# Patient Record
Sex: Female | Born: 1935 | Race: White | Hispanic: No | Marital: Married | Smoking: Never smoker
Health system: Southern US, Community
[De-identification: ages and names within clinical notes are randomized; demographics above are authoritative.]

## PROBLEM LIST (undated history)

## (undated) DIAGNOSIS — I10 Essential (primary) hypertension: Secondary | ICD-10-CM

## (undated) DIAGNOSIS — T8859XA Other complications of anesthesia, initial encounter: Secondary | ICD-10-CM

## (undated) DIAGNOSIS — E7800 Pure hypercholesterolemia, unspecified: Secondary | ICD-10-CM

## (undated) DIAGNOSIS — F329 Major depressive disorder, single episode, unspecified: Secondary | ICD-10-CM

## (undated) DIAGNOSIS — F419 Anxiety disorder, unspecified: Secondary | ICD-10-CM

## (undated) DIAGNOSIS — R112 Nausea with vomiting, unspecified: Secondary | ICD-10-CM

## (undated) DIAGNOSIS — E119 Type 2 diabetes mellitus without complications: Secondary | ICD-10-CM

## (undated) DIAGNOSIS — Z9889 Other specified postprocedural states: Secondary | ICD-10-CM

## (undated) DIAGNOSIS — N390 Urinary tract infection, site not specified: Secondary | ICD-10-CM

## (undated) DIAGNOSIS — M1990 Unspecified osteoarthritis, unspecified site: Secondary | ICD-10-CM

## (undated) DIAGNOSIS — T4145XA Adverse effect of unspecified anesthetic, initial encounter: Secondary | ICD-10-CM

## (undated) DIAGNOSIS — F32A Depression, unspecified: Secondary | ICD-10-CM

## (undated) HISTORY — DX: Essential (primary) hypertension: I10

## (undated) HISTORY — PX: TUMOR REMOVAL: SHX12

## (undated) HISTORY — DX: Pure hypercholesterolemia, unspecified: E78.00

## (undated) HISTORY — PX: HERNIA REPAIR: SHX51

## (undated) HISTORY — PX: TOTAL ABDOMINAL HYSTERECTOMY: SHX209

## (undated) HISTORY — DX: Type 2 diabetes mellitus without complications: E11.9

## (undated) HISTORY — DX: Unspecified osteoarthritis, unspecified site: M19.90

---

## 1998-12-20 ENCOUNTER — Encounter: Admission: RE | Admit: 1998-12-20 | Discharge: 1998-12-20 | Payer: Self-pay | Admitting: Family Medicine

## 1998-12-20 ENCOUNTER — Encounter: Payer: Self-pay | Admitting: Family Medicine

## 1998-12-21 ENCOUNTER — Observation Stay (HOSPITAL_COMMUNITY): Admission: EM | Admit: 1998-12-21 | Discharge: 1998-12-22 | Payer: Self-pay

## 1998-12-21 ENCOUNTER — Encounter: Payer: Self-pay | Admitting: Anesthesiology

## 1999-01-19 ENCOUNTER — Encounter: Admission: RE | Admit: 1999-01-19 | Discharge: 1999-01-19 | Payer: Self-pay | Admitting: Family Medicine

## 1999-01-19 ENCOUNTER — Encounter: Payer: Self-pay | Admitting: Family Medicine

## 2000-04-11 ENCOUNTER — Encounter: Payer: Self-pay | Admitting: Family Medicine

## 2000-04-11 ENCOUNTER — Encounter: Admission: RE | Admit: 2000-04-11 | Discharge: 2000-04-11 | Payer: Self-pay | Admitting: Family Medicine

## 2000-07-04 ENCOUNTER — Encounter (INDEPENDENT_AMBULATORY_CARE_PROVIDER_SITE_OTHER): Payer: Self-pay | Admitting: Specialist

## 2000-07-04 ENCOUNTER — Ambulatory Visit (HOSPITAL_COMMUNITY): Admission: RE | Admit: 2000-07-04 | Discharge: 2000-07-04 | Payer: Self-pay | Admitting: *Deleted

## 2003-07-20 ENCOUNTER — Encounter: Admission: RE | Admit: 2003-07-20 | Discharge: 2003-07-20 | Payer: Self-pay | Admitting: Family Medicine

## 2003-12-09 ENCOUNTER — Ambulatory Visit: Payer: Self-pay | Admitting: Oncology

## 2005-12-11 ENCOUNTER — Ambulatory Visit (HOSPITAL_COMMUNITY): Admission: RE | Admit: 2005-12-11 | Discharge: 2005-12-11 | Payer: Self-pay | Admitting: Family Medicine

## 2005-12-11 ENCOUNTER — Encounter: Payer: Self-pay | Admitting: Vascular Surgery

## 2007-10-06 ENCOUNTER — Encounter: Admission: RE | Admit: 2007-10-06 | Discharge: 2007-10-06 | Payer: Self-pay | Admitting: Family Medicine

## 2007-10-09 ENCOUNTER — Encounter: Admission: RE | Admit: 2007-10-09 | Discharge: 2007-10-09 | Payer: Self-pay | Admitting: Family Medicine

## 2012-10-23 ENCOUNTER — Encounter: Payer: Self-pay | Admitting: Internal Medicine

## 2012-10-23 ENCOUNTER — Ambulatory Visit (INDEPENDENT_AMBULATORY_CARE_PROVIDER_SITE_OTHER): Payer: Medicare Other | Admitting: Internal Medicine

## 2012-10-23 VITALS — BP 132/82 | HR 111 | Temp 98.4°F | Ht 62.0 in | Wt 235.8 lb

## 2012-10-23 DIAGNOSIS — R05 Cough: Secondary | ICD-10-CM

## 2012-10-23 DIAGNOSIS — R918 Other nonspecific abnormal finding of lung field: Secondary | ICD-10-CM

## 2012-10-23 NOTE — Patient Instructions (Addendum)
#  CHronic cough  - use  take generic fluticasone inhaler 2 squirts each nostril daily -continue allergy pill - try 3% nasal saline OTC Spray each nostril at night - take OTC prilosec 20mg  once daily - do PFT breathing test at followup   - followup will do CAT Score   # Lung nodules  - need to look at ct 08/10/12 - find it and bring it with you at next vsiit - have CT chest wo contrast within our system OCtober 1st week 2014  #FOllowup PFT  inOCT 2014 CT chest in Oct 2014 Followup after  PFT and CT chest  - cough score at followup

## 2012-10-23 NOTE — Progress Notes (Signed)
Subjective:    Patient ID: Melissa Dalton, female    DOB: January 23, 1935, 78 y.o.   MRN: [id number] PCP Eartha Inch, MD  HPI IOV 10/23/2012   Multiple pulmonary nodules but also has chronic cough so we're dressing 2 problems   Multiple pulmonary nodules  - In July 2014 had abdominal bloating. She then underwent an abdominal scan that showed ventral hernia but also some pulmonary nodules in the base of the lung according to her history. This was followed by a CT scan of the chest at [facility] and apparently this showed nodules in the lung and there for she's been referred here. Patient left the CD-ROM of the CT scan at home. Therefore we didn't have any imaging data available today. I only have the report. The CT scan was done 08/10/2012 and interpreted by Dr. Dagoberto Reef at [facility]. Report shows numerous bilateral lung parenchymal nodules. Says the vast majority of the nodules are within the lung base and a new compared to 2006 CT scan of the chest. The largest nodule was 0.9 cm in size in the right lung base. Rest of the nodules are less than 6 mm in size. Radiologist is concerned about metastatic disease versus granulomatous disease. Radiologst noted that there was no mediastinal or hilar lymphadenopathy. In addition there is coronary atherosclerosis.     Chronic cough  - Insidious onset 30 years ago. Stable since onset. Not progressive. Usually mild but occasionally moderate to severe. Doesn't both during day and night. Largely dry cough in quality but occasionally has white mucus. Has associated postnasal drainage for which he takes daily allergy pill. She also has a constant feeling of ticklishness in her right throat. There is associated gag  Relevant history for cough   - Allergies: She would have spring and sinus allergies. Unclear she's been allergy tested  -Reflux disease: She denies any acid reflux disease. She has been seen by Dr. Loreta Ave abnormal bloating and  this had endoscopy in August 2014 apparently and this is normal.  - Respiratory history: Lifelong exposed to passive tobacco smoke because her husband smoked from [place] to 2001. But she does not have any active shortness of breath or wheezing    Dr Gretta Cool Reflux Symptom Index (> 13-15 suggestive of LPR cough) 0 -> 5  =  none ->severe problem 10/23/2012   Hoarseness of problem with voice 0  Clearing  Of Throat 3  Excess throat mucus or feeling of post nasal drip 4  Difficulty swallowing food, liquid or tablets 2  Cough after eating or lying down 4  Breathing difficulties or choking episodes 0  Troublesome or annoying cough 4  Sensation of something sticking in throat or lump in throat 2  Heartburn, chest pain, indigestion, or stomach acid coming up 3  TOTAL 22       Kouffman Reflux v Neurogenic Cough Differentiator Reflux 10/23/2012   Do you awaken from a sound sleep coughing violently?                            With trouble breathing? Yes  Do you have choking episodes when you cannot  Get enough air, gasping for air ?              No  Do you usually cough when you lie down into  The bed, or when you just lie down to rest ?  Yes  Do you usually cough after meals or eating?         Yes  Do you cough when (or after) you bend over?    no  GERD SCORE  3  Kouffman Reflux v Neurogenic Cough Differentiator Neurogenic  Do you more-or-less cough all day long? yes  Does change of temperature make you cough? no  Does laughing or chuckling cause you to cough? y  Do fumes (perfume, automobile fumes, burned  Toast, etc.,) cause you to cough ?      y  Does speaking, singing, or talking on the phone cause you to cough   ?               y  Neurogenic/Airway score 4      Past Medical History  Diagnosis Date  . HTN (hypertension)   . Diabetes   . High cholesterol   . Arthritis      Family History  Problem Relation Age of Onset  . Heart disease Father   .  Diabetes Mother      History   Social History  . Marital Status: Married    Spouse Name: N/A    Number of Children: N/A  . Years of Education: N/A   Occupational History  . retired Neurosurgeon    Social History Main Topics  . Smoking status: Never Smoker   . Smokeless tobacco: Not on file  . Alcohol Use: No  . Drug Use: No  . Sexual Activity: Not on file   Other Topics Concern  . Not on file   Social History Narrative  . No narrative on file     Allergies  Allergen Reactions  . Penicillins Hives  . Tramadol Nausea And Vomiting     No outpatient prescriptions prior to visit.   No facility-administered medications prior to visit.       Review of Systems  Constitutional: Negative for fever and unexpected weight change.  HENT: Negative for ear pain, nosebleeds, congestion, sore throat, rhinorrhea, sneezing, trouble swallowing, dental problem, postnasal drip and sinus pressure.   Eyes: Negative for redness and itching.  Respiratory: Negative for cough, chest tightness, shortness of breath and wheezing.   Cardiovascular: Negative for palpitations and leg swelling.  Gastrointestinal: Negative for nausea and vomiting.  Genitourinary: Negative for dysuria.  Musculoskeletal: Negative for joint swelling.  Skin: Negative for rash.  Neurological: Negative for headaches.  Hematological: Does not bruise/bleed easily.  Psychiatric/Behavioral: Negative for dysphoric mood. The patient is not nervous/anxious.        Objective:   Physical Exam  Vitals reviewed. Constitutional: She is oriented to person, place, and time. She appears well-developed and well-nourished. No distress.  Obese Body mass index is 43.12 kg/(m^2). Uses cane  HENT:  Head: Normocephalic and atraumatic.  Right Ear: External ear normal.  Left Ear: External ear normal.  Mouth/Throat: Oropharynx is clear and moist. No oropharyngeal exudate.  Eyes: Conjunctivae and EOM are normal. Pupils are equal,  round, and reactive to light. Right eye exhibits no discharge. Left eye exhibits no discharge. No scleral icterus.  Neck: Normal range of motion. Neck supple. No JVD present. No tracheal deviation present. No thyromegaly present.  Cardiovascular: Normal rate, regular rhythm, normal heart sounds and intact distal pulses.  Exam reveals no gallop and no friction rub.   No murmur heard. Pulmonary/Chest: Effort normal. No respiratory distress. She has no wheezes. She has rales. She exhibits no tenderness.  ? Crackles at base  Abdominal:  Soft. Bowel sounds are normal. She exhibits no distension and no mass. There is no tenderness. There is no rebound and no guarding.  Musculoskeletal: Normal range of motion. She exhibits no edema and no tenderness.  Lymphadenopathy:    She has no cervical adenopathy.  Neurological: She is alert and oriented to person, place, and time. She has normal reflexes. No cranial nerve deficit. She exhibits normal muscle tone. Coordination normal.  Skin: Skin is warm and dry. No rash noted. She is not diaphoretic. No erythema. No pallor.  Psychiatric: She has a normal mood and affect. Her behavior is normal. Judgment and thought content normal.          Assessment & Plan:

## 2012-10-25 DIAGNOSIS — R918 Other nonspecific abnormal finding of lung field: Secondary | ICD-10-CM | POA: Insufficient documentation

## 2012-10-25 DIAGNOSIS — R05 Cough: Secondary | ICD-10-CM | POA: Insufficient documentation

## 2012-10-25 NOTE — Assessment & Plan Note (Addendum)
#   Lung nodules  - need to look at ct 08/10/12 - find it and bring it with you at next vsiit - have CT chest wo contrast within our system OCtober 1st week 2014; this will beh followup scan for the nodule because prior one reported to have 9mm nodule

## 2012-10-25 NOTE — Assessment & Plan Note (Signed)
#  CHronic cough  - use  take generic fluticasone inhaler 2 squirts each nostril daily -continue allergy pill - try 3% nasal saline OTC Spray each nostril at night - take OTC prilosec 20mg  once daily - do PFT breathing test at followup   - followup will do CAT Score    #FOllowup PFT  inOCT 2014 CT chest in Oct 2014 Followup after  PFT and CT chest  - cough score at followup

## 2012-11-10 ENCOUNTER — Telehealth: Payer: Self-pay | Admitting: Internal Medicine

## 2012-11-10 ENCOUNTER — Ambulatory Visit (INDEPENDENT_AMBULATORY_CARE_PROVIDER_SITE_OTHER)
Admission: RE | Admit: 2012-11-10 | Discharge: 2012-11-10 | Disposition: A | Discharge status: Home / Self Care | Payer: Medicare Other | Source: Ambulatory Visit | Attending: Internal Medicine | Admitting: Internal Medicine

## 2012-11-10 DIAGNOSIS — R053 Chronic cough: Secondary | ICD-10-CM

## 2012-11-10 DIAGNOSIS — R918 Other nonspecific abnormal finding of lung field: Secondary | ICD-10-CM

## 2012-11-10 DIAGNOSIS — R05 Cough: Secondary | ICD-10-CM

## 2012-11-10 NOTE — Telephone Encounter (Signed)
Dr Llana Aliment called. He wants the CD Rom from [place] that the patient left behind when she came to see me. Have her bring it in so I can take it to him. Let me know when done  Thanks  Dr. Kalman Shan, M.D., [facility].C.P Pulmonary and Critical Care Medicine Staff Physician [place] [facility] [place] [facility] Critical [facility] Pager: [phone], If no answer or between  15:00h - 7:00h: call [phone]  11/10/2012 9:57 AM

## 2012-11-11 NOTE — Telephone Encounter (Signed)
Pt brought CD-rom by the office. I have them with me so they do not get lost. Let me know what you would like me to do with them. Thanks!

## 2012-11-11 NOTE — Telephone Encounter (Signed)
I spoke with the pt and she states she does not know when she will be able to come into [place]. She states she lives in [place] and that is far away and she does not come into [place] often. I advised of the importance to bring this so the doctor can properly treat her. I asked the pt to call me when she thinks she can bring it by. She could not give me an estimated date. Carron Curie, CMA

## 2012-11-12 NOTE — Telephone Encounter (Signed)
I placed it there. Carron Curie, CMA

## 2012-11-12 NOTE — Telephone Encounter (Signed)
Please leave it on top of the computer on wheels   Dr. Kalman Shan, M.D., [facility].C.P Pulmonary and Critical Care Medicine Staff Physician [place] [facility] [place] [facility]monary and Critical Care Pager: [phone], If no answer or between  15:00h - 7:00h: call [phone]  11/12/2012 8:18 AM

## 2012-12-04 ENCOUNTER — Telehealth: Payer: Self-pay | Admitting: Internal Medicine

## 2012-12-04 NOTE — Telephone Encounter (Signed)
I have her out of town CD on the [facility]. DR ENtrikin told me that system can upload. COuld you (in my time off) get it over to [facility] and have them up load it. Can you or [facility] do that? LEt me know  Thanks  Dr. Kalman Shan, M.D., [facility].C.P Pulmonary and Critical Care Medicine Staff Physician [place] [facility] [place] [facility]monary and Critical Care Pager: [phone], If no answer or between  15:00h - 7:00h: call [phone]  12/04/2012 2:54 PM

## 2012-12-07 ENCOUNTER — Ambulatory Visit: Payer: Medicare Other | Admitting: Internal Medicine

## 2012-12-14 ENCOUNTER — Telehealth: Payer: Self-pay | Admitting: Internal Medicine

## 2012-12-14 ENCOUNTER — Inpatient Hospital Stay
Admission: RE | Admit: 2012-12-14 | Discharge: 2012-12-14 | Disposition: A | Discharge status: Home / Self Care | Payer: Self-pay | Source: Ambulatory Visit | Attending: Internal Medicine | Admitting: Internal Medicine

## 2012-12-14 ENCOUNTER — Other Ambulatory Visit: Payer: Self-pay | Admitting: Internal Medicine

## 2012-12-14 DIAGNOSIS — I10 Essential (primary) hypertension: Secondary | ICD-10-CM

## 2012-12-14 NOTE — Telephone Encounter (Signed)
Spoke with Melissa Dalton. Per Victorino Dike, we would like to have the CD back. Advised that one of [place] will come by and get it. Nothing further is needed at this time.

## 2012-12-14 NOTE — Telephone Encounter (Signed)
I took CD to [facility] today. Carron Curie, CMA

## 2012-12-17 ENCOUNTER — Encounter: Payer: Self-pay | Admitting: Internal Medicine

## 2012-12-17 ENCOUNTER — Ambulatory Visit (INDEPENDENT_AMBULATORY_CARE_PROVIDER_SITE_OTHER): Payer: Medicare Other | Admitting: Internal Medicine

## 2012-12-17 VITALS — BP 132/82 | HR 112 | Temp 98.0°F | Ht 61.0 in | Wt 238.0 lb

## 2012-12-17 DIAGNOSIS — I2584 Coronary atherosclerosis due to calcified coronary lesion: Secondary | ICD-10-CM

## 2012-12-17 DIAGNOSIS — R05 Cough: Secondary | ICD-10-CM

## 2012-12-17 DIAGNOSIS — R053 Chronic cough: Secondary | ICD-10-CM

## 2012-12-17 DIAGNOSIS — I2510 Atherosclerotic heart disease of native coronary artery without angina pectoris: Secondary | ICD-10-CM

## 2012-12-17 DIAGNOSIS — R918 Other nonspecific abnormal finding of lung field: Secondary | ICD-10-CM

## 2012-12-17 LAB — PULMONARY FUNCTION TEST

## 2012-12-17 NOTE — Progress Notes (Signed)
Subjective:    Patient ID: Melissa Dalton, female    DOB: 1935/02/01, 78 y.o.   MRN: [id number]  HPI  PCP BADGER,MICHAEL C, MD  HPI IOV 10/23/2012   Multiple pulmonary nodules but also has chronic cough so we're dressing 2 problems   Multiple pulmonary nodules  - In July 2014 had abdominal bloating. She then underwent an abdominal scan that showed ventral hernia but also some pulmonary nodules in the base of the lung according to her history. This was followed by a CT scan of the chest at [facility] and apparently this showed nodules in the lung and there for she's been referred here. Patient left the CD-ROM of the CT scan at home. Therefore we didn't have any imaging data available today. I only have the report. The CT scan was done 08/10/2012 and interpreted by Dr. Dagoberto Reef at [facility]. Report shows numerous bilateral lung parenchymal nodules. Says the vast majority of the nodules are within the lung base and a new compared to 2006 CT scan of the chest. The largest nodule was 0.9 cm in size in the right lung base. Rest of the nodules are less than 6 mm in size. Radiologist is concerned about metastatic disease versus granulomatous disease. Radiologst noted that there was no mediastinal or hilar lymphadenopathy. In addition there is coronary atherosclerosis.     Chronic cough  - Insidious onset 30 years ago. Stable since onset. Not progressive. Usually mild but occasionally moderate to severe. Doesn't both during day and night. Largely dry cough in quality but occasionally has white mucus. Has associated postnasal drainage for which he takes daily allergy pill. She also has a constant feeling of ticklishness in her right throat. There is associated gag. RSI cough score is 22 suggesting irritable larynx syndrome. Cough differentiator score is highfor both acid reflux and airway/neurogenic cough  Relevant history for cough   - Allergies: She would have spring and  sinus allergies. Unclear she's been allergy tested  -Reflux disease: She denies any acid reflux disease. She has been seen by Dr. Loreta Ave abnormal bloating and this had endoscopy in August 2014 apparently and this is normal.  - Respiratory history: Lifelong exposed to passive tobacco smoke because her husband smoked from [place] to 2001. But she does not have any active shortness of breath or wheezing    OV 12/17/2012  Followup chronic cough.   Last visit we added sinus drainage treatment with nasal steroid and 3% hypertonic saline. In addition added acid reflux treatment with Prilosec. However, despite this she is no better. Cough is unchanged. Cough is typically worse at night when she lies down.  We have additional test results.   Pulmonary function test 12/17/2012 - normal except restrictio due to obesity shows FVC of 2.17 L/89%. FEV1 of 1.8 L/100%. Now broken a letter response. Ratio of 83 and normal. Total lung capacity 3.25 L/70%. Diffusion capacity 18.36/90. Essentially normal pulmonary function test except for mild reduction in total lung capacity suggestive of restriction Body mass index is 44.99 kg/(m^2).    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 - Coronary artery calcification -  Hepatic steatosis    Dr Gretta Cool Reflux Symptom Index (> 13-15 suggestive of LPR cough) 0 -> 5  =  none ->severe problem 10/23/2012   Hoarseness of problem with voice 0  Clearing  Of Throat 3  Excess throat mucus  or feeling of post nasal drip 4  Difficulty swallowing food, liquid or tablets 2  Cough after eating or lying down 4  Breathing difficulties or choking episodes 0  Troublesome or annoying cough 4  Sensation of something sticking in throat or lump in throat 2  Heartburn, chest pain, indigestion, or stomach acid coming up 3  TOTAL 22       Kouffman Reflux v Neurogenic Cough  Differentiator Reflux 10/23/2012   Do you awaken from a sound sleep coughing violently?                            With trouble breathing? Yes  Do you have choking episodes when you cannot  Get enough air, gasping for air ?              No  Do you usually cough when you lie down into  The bed, or when you just lie down to rest ?                          Yes  Do you usually cough after meals or eating?         Yes  Do you cough when (or after) you bend over?    no  GERD SCORE  3  Kouffman Reflux v Neurogenic Cough Differentiator Neurogenic  Do you more-or-less cough all day long? yes  Does change of temperature make you cough? no  Does laughing or chuckling cause you to cough? y  Do fumes (perfume, automobile fumes, burned  Toast, etc.,) cause you to cough ?      y  Does speaking, singing, or talking on the phone cause you to cough   ?               y  Neurogenic/Airway score 4        Review of Systems  Constitutional: Negative for fever and unexpected weight change.  HENT: Negative for congestion, dental problem, ear pain, nosebleeds, postnasal drip, rhinorrhea, sinus pressure, sneezing, sore throat and trouble swallowing.   Eyes: Negative for redness and itching.  Respiratory: Positive for cough. Negative for chest tightness, shortness of breath and wheezing.   Cardiovascular: Negative for palpitations and leg swelling.  Gastrointestinal: Negative for nausea and vomiting.  Genitourinary: Negative for dysuria.  Musculoskeletal: Negative for joint swelling.  Skin: Negative for rash.  Neurological: Negative for headaches.  Hematological: Does not bruise/bleed easily.  Psychiatric/Behavioral: Negative for dysphoric mood. The patient is not nervous/anxious.        Objective:   Physical Exam  Vitals reviewed. Constitutional: She is oriented to person, place, and time. She appears well-developed and well-nourished. No distress.  Obese Has cane  HENT:  Head: Normocephalic and  atraumatic.  Right Ear: External ear normal.  Left Ear: External ear normal.  Mouth/Throat: Oropharynx is clear and moist. No oropharyngeal exudate.  Eyes: Conjunctivae and EOM are normal. Pupils are equal, round, and reactive to light. Right eye exhibits no discharge. Left eye exhibits no discharge. No scleral icterus.  Neck: Normal range of motion. Neck supple. No JVD present. No tracheal deviation present. No thyromegaly present.  Cardiovascular: Normal rate, regular rhythm, normal heart sounds and intact distal pulses.  Exam reveals no gallop and no friction rub.   No murmur heard. Pulmonary/Chest: Effort normal and breath sounds normal. No respiratory distress. She has no wheezes. She has no rales. She  exhibits no tenderness.  Abdominal: Soft. Bowel sounds are normal. She exhibits no distension and no mass. There is no tenderness. There is no rebound and no guarding.  Musculoskeletal: Normal range of motion. She exhibits no edema and no tenderness.  Lymphadenopathy:    She has no cervical adenopathy.  Neurological: She is alert and oriented to person, place, and time. She has normal reflexes. No cranial nerve deficit. She exhibits normal muscle tone. Coordination normal.  Skin: Skin is warm and dry. No rash noted. She is not diaphoretic. No erythema. No pallor.  Psychiatric: She has a normal mood and affect. Her behavior is normal. Judgment and thought content normal.          Assessment & Plan:

## 2012-12-17 NOTE — Patient Instructions (Addendum)
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 Only  Dr Leanna Battles or Dr. Trudie Reed to read   #Co Art Calcification  - appreciate you telling me you are overwhelmed and want to hold off cardiology evaluation for now  #FOllowup 6 weeks  cough score at followup

## 2012-12-17 NOTE — Progress Notes (Signed)
PFT done today. 

## 2012-12-20 DIAGNOSIS — I2510 Atherosclerotic heart disease of native coronary artery without angina pectoris: Secondary | ICD-10-CM | POA: Insufficient documentation

## 2012-12-20 NOTE — Assessment & Plan Note (Signed)
 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 Only  Dr Leanna Battles or Dr. Trudie Reed to read   #FOllowup 6 weeks  cough score at followup

## 2012-12-20 NOTE — Assessment & Plan Note (Signed)
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

## 2012-12-20 NOTE — Assessment & Plan Note (Addendum)
 #  Co Art Calcification  - appreciate you telling me you are overwhelmed and want to hold off cardiology evaluation for now  #FOllowup 6 weeks  cough score at followup

## 2013-02-08 ENCOUNTER — Encounter: Payer: Self-pay | Admitting: Internal Medicine

## 2013-02-08 ENCOUNTER — Ambulatory Visit (INDEPENDENT_AMBULATORY_CARE_PROVIDER_SITE_OTHER): Payer: Medicare Other | Admitting: Internal Medicine

## 2013-02-08 VITALS — BP 130/82 | HR 99 | Ht 61.0 in | Wt 242.0 lb

## 2013-02-08 DIAGNOSIS — R059 Cough, unspecified: Secondary | ICD-10-CM

## 2013-02-08 DIAGNOSIS — R053 Chronic cough: Secondary | ICD-10-CM

## 2013-02-08 DIAGNOSIS — R05 Cough: Secondary | ICD-10-CM

## 2013-02-08 MED ORDER — RANITIDINE HCL 300 MG PO CAPS: 300.0000 mg | ORAL_CAPSULE | Freq: Every evening | ORAL | Status: DC

## 2013-02-08 MED ORDER — PANTOPRAZOLE SODIUM 40 MG PO TBEC: 40.0000 mg | DELAYED_RELEASE_TABLET | Freq: Every day | ORAL | Status: DC

## 2013-02-08 MED ORDER — VALSARTAN 80 MG PO TABS: 80.0000 mg | ORAL_TABLET | Freq: Every day | ORAL | Status: DC

## 2013-02-08 NOTE — Patient Instructions (Addendum)
#  CHronic cough - multiple reasons + o a rare condition called DIPNECH; seen as nodules in lung - too bad ytou are not any better - respect your desire to avoid further testing or adding more new medications PLAN - continue AEROSPAN, 2 puff twice daily  - continue  generic fluticasone inhaler 2 squirts each nostril daily -continue allergy pill - contiue 3% nasal saline OTC Spray each nostril at night - CHANGE OTC prilosec 20mg  once daily to [place] 40mg  daily  - take on empty stomach at day time - START RANITIDINE 300mg  once a day at bedtime - CHANGE LOSARTAN BP pill to VALSARTAN 80mg  once a day  - monitor your BP - Respect your desire to holdd of speech Rx and neurontin for cough - Respect your desire to hold off ENT, surgical lung bx referral - followup do Cough score - part 1  # Lung nodules  -  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 Only  Dr Lorin Picket or Dr. Vinnie Langton to read   #Co Art Calcification  - appreciate you telling me you that you continue to fell overwhelmed and want to hold off cardiology evaluation for now  #FOllowup 6 weeks  cough score at followup

## 2013-02-08 NOTE — Assessment & Plan Note (Signed)
#  CHronic cough - multiple reasons + o a rare condition called DIPNECH; seen as nodules in lung - too bad ytou are not any better - respect your desire to avoid further testing or adding more new medications PLAN - continue AEROSPAN, 2 puff twice daily  - continue  generic fluticasone inhaler 2 squirts each nostril daily -continue allergy pill - contiue 3% nasal saline OTC Spray each nostril at night - CHANGE OTC prilosec 20mg  once daily to [place] 40mg  daily  - take on empty stomach at day time - START RANITIDINE 300mg  once a day at bedtime - CHANGE LOSARTAN BP pill to VALSARTAN 80mg  once a day  - monitor your BP - Respect your desire to holdd of speech Rx and neurontin for cough - Respect your desire to hold off ENT, surgical lung bx referral - followup do Cough score - part 1  # Lung nodules  -  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 Only  Dr Lorin Picket or Dr. Vinnie Langton to read   #Co Art Calcification  - appreciate you telling me you that you continue to fell overwhelmed and want to hold off cardiology evaluation for now  #FOllowup 6 weeks  cough score at followup  > 50% of this > 25 min visit spent in face to face counseling (15 min visit converted to 25 min)

## 2013-02-08 NOTE — Progress Notes (Signed)
Subjective:    Patient ID: Melissa Dalton, female    DOB: [birth year], 79 y.o.   MRN: [id number]  HPI  PCP BADGER,MICHAEL C, MD  HPI IOV 10/23/2012   Multiple pulmonary nodules but also has chronic cough so we're dressing 2 problems   Multiple pulmonary nodules  - In July 2014 had abdominal bloating. She then underwent an abdominal scan that showed ventral hernia but also some pulmonary nodules in the base of the lung according to her history. This was followed by a CT scan of the chest at [facility] and apparently this showed nodules in the lung and there for she's been referred here. Patient left the CD-ROM of the CT scan at home. Therefore we didn't have any imaging data available today. I only have the report. The CT scan was done 08/10/2012 and interpreted by Dr. Ophelia Charter at [facility]. Report shows numerous bilateral lung parenchymal nodules. Says the vast majority of the nodules are within the lung base and a new compared to 2006 CT scan of the chest. The largest nodule was 0.9 cm in size in the right lung base. Rest of the nodules are less than 6 mm in size. Radiologist is concerned about metastatic disease versus granulomatous disease. Radiologst noted that there was no mediastinal or hilar lymphadenopathy. In addition there is coronary atherosclerosis.     Chronic cough  - Insidious onset 30 years ago. Stable since onset. Not progressive. Usually mild but occasionally moderate to severe. Doesn't both during day and night. Largely dry cough in quality but occasionally has white mucus. Has associated postnasal drainage for which he takes daily allergy pill. She also has a constant feeling of ticklishness in her right throat. There is associated gag. RSI cough score is 22 suggesting irritable larynx syndrome. Cough differentiator score is highfor both acid reflux and airway/neurogenic cough  Relevant history for cough   - Allergies: She would have spring and  sinus allergies. Unclear she's been allergy tested  -Reflux disease: She denies any acid reflux disease. She has been seen by Dr. Collene Mares abnormal bloating and this had endoscopy in August 2014 apparently and this is normal.  - Respiratory history: Lifelong exposed to passive tobacco smoke because her husband smoked from [place] to 2001. But she does not have any active shortness of breath or wheezing    OV 12/17/2012  Followup chronic cough.   Last visit we added sinus drainage treatment with nasal steroid and 3% hypertonic saline. In addition added acid reflux treatment with Prilosec. However, despite this she is no better. Cough is unchanged. Cough is typically worse at night when she lies down.  We have additional test results.   Pulmonary function test 12/17/2012 - normal except restrictio due to obesity shows FVC of 2.17 L/89%. FEV1 of 1.8 L/100%. Now broken a letter response. Ratio of 83 and normal. Total lung capacity 3.25 L/70%. Diffusion capacity 18.36/90. Essentially normal pulmonary function test except for mild reduction in total lung capacity suggestive of restriction Body mass index is 44.99 kg/(m^2).    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  Only  Dr Lorin Picket or Dr. Vinnie Langton to read   #Co Art Calcification  - appreciate you telling me you are overwhelmed and want to hold off cardiology evaluation for now  #FOllowup 6 weeks  cough score at followup   OV 02/08/2013  Fu chronic cough  - NO better. RSI cough score is 21 and unchanged. This is despite sinus, Gerd and oral ICS. She is against further testing. Says she would rather live with cough. She is againset speech Rx referral. SHe is against neurontin. Alll atlteast at this stge. She has refused flu shot. She is against surgical lung bx for nodules. She is open to changing GERD Rx. She is open to changing her ARB losartan to generid diovan    Dr Lorenza Cambridge Reflux Symptom Index (> 13-15 suggestive of LPR cough) 0 -> 5  =  none ->severe problem 10/23/2012  02/08/2013   Hoarseness of problem with voice 0 0  Clearing  Of Throat 3 4  Excess throat mucus or feeling of post nasal drip 4 4  Difficulty swallowing food, liquid or tablets 2 1  Cough after eating or lying down 4 4  Breathing difficulties or choking episodes 0 0  Troublesome or annoying cough 4 4  Sensation of something sticking in throat or lump in throat 2 1  Heartburn, chest pain, indigestion, or stomach acid coming up 3 3  TOTAL 22 21       Kouffman Reflux v Neurogenic Cough Differentiator Reflux 10/23/2012  02/08/2013   Do you awaken from a sound sleep coughing violently?                            With trouble breathing? Yes   Do you have choking episodes when you cannot  Get enough air, gasping for air ?              No   Do you usually cough when you lie down into  The bed, or when you just lie down to rest ?                          Yes   Do you usually cough after meals or eating?         Yes   Do you cough when (or after) you bend over?    no   GERD SCORE  3   Kouffman Reflux v Neurogenic Cough Differentiator Neurogenic   Do you more-or-less cough all day long? yes   Does change of  temperature make you cough? no   Does laughing or chuckling cause you to cough? y   Do fumes (perfume, automobile fumes, burned  Toast, etc.,) cause you to cough ?      y   Does speaking, singing, or talking on the phone cause you to cough   ?               y   Neurogenic/Airway score 4  Review of Systems  Constitutional: Negative for fever and unexpected weight change.  HENT: Negative for congestion, dental problem, ear pain, nosebleeds, postnasal drip, rhinorrhea, sinus pressure, sneezing, sore throat and trouble swallowing.   Eyes: Negative for redness and itching.  Respiratory: Positive for cough. Negative for chest tightness, shortness of breath and wheezing.   Cardiovascular: Negative for palpitations and leg swelling.  Gastrointestinal: Negative for nausea and vomiting.  Genitourinary: Negative for dysuria.  Musculoskeletal: Negative for joint swelling.  Skin: Negative for rash.  Neurological: Negative for headaches.  Hematological: Does not bruise/bleed easily.  Psychiatric/Behavioral: Negative for dysphoric mood. The patient is not nervous/anxious.        Objective:   Physical Exam Vitals reviewed. Constitutional: She is oriented to person, place, and time. She appears well-developed and well-nourished. No distress.  Obese Has cane  HENT:  Head: Normocephalic and atraumatic.  Right Ear: External ear normal.  Left Ear: External ear normal.  Mouth/Throat: Oropharynx is clear and moist. No oropharyngeal exudate.  Eyes: Conjunctivae and EOM are normal. Pupils are equal, round, and reactive to light. Right eye exhibits no discharge. Left eye exhibits no discharge. No scleral icterus.  Neck: Normal range of motion. Neck supple. No JVD present. No tracheal deviation present. No thyromegaly present.  Cardiovascular: Normal rate, regular rhythm, normal heart sounds and intact distal pulses.  Exam reveals no gallop and no friction rub.   No murmur  heard. Pulmonary/Chest: Effort normal and breath sounds normal. No respiratory distress. She has no wheezes. She has no rales. She exhibits no tenderness.  Abdominal: Soft. Bowel sounds are normal. She exhibits no distension and no mass. There is no tenderness. There is no rebound and no guarding.  Musculoskeletal: Normal range of motion. She exhibits no edema and no tenderness.  Lymphadenopathy:    She has no cervical adenopathy.  Neurological: She is alert and oriented to person, place, and time. She has normal reflexes. No cranial nerve deficit. She exhibits normal muscle tone. Coordination normal.  Skin: Skin is warm and dry. No rash noted. She is not diaphoretic. No erythema. No pallor.  Psychiatric: She has a normal mood and affect. Her behavior is normal. Judgment and thought content normal.          Assessment & Plan:

## 2013-03-23 ENCOUNTER — Ambulatory Visit: Payer: Medicare Other | Admitting: Internal Medicine

## 2013-04-23 ENCOUNTER — Encounter: Payer: Self-pay | Admitting: Internal Medicine

## 2013-04-23 ENCOUNTER — Ambulatory Visit (INDEPENDENT_AMBULATORY_CARE_PROVIDER_SITE_OTHER): Payer: Medicare Other | Admitting: Internal Medicine

## 2013-04-23 VITALS — BP 128/82 | HR 100 | Ht 61.0 in | Wt 242.2 lb

## 2013-04-23 DIAGNOSIS — R059 Cough, unspecified: Secondary | ICD-10-CM

## 2013-04-23 DIAGNOSIS — R918 Other nonspecific abnormal finding of lung field: Secondary | ICD-10-CM

## 2013-04-23 DIAGNOSIS — R05 Cough: Secondary | ICD-10-CM

## 2013-04-23 DIAGNOSIS — I2510 Atherosclerotic heart disease of native coronary artery without angina pectoris: Secondary | ICD-10-CM

## 2013-04-23 DIAGNOSIS — R911 Solitary pulmonary nodule: Secondary | ICD-10-CM

## 2013-04-23 DIAGNOSIS — R053 Chronic cough: Secondary | ICD-10-CM

## 2013-04-23 DIAGNOSIS — I2584 Coronary atherosclerosis due to calcified coronary lesion: Secondary | ICD-10-CM

## 2013-04-23 NOTE — Assessment & Plan Note (Signed)
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; seen as nodules in lung - too bad you are not any better despite changes to bp med and acid reflux med  - respect your desire to avoid further testing or adding more new medications  PLAN - continue AEROSPAN, 2 puff twice daily  - continue  generic fluticasone inhaler 2 squirts each nostril daily -continue allergy pill - contiue 3% nasal saline OTC Spray each nostril at night - continue daily to [place] 40mg  daily  - take on empty stomach at day time - STOP  RANITIDINE 300mg  once a day at bedtime - Continue  VALSARTAN 80mg  once a day  - monitor your BP - Respect your desire to holdd of speech Rx and neurontin for cough - Respect your desire to hold off ENT, surgical lung bx referral - followup do Cough score - part 1  # Lung nodules  -  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 Only  Dr Lorin Picket or Dr. Vinnie Langton to read    Discussed and decided we will not address cough anymore

## 2013-04-23 NOTE — Patient Instructions (Addendum)
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; seen as nodules in lung - too bad you are not any better despite changes to bp med and acid reflux med  - respect your desire to avoid further testing or adding more new medications  PLAN - continue AEROSPAN, 2 puff twice daily  - continue  generic fluticasone inhaler 2 squirts each nostril daily -continue allergy pill - contiue 3% nasal saline OTC Spray each nostril at night - continue daily to [place] 40mg  daily  - take on empty stomach at day time - STOP  RANITIDINE 300mg  once a day at bedtime - Continue  VALSARTAN 80mg  once a day  - monitor your BP - Respect your desire to holdd of speech Rx and neurontin for cough - Respect your desire to hold off ENT, surgical lung bx referral - followup do Cough score - part 1  # Lung nodules  -  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 Only  Dr Lorin Picket or Dr. Vinnie Langton to read   #Co Art Calcification  - appreciate you telling me you that you continue to fell overwhelmed and want to hold off cardiology evaluation for now  #FOllowup July 2015 after CT chest for nodules

## 2013-04-23 NOTE — Assessment & Plan Note (Signed)
#  FOllowup July 2015 after CT chest for nodules

## 2013-04-23 NOTE — Progress Notes (Signed)
Subjective:    Patient ID: Melissa Dalton, female    DOB: [birth year], 79 y.o.   MRN: [id number]  HPI   PCP Chesley Noon, MD  HPI IOV 10/23/2012   Multiple pulmonary nodules but also has chronic cough so we're adressing 2 problems   Multiple pulmonary nodules  - In July 2014 had abdominal bloating. She then underwent an abdominal scan that showed ventral hernia but also some pulmonary nodules in the base of the lung according to her history. This was followed by a CT scan of the chest at [facility] and apparently this showed nodules in the lung and there for she's been referred here. Patient left the CD-ROM of the CT scan at home. Therefore we didn't have any imaging data available today. I only have the report. The CT scan was done 08/10/2012 and interpreted by Dr. Ophelia Charter at [facility]. Report shows numerous bilateral lung parenchymal nodules. Says the vast majority of the nodules are within the lung base and a new compared to 2006 CT scan of the chest. The largest nodule was 0.9 cm in size in the right lung base. Rest of the nodules are less than 6 mm in size. Radiologist is concerned about metastatic disease versus granulomatous disease. Radiologst noted that there was no mediastinal or hilar lymphadenopathy. In addition there is coronary atherosclerosis.     Chronic cough  - Insidious onset 30 years ago. Stable since onset. Not progressive. Usually mild but occasionally moderate to severe. Doesn't both during day and night. Largely dry cough in quality but occasionally has white mucus. Has associated postnasal drainage for which he takes daily allergy pill. She also has a constant feeling of ticklishness in her right throat. There is associated gag. RSI cough score is 22 suggesting irritable larynx syndrome. Cough differentiator score is highfor both acid reflux and airway/neurogenic cough  Relevant history for cough   - Allergies: She would have spring and  sinus allergies. Unclear she's been allergy tested  -Reflux disease: She denies any acid reflux disease. She has been seen by Dr. Collene Mares abnormal bloating and this had endoscopy in August 2014 apparently and this is normal.  - Respiratory history: Lifelong exposed to passive tobacco smoke because her husband smoked from [place] to 2001. But she does not have any active shortness of breath or wheezing    OV 12/17/2012  Followup chronic cough.   Last visit we added sinus drainage treatment with nasal steroid and 3% hypertonic saline. In addition added acid reflux treatment with Prilosec. However, despite this she is no better. Cough is unchanged. Cough is typically worse at night when she lies down.  We have additional test results.   Pulmonary function test 12/17/2012 - normal except restrictio due to obesity shows FVC of 2.17 L/89%. FEV1 of 1.8 L/100%. Now broken a letter response. Ratio of 83 and normal. Total lung capacity 3.25 L/70%. Diffusion capacity 18.36/90. Essentially normal pulmonary function test except for mild reduction in total lung capacity suggestive of restriction Body mass index is 44.99 kg/(m^2).    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  Only  Dr Lorin Picket or Dr. Vinnie Langton to read   #Co Art Calcification  - appreciate you telling me you are overwhelmed and want to hold off cardiology evaluation for now  #FOllowup 6 weeks  cough score at followup   OV 02/08/2013  Fu chronic cough  - NO better. RSI cough score is 21 and unchanged. This is despite sinus, Gerd and oral ICS. She is against further testing. Says she would rather live with cough. She is againset speech Rx referral. SHe is against neurontin.  She has refused flu shot. She is against surgical lung bx for nodules. She is open to changing GERD Rx. She is open to changing her ARB losartan to generid diovan  #CHronic cough - multiple reasons + o a rare condition called DIPNECH; seen as nodules in lung - too bad ytou are not any better - respect your desire to avoid further testing or adding more new medications  PLAN - continue AEROSPAN, 2 puff twice daily  - continue  generic fluticasone inhaler 2 squirts each nostril daily -continue allergy pill - contiue 3% nasal saline OTC Spray each nostril at night - CHANGE OTC prilosec 20mg  once daily to [place] 40mg  daily  - take on empty stomach at day time - START RANITIDINE 300mg  once a day at bedtime - CHANGE LOSARTAN BP pill to VALSARTAN 80mg  once a day  - monitor your BP - Respect your desire to holdd of speech Rx and neurontin for cough - Respect your desire to hold off ENT, surgical lung bx referral - followup do Cough score - part 1  # Lung nodules  -  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 Only  Dr Lorin Picket or Dr. Vinnie Langton to read   #Co Art Calcification  - appreciate you telling me you that you continue to fell overwhelmed and want to hold off cardiology evaluation for now  #FOllowup 6 weeks  cough score at  followup   [place] 04/23/2013  Chief Complaint  Patient presents with  . Cough    follow-up. Pt states cough is not any better.     Followup chronic cough  At last visit she is reluctant to adopt measures for irritable larynx syndrome of vocal cord "dysfunction as cause of cough. Namely she did not want that and speech therapy or try Neurontin or undergo surgical lung biopsy. She opted for changing her proton pump inhibitor, adding ranitidine for acid reflux. She also agreed to change her blood pressure pill to valsartan from losartan. In addition she continued Aerospan and sinus drainage measures. Despite this her cough has not improved. She states that she's had cough now for 35 years and would rather live with a cough that I doubt any new measures for treatment. She's not interested in surgical lung biopsy or Neurontin or speech therapy.  Past medical history reviewed 1 lung  nodules nine-month followup scan is pending July 2015 she is agreed for this #2. Coronary artery calcification she again declined cardiology evaluation for this  Dr Lorenza Cambridge Reflux Symptom Index (> 13-15 suggestive of LPR cough) 0 -> 5  =  none ->severe problem 10/23/2012  02/08/2013  04/23/2013   Hoarseness of problem with voice 0 0 0  Clearing  Of Throat 3 4 3   Excess throat mucus or feeling of post nasal drip 4 4 3   Difficulty swallowing food, liquid or tablets 2 1 1   Cough after eating or lying down 4 4 3   Breathing difficulties or choking episodes 0 0 0  Troublesome or annoying cough 4 4 3   Sensation of something sticking in throat or lump in throat 2 1 1   Heartburn, chest pain, indigestion, or stomach acid coming up 3 3 3   TOTAL 22 21 15              Review of Systems  Constitutional: Negative for fever and unexpected weight change.  HENT: Negative for congestion, dental problem, ear pain, nosebleeds, postnasal drip, rhinorrhea, sinus pressure, sneezing, sore throat and trouble swallowing.   Eyes:  Negative for redness and itching.  Respiratory: Positive for cough. Negative for chest tightness, shortness of breath and wheezing.   Cardiovascular: Negative for palpitations and leg swelling.  Gastrointestinal: Negative for nausea and vomiting.  Genitourinary: Negative for dysuria.  Musculoskeletal: Negative for joint swelling.  Skin: Negative for rash.  Neurological: Negative for headaches.  Hematological: Does not bruise/bleed easily.  Psychiatric/Behavioral: Negative for dysphoric mood. The patient is not nervous/anxious.        Objective:   Physical Exam  Vitals reviewed. Constitutional: She is oriented to person, place, and time. She appears well-developed and well-nourished. No distress.  Body mass index is 45.79 kg/(m^2).   HENT:  Head: Normocephalic and atraumatic.  Right Ear: External ear normal.  Left Ear: External ear normal.  Mouth/Throat: Oropharynx is clear and moist. No oropharyngeal exudate.  Eyes: Conjunctivae and EOM are normal. Pupils are equal, round, and reactive to light. Right eye exhibits no discharge. Left eye exhibits no discharge. No scleral icterus.  Neck: Normal range of motion. Neck supple. No JVD present. No tracheal deviation present. No thyromegaly present.  Cardiovascular: Normal rate, regular rhythm, normal heart sounds and intact distal pulses.  Exam reveals no gallop and no friction rub.   No murmur heard. Pulmonary/Chest: Effort normal. No respiratory distress. She has no wheezes. She has rales. She exhibits no tenderness.  Bilateral LL rales  Abdominal: Soft. Bowel sounds are normal. She exhibits no distension and no mass. There is no tenderness. There is no rebound and no guarding.  Musculoskeletal: Normal range of motion. She exhibits no edema and no tenderness.  DJD  Lymphadenopathy:    She has no cervical adenopathy.  Neurological: She is alert and oriented to person, place, and time. She has normal reflexes. No cranial nerve deficit.  She exhibits normal muscle tone. Coordination normal.  Skin: Skin is warm and dry. No rash noted. She is not diaphoretic. No erythema.  Psychiatric: She has a normal mood and affect. Her behavior is normal. Judgment and thought content normal.   Filed Vitals:   04/23/13 0858  BP: 128/82  Pulse: 100  Height: 5\' 1"  (1.549 m)  Weight: 242 lb 3.2 oz (109.861 kg)  SpO2: 95%          Assessment & Plan:

## 2013-04-23 NOTE — Assessment & Plan Note (Signed)
#  Co Art Calcification  - appreciate you telling me you that you continue to fell overwhelmed and want to hold off cardiology evaluation for now

## 2013-10-04 ENCOUNTER — Other Ambulatory Visit: Payer: Medicare Other

## 2013-10-07 ENCOUNTER — Ambulatory Visit (INDEPENDENT_AMBULATORY_CARE_PROVIDER_SITE_OTHER)
Admission: RE | Admit: 2013-10-07 | Discharge: 2013-10-07 | Disposition: A | Discharge status: Home / Self Care | Payer: Medicare Other | Source: Ambulatory Visit | Attending: Internal Medicine | Admitting: Internal Medicine

## 2013-10-07 DIAGNOSIS — R911 Solitary pulmonary nodule: Secondary | ICD-10-CM

## 2013-10-15 ENCOUNTER — Other Ambulatory Visit: Payer: Self-pay | Admitting: Internal Medicine

## 2013-10-17 ENCOUNTER — Telehealth: Payer: Self-pay | Admitting: Internal Medicine

## 2013-10-17 NOTE — Telephone Encounter (Signed)
Got letter from insurance saying that pharmacy claims record suggest she is not taking her valsartan regularly. Please check with her.  Thanks  Dr. Brand Males, M.D., [facility].C.P Pulmonary and Critical Care Medicine Staff Physician [place] [facility] Pager: [phone], If no answer or between  15:00h - 7:00h: call [phone]  10/17/2013 10:11 PM

## 2013-10-19 ENCOUNTER — Other Ambulatory Visit: Payer: Self-pay | Admitting: Internal Medicine

## 2013-10-19 DIAGNOSIS — R911 Solitary pulmonary nodule: Secondary | ICD-10-CM

## 2013-10-19 NOTE — Progress Notes (Signed)
Quick Note:  Called and spoke to pt. Informed pt of results and recs per MR. Pt verbalized understanding and denied any further questions or concerns at this time. Order placed for CT in one year and for 1 year recall. Nothing further needed. ______

## 2013-10-19 NOTE — Telephone Encounter (Signed)
She should talk to pcp BADGER,MICHAEL C, MD and reduce the dose perhaps

## 2013-10-19 NOTE — Telephone Encounter (Signed)
Called and spoke to pt. Pt stated she is taking the Valsartan 80mg  every other day instead of daily. Pt stated when she takes it daily her blood pressure gets too low. Pt stated her primary physician is the one who initially prescribed the medication, pt saw her PCP in early August and did not tell them how she is taking her medication. Will forward to MR.

## 2013-10-19 NOTE — Telephone Encounter (Signed)
Called and spoke to pt. Informed pt of recs per MR. Pt verbalized understanding and denied any further questions or concerns at this time.  

## 2014-02-14 ENCOUNTER — Other Ambulatory Visit: Payer: Self-pay | Admitting: Internal Medicine

## 2014-08-19 ENCOUNTER — Telehealth: Payer: Self-pay | Admitting: Internal Medicine

## 2014-08-19 NOTE — Telephone Encounter (Signed)
I called pt with her appt for the August CT that was ordered by Dr. Chase Caller.  Pt stated she does not wish to do CT's anymore.  I asked if she was declining having this done and pt stated yes. I called [facility], spoke with Stacy, and cancelled the appt that was scheduled 09/27/14.

## 2014-08-28 NOTE — Telephone Encounter (Signed)
Please call patient and   1) say we respect desire to refuse CT scan but 2) would like to know why? Cost? Feels well? Does not want to followup ? Not satisfied with care?  3) if she would like fu appt for cough or if feeling ok wants to keep it prn?  Thanks  Dr. Brand Males, M.D., [facility].C.P Pulmonary and Critical Care Medicine Staff Physician [place] [facility] Pager: [phone], If no answer or between  15:00h - 7:00h: call [phone]  08/28/2014 10:41 AM

## 2014-08-29 NOTE — Telephone Encounter (Signed)
lmtcb x1 

## 2014-08-30 NOTE — Telephone Encounter (Signed)
Called spoke with pt. She reports the reason she refuses CT is bc the last time she had one done it was no change. She is feeling better and has only occas cough.

## 2014-09-27 ENCOUNTER — Inpatient Hospital Stay: Admission: RE | Admit: 2014-09-27 | Payer: Self-pay | Source: Ambulatory Visit

## 2015-04-19 DIAGNOSIS — K219 Gastro-esophageal reflux disease without esophagitis: Secondary | ICD-10-CM | POA: Diagnosis not present

## 2015-04-19 DIAGNOSIS — R918 Other nonspecific abnormal finding of lung field: Secondary | ICD-10-CM | POA: Diagnosis not present

## 2015-04-19 DIAGNOSIS — E782 Mixed hyperlipidemia: Secondary | ICD-10-CM | POA: Diagnosis not present

## 2015-04-19 DIAGNOSIS — M109 Gout, unspecified: Secondary | ICD-10-CM | POA: Diagnosis not present

## 2015-04-19 DIAGNOSIS — R634 Abnormal weight loss: Secondary | ICD-10-CM | POA: Diagnosis not present

## 2015-04-19 DIAGNOSIS — I10 Essential (primary) hypertension: Secondary | ICD-10-CM | POA: Diagnosis not present

## 2015-04-19 DIAGNOSIS — R05 Cough: Secondary | ICD-10-CM | POA: Diagnosis not present

## 2015-04-19 DIAGNOSIS — E119 Type 2 diabetes mellitus without complications: Secondary | ICD-10-CM | POA: Diagnosis not present

## 2015-04-19 DIAGNOSIS — L03011 Cellulitis of right finger: Secondary | ICD-10-CM | POA: Diagnosis not present

## 2015-04-19 DIAGNOSIS — I2510 Atherosclerotic heart disease of native coronary artery without angina pectoris: Secondary | ICD-10-CM | POA: Diagnosis not present

## 2015-04-19 DIAGNOSIS — M150 Primary generalized (osteo)arthritis: Secondary | ICD-10-CM | POA: Diagnosis not present

## 2015-06-14 DIAGNOSIS — E876 Hypokalemia: Secondary | ICD-10-CM | POA: Diagnosis not present

## 2015-06-14 DIAGNOSIS — E1165 Type 2 diabetes mellitus with hyperglycemia: Secondary | ICD-10-CM | POA: Diagnosis not present

## 2015-06-14 DIAGNOSIS — E119 Type 2 diabetes mellitus without complications: Secondary | ICD-10-CM | POA: Diagnosis not present

## 2015-06-14 DIAGNOSIS — E1169 Type 2 diabetes mellitus with other specified complication: Secondary | ICD-10-CM | POA: Diagnosis not present

## 2015-09-14 DIAGNOSIS — B351 Tinea unguium: Secondary | ICD-10-CM | POA: Diagnosis not present

## 2015-09-14 DIAGNOSIS — I10 Essential (primary) hypertension: Secondary | ICD-10-CM | POA: Diagnosis not present

## 2015-09-14 DIAGNOSIS — E1169 Type 2 diabetes mellitus with other specified complication: Secondary | ICD-10-CM | POA: Diagnosis not present

## 2015-09-14 DIAGNOSIS — C801 Malignant (primary) neoplasm, unspecified: Secondary | ICD-10-CM | POA: Diagnosis not present

## 2015-09-14 DIAGNOSIS — G63 Polyneuropathy in diseases classified elsewhere: Secondary | ICD-10-CM | POA: Diagnosis not present

## 2015-09-14 DIAGNOSIS — E1165 Type 2 diabetes mellitus with hyperglycemia: Secondary | ICD-10-CM | POA: Diagnosis not present

## 2015-09-14 DIAGNOSIS — E782 Mixed hyperlipidemia: Secondary | ICD-10-CM | POA: Diagnosis not present

## 2015-09-14 DIAGNOSIS — E669 Obesity, unspecified: Secondary | ICD-10-CM | POA: Diagnosis not present

## 2015-11-20 DIAGNOSIS — E119 Type 2 diabetes mellitus without complications: Secondary | ICD-10-CM | POA: Diagnosis not present

## 2015-11-20 DIAGNOSIS — H2513 Age-related nuclear cataract, bilateral: Secondary | ICD-10-CM | POA: Diagnosis not present

## 2015-11-20 DIAGNOSIS — H35371 Puckering of macula, right eye: Secondary | ICD-10-CM | POA: Diagnosis not present

## 2015-11-20 DIAGNOSIS — H25013 Cortical age-related cataract, bilateral: Secondary | ICD-10-CM | POA: Diagnosis not present

## 2015-11-20 DIAGNOSIS — H43813 Vitreous degeneration, bilateral: Secondary | ICD-10-CM | POA: Diagnosis not present

## 2015-11-20 DIAGNOSIS — H43393 Other vitreous opacities, bilateral: Secondary | ICD-10-CM | POA: Diagnosis not present

## 2015-12-20 DIAGNOSIS — I10 Essential (primary) hypertension: Secondary | ICD-10-CM | POA: Diagnosis not present

## 2015-12-20 DIAGNOSIS — Z0000 Encounter for general adult medical examination without abnormal findings: Secondary | ICD-10-CM | POA: Diagnosis not present

## 2015-12-20 DIAGNOSIS — E782 Mixed hyperlipidemia: Secondary | ICD-10-CM | POA: Diagnosis not present

## 2015-12-20 DIAGNOSIS — G63 Polyneuropathy in diseases classified elsewhere: Secondary | ICD-10-CM | POA: Diagnosis not present

## 2015-12-20 DIAGNOSIS — E669 Obesity, unspecified: Secondary | ICD-10-CM | POA: Diagnosis not present

## 2015-12-20 DIAGNOSIS — E119 Type 2 diabetes mellitus without complications: Secondary | ICD-10-CM | POA: Diagnosis not present

## 2015-12-22 DIAGNOSIS — H35371 Puckering of macula, right eye: Secondary | ICD-10-CM | POA: Diagnosis not present

## 2016-01-31 DIAGNOSIS — G629 Polyneuropathy, unspecified: Secondary | ICD-10-CM | POA: Diagnosis not present

## 2016-03-05 DIAGNOSIS — E119 Type 2 diabetes mellitus without complications: Secondary | ICD-10-CM | POA: Diagnosis not present

## 2016-03-05 DIAGNOSIS — H2513 Age-related nuclear cataract, bilateral: Secondary | ICD-10-CM | POA: Diagnosis not present

## 2016-03-05 DIAGNOSIS — I10 Essential (primary) hypertension: Secondary | ICD-10-CM | POA: Diagnosis not present

## 2016-03-05 DIAGNOSIS — H18413 Arcus senilis, bilateral: Secondary | ICD-10-CM | POA: Diagnosis not present

## 2016-03-05 DIAGNOSIS — H2512 Age-related nuclear cataract, left eye: Secondary | ICD-10-CM | POA: Diagnosis not present

## 2016-04-15 DIAGNOSIS — H2512 Age-related nuclear cataract, left eye: Secondary | ICD-10-CM | POA: Diagnosis not present

## 2016-04-16 DIAGNOSIS — H25013 Cortical age-related cataract, bilateral: Secondary | ICD-10-CM | POA: Diagnosis not present

## 2016-04-16 DIAGNOSIS — H2513 Age-related nuclear cataract, bilateral: Secondary | ICD-10-CM | POA: Diagnosis not present

## 2016-04-16 DIAGNOSIS — H2511 Age-related nuclear cataract, right eye: Secondary | ICD-10-CM | POA: Diagnosis not present

## 2016-05-13 DIAGNOSIS — H2511 Age-related nuclear cataract, right eye: Secondary | ICD-10-CM | POA: Diagnosis not present

## 2016-05-13 DIAGNOSIS — H2513 Age-related nuclear cataract, bilateral: Secondary | ICD-10-CM | POA: Diagnosis not present

## 2016-06-12 ENCOUNTER — Other Ambulatory Visit: Payer: Self-pay | Admitting: Pharmacy Technician

## 2016-06-12 NOTE — Patient Outreach (Signed)
[facility]) Care Management  06/12/2016  Melissa Dalton [facility] Feb 24, 1934 [id number]  Contacted patient in reference to medication adherence for [employer]. Patient states she takes Metformin at night and Losartan in the morning as prescribed. Patient states she may sometimes take 1 tablet of Glipizide depending on her glucose readings even though it's prescribed to take twice daily.  I will inform physician just to make sure she is aware. Patient is starting to get 3 month supplies on some of her medication's but is interested in having all of her medication's converted to 3 month's. I'm going to contact Dr. Ouida Sills to request this on her behalf and have them sent to [facility] on [address] if approved. I will continue to follow patient over the next few months.  Doreene Burke, [place] [phone]

## 2016-08-06 DIAGNOSIS — Z789 Other specified health status: Secondary | ICD-10-CM | POA: Diagnosis not present

## 2016-08-06 DIAGNOSIS — I10 Essential (primary) hypertension: Secondary | ICD-10-CM | POA: Diagnosis not present

## 2016-08-06 DIAGNOSIS — E119 Type 2 diabetes mellitus without complications: Secondary | ICD-10-CM | POA: Diagnosis not present

## 2016-08-06 DIAGNOSIS — G63 Polyneuropathy in diseases classified elsewhere: Secondary | ICD-10-CM | POA: Diagnosis not present

## 2016-08-06 DIAGNOSIS — I739 Peripheral vascular disease, unspecified: Secondary | ICD-10-CM | POA: Diagnosis not present

## 2016-08-06 DIAGNOSIS — Z9181 History of falling: Secondary | ICD-10-CM | POA: Diagnosis not present

## 2016-08-06 DIAGNOSIS — E782 Mixed hyperlipidemia: Secondary | ICD-10-CM | POA: Diagnosis not present

## 2016-08-22 DIAGNOSIS — H40053 Ocular hypertension, bilateral: Secondary | ICD-10-CM | POA: Diagnosis not present

## 2017-02-07 DIAGNOSIS — R918 Other nonspecific abnormal finding of lung field: Secondary | ICD-10-CM | POA: Diagnosis not present

## 2017-02-07 DIAGNOSIS — E1165 Type 2 diabetes mellitus with hyperglycemia: Secondary | ICD-10-CM | POA: Diagnosis not present

## 2017-02-07 DIAGNOSIS — E782 Mixed hyperlipidemia: Secondary | ICD-10-CM | POA: Diagnosis not present

## 2017-02-07 DIAGNOSIS — M150 Primary generalized (osteo)arthritis: Secondary | ICD-10-CM | POA: Diagnosis not present

## 2017-02-07 DIAGNOSIS — I10 Essential (primary) hypertension: Secondary | ICD-10-CM | POA: Diagnosis not present

## 2017-02-07 DIAGNOSIS — D649 Anemia, unspecified: Secondary | ICD-10-CM | POA: Diagnosis not present

## 2017-02-07 DIAGNOSIS — E118 Type 2 diabetes mellitus with unspecified complications: Secondary | ICD-10-CM | POA: Diagnosis not present

## 2017-02-07 DIAGNOSIS — Z0000 Encounter for general adult medical examination without abnormal findings: Secondary | ICD-10-CM | POA: Diagnosis not present

## 2017-02-07 DIAGNOSIS — Z6841 Body Mass Index (BMI) 40.0 and over, adult: Secondary | ICD-10-CM | POA: Diagnosis not present

## 2017-04-22 ENCOUNTER — Emergency Department (HOSPITAL_COMMUNITY): Payer: PPO

## 2017-04-22 ENCOUNTER — Emergency Department (HOSPITAL_COMMUNITY)
Admission: EM | Admit: 2017-04-22 | Discharge: 2017-04-22 | Disposition: A | Discharge status: Home / Self Care | Payer: PPO | Source: Home / Self Care | Attending: Emergency Medicine | Admitting: Emergency Medicine

## 2017-04-22 ENCOUNTER — Encounter (HOSPITAL_COMMUNITY): Payer: Self-pay | Admitting: *Deleted

## 2017-04-22 ENCOUNTER — Other Ambulatory Visit: Payer: Self-pay

## 2017-04-22 DIAGNOSIS — R4182 Altered mental status, unspecified: Secondary | ICD-10-CM | POA: Diagnosis not present

## 2017-04-22 DIAGNOSIS — Z7982 Long term (current) use of aspirin: Secondary | ICD-10-CM | POA: Insufficient documentation

## 2017-04-22 DIAGNOSIS — E669 Obesity, unspecified: Secondary | ICD-10-CM | POA: Diagnosis not present

## 2017-04-22 DIAGNOSIS — I10 Essential (primary) hypertension: Secondary | ICD-10-CM | POA: Insufficient documentation

## 2017-04-22 DIAGNOSIS — F17290 Nicotine dependence, other tobacco product, uncomplicated: Secondary | ICD-10-CM | POA: Insufficient documentation

## 2017-04-22 DIAGNOSIS — R05 Cough: Secondary | ICD-10-CM | POA: Diagnosis not present

## 2017-04-22 DIAGNOSIS — R5381 Other malaise: Secondary | ICD-10-CM

## 2017-04-22 DIAGNOSIS — E119 Type 2 diabetes mellitus without complications: Secondary | ICD-10-CM

## 2017-04-22 DIAGNOSIS — R404 Transient alteration of awareness: Secondary | ICD-10-CM | POA: Diagnosis not present

## 2017-04-22 DIAGNOSIS — N390 Urinary tract infection, site not specified: Secondary | ICD-10-CM | POA: Diagnosis not present

## 2017-04-22 DIAGNOSIS — G9340 Encephalopathy, unspecified: Secondary | ICD-10-CM | POA: Diagnosis not present

## 2017-04-22 DIAGNOSIS — Z9181 History of falling: Secondary | ICD-10-CM | POA: Diagnosis not present

## 2017-04-22 DIAGNOSIS — Z79899 Other long term (current) drug therapy: Secondary | ICD-10-CM | POA: Insufficient documentation

## 2017-04-22 DIAGNOSIS — Z833 Family history of diabetes mellitus: Secondary | ICD-10-CM | POA: Diagnosis not present

## 2017-04-22 DIAGNOSIS — J1000 Influenza due to other identified influenza virus with unspecified type of pneumonia: Secondary | ICD-10-CM | POA: Diagnosis not present

## 2017-04-22 DIAGNOSIS — J069 Acute upper respiratory infection, unspecified: Secondary | ICD-10-CM

## 2017-04-22 DIAGNOSIS — M25552 Pain in left hip: Secondary | ICD-10-CM | POA: Diagnosis not present

## 2017-04-22 DIAGNOSIS — E1169 Type 2 diabetes mellitus with other specified complication: Secondary | ICD-10-CM | POA: Diagnosis not present

## 2017-04-22 DIAGNOSIS — Z7984 Long term (current) use of oral hypoglycemic drugs: Secondary | ICD-10-CM | POA: Diagnosis not present

## 2017-04-22 DIAGNOSIS — B9789 Other viral agents as the cause of diseases classified elsewhere: Secondary | ICD-10-CM

## 2017-04-22 DIAGNOSIS — Z8249 Family history of ischemic heart disease and other diseases of the circulatory system: Secondary | ICD-10-CM | POA: Diagnosis not present

## 2017-04-22 DIAGNOSIS — G9341 Metabolic encephalopathy: Secondary | ICD-10-CM | POA: Diagnosis not present

## 2017-04-22 DIAGNOSIS — B999 Unspecified infectious disease: Secondary | ICD-10-CM | POA: Diagnosis not present

## 2017-04-22 DIAGNOSIS — B9620 Unspecified Escherichia coli [E. coli] as the cause of diseases classified elsewhere: Secondary | ICD-10-CM | POA: Diagnosis not present

## 2017-04-22 DIAGNOSIS — M109 Gout, unspecified: Secondary | ICD-10-CM | POA: Diagnosis not present

## 2017-04-22 DIAGNOSIS — R000 Tachycardia, unspecified: Secondary | ICD-10-CM | POA: Diagnosis not present

## 2017-04-22 DIAGNOSIS — S3993XA Unspecified injury of pelvis, initial encounter: Secondary | ICD-10-CM | POA: Diagnosis not present

## 2017-04-22 DIAGNOSIS — E785 Hyperlipidemia, unspecified: Secondary | ICD-10-CM | POA: Diagnosis not present

## 2017-04-22 DIAGNOSIS — E876 Hypokalemia: Secondary | ICD-10-CM | POA: Diagnosis not present

## 2017-04-22 DIAGNOSIS — M6281 Muscle weakness (generalized): Secondary | ICD-10-CM | POA: Diagnosis not present

## 2017-04-22 DIAGNOSIS — J111 Influenza due to unidentified influenza virus with other respiratory manifestations: Secondary | ICD-10-CM | POA: Diagnosis not present

## 2017-04-22 DIAGNOSIS — R531 Weakness: Secondary | ICD-10-CM | POA: Diagnosis not present

## 2017-04-22 DIAGNOSIS — J189 Pneumonia, unspecified organism: Secondary | ICD-10-CM | POA: Diagnosis not present

## 2017-04-22 DIAGNOSIS — R278 Other lack of coordination: Secondary | ICD-10-CM | POA: Diagnosis not present

## 2017-04-22 DIAGNOSIS — Z7902 Long term (current) use of antithrombotics/antiplatelets: Secondary | ICD-10-CM

## 2017-04-22 DIAGNOSIS — R0602 Shortness of breath: Secondary | ICD-10-CM | POA: Diagnosis not present

## 2017-04-22 DIAGNOSIS — J80 Acute respiratory distress syndrome: Secondary | ICD-10-CM | POA: Diagnosis not present

## 2017-04-22 DIAGNOSIS — E1140 Type 2 diabetes mellitus with diabetic neuropathy, unspecified: Secondary | ICD-10-CM | POA: Diagnosis not present

## 2017-04-22 DIAGNOSIS — R51 Headache: Secondary | ICD-10-CM | POA: Diagnosis not present

## 2017-04-22 LAB — COMPREHENSIVE METABOLIC PANEL
ALT: 16 U/L (ref 14–54)
ANION GAP: 10 (ref 5–15)
AST: 35 U/L (ref 15–41)
Albumin: 3 g/dL — ABNORMAL LOW (ref 3.5–5.0)
Alkaline Phosphatase: 45 U/L (ref 38–126)
BILIRUBIN TOTAL: 0.5 mg/dL (ref 0.3–1.2)
BUN: 19 mg/dL (ref 6–20)
CHLORIDE: 105 mmol/L (ref 101–111)
CO2: 25 mmol/L (ref 22–32)
Calcium: 8.5 mg/dL — ABNORMAL LOW (ref 8.9–10.3)
Creatinine, Ser: 0.75 mg/dL (ref 0.44–1.00)
GFR calc Af Amer: >60 mL/min (ref >60)
Glucose, Bld: 95 mg/dL (ref 65–99)
POTASSIUM: 3.2 mmol/L — AB (ref 3.5–5.1)
Sodium: 140 mmol/L (ref 135–145)
TOTAL PROTEIN: 6.1 g/dL — AB (ref 6.5–8.1)

## 2017-04-22 LAB — CBC WITH DIFFERENTIAL/PLATELET
BASOS PCT: 0 %
Basophils Absolute: 0 10*3/uL (ref 0.0–0.1)
EOS ABS: 0 10*3/uL (ref 0.0–0.7)
Eosinophils Relative: 0 %
HEMATOCRIT: 37.5 % (ref 36.0–46.0)
Hemoglobin: 12.5 g/dL (ref 12.0–15.0)
LYMPHS ABS: 0.9 10*3/uL (ref 0.7–4.0)
Lymphocytes Relative: 16 %
MCH: 33.9 pg (ref 26.0–34.0)
MCHC: 33.3 g/dL (ref 30.0–36.0)
MCV: 101.6 fL — ABNORMAL HIGH (ref 78.0–100.0)
MONO ABS: 0.5 10*3/uL (ref 0.1–1.0)
MONOS PCT: 9 %
Neutro Abs: 4.1 10*3/uL (ref 1.7–7.7)
Neutrophils Relative %: 75 %
Platelets: 149 10*3/uL — ABNORMAL LOW (ref 150–400)
RBC: 3.69 MIL/uL — ABNORMAL LOW (ref 3.87–5.11)
RDW: 14.9 % (ref 11.5–15.5)
WBC: 5.4 10*3/uL (ref 4.0–10.5)

## 2017-04-22 LAB — URINALYSIS, ROUTINE W REFLEX MICROSCOPIC
BILIRUBIN URINE: NEGATIVE
GLUCOSE, UA: NEGATIVE mg/dL
KETONES UR: NEGATIVE mg/dL
NITRITE: NEGATIVE
PH: 5 (ref 5.0–8.0)
PROTEIN: NEGATIVE mg/dL
Specific Gravity, Urine: 1.012 (ref 1.005–1.030)

## 2017-04-22 LAB — I-STAT CHEM 8, ED
BUN: 21 mg/dL — ABNORMAL HIGH (ref 6–20)
CALCIUM ION: 1.17 mmol/L (ref 1.15–1.40)
CREATININE: 0.7 mg/dL (ref 0.44–1.00)
Chloride: 102 mmol/L (ref 101–111)
Glucose, Bld: 89 mg/dL (ref 65–99)
HEMATOCRIT: 37 % (ref 36.0–46.0)
HEMOGLOBIN: 12.6 g/dL (ref 12.0–15.0)
Potassium: 3.2 mmol/L — ABNORMAL LOW (ref 3.5–5.1)
SODIUM: 142 mmol/L (ref 135–145)
TCO2: 26 mmol/L (ref 22–32)

## 2017-04-22 MED ORDER — BENZONATATE 100 MG PO CAPS: 100.0000 mg | ORAL_CAPSULE | Freq: Three times a day (TID) | ORAL | 0 refills | Status: DC

## 2017-04-22 MED ORDER — BENZONATATE 100 MG PO CAPS
100.0000 mg | ORAL_CAPSULE | Freq: Once | ORAL | Status: AC
  Administered 2017-04-22: 100 mg via ORAL
  Filled 2017-04-22: qty 1

## 2017-04-22 MED ORDER — SODIUM CHLORIDE 0.9 % IV BOLUS (SEPSIS)
1000.0000 mL | Freq: Once | INTRAVENOUS | Status: AC
Start: 2017-04-22 — End: 2017-04-22
  Administered 2017-04-22: 1000 mL via INTRAVENOUS

## 2017-04-22 NOTE — Progress Notes (Signed)
CSW acknowledges consult for [facility] needs. CSW has spoken with [facility] about HH needs. RNCM working on setting up [facility] needs for pt at this time. CSW signing off as there are no further CSW interventions needed.    Melissa Dalton, MSW, [facility] Clinical Social Worker [phone]

## 2017-04-22 NOTE — ED Notes (Signed)
Returned from xray

## 2017-04-22 NOTE — ED Provider Notes (Signed)
[facility] EMERGENCY DEPARTMENT Provider Note   CSN: [id number] Arrival date & time: 04/22/17  6301     History   Chief Complaint Chief Complaint  Patient presents with  . Atrial Fibrillation  . Fever  . Fall    HPI Melissa Dalton is a 83 y.o. female.  83 yo F with a chief complaint of cough congestion and subjective fevers and chills.  This been going on for the past week.  Patient has a sick contact in her husband.  She became weak and fell couple days ago.  Landed on her right arm.  Has some very mild forearm pain.  Denies head injury neck pain chest pain abdominal pain or back pain.  She denies vomiting or diarrhea.  Has been able to eat and drink.  She has some chronic pains all over but nothing new over the past week.  No new medications.   The history is provided by the patient.  Fever   Associated symptoms include congestion and cough. Pertinent negatives include no chest pain, no vomiting and no headaches.  Fall  Pertinent negatives include no chest pain, no abdominal pain, no headaches and no shortness of breath.  Illness  This is a new problem. The current episode started yesterday. The problem occurs constantly. The problem has not changed since onset.Pertinent negatives include no chest pain, no abdominal pain, no headaches and no shortness of breath. Nothing aggravates the symptoms. Nothing relieves the symptoms. She has tried nothing for the symptoms. The treatment provided no relief.    Past Medical History:  Diagnosis Date  . Arthritis   . Diabetes ([place])   . High cholesterol   . HTN (hypertension)     Patient Active Problem List   Diagnosis Date Noted  . Coronary artery calcification 12/20/2012  . Multiple lung nodules 10/25/2012  . Chronic cough 10/25/2012    Past Surgical History:  Procedure Laterality Date  . HERNIA REPAIR    . TOTAL ABDOMINAL HYSTERECTOMY    . TUMOR REMOVAL      OB History    No data available        Home Medications    Prior to Admission medications   Medication Sig Start Date End Date Taking? Authorizing Provider  allopurinol (ZYLOPRIM) 300 MG tablet TAKE ONE TABLET BY MOUTH ONCE DAILY 09/09/12   [provider]  aspirin 325 MG tablet 325 mg. Take 325 mg by mouth 2 (two) times daily.    [provider]  benzonatate (TESSALON) 100 MG capsule Take 1 capsule (100 mg total) by mouth every 8 (eight) hours. 04/22/17   Deno Etienne, DO  Cholecalciferol (VITAMIN D) 1000 UNITS capsule 1,000 mg. Take 1,000 mg by mouth.    [provider]  fenofibrate 160 MG tablet TAKE ONE TABLET BY MOUTH EVERY DAY 10/22/12   [provider]  fexofenadine (ALLEGRA) 180 MG tablet 180 mg. Take 180 mg by mouth daily.    [provider]  Flunisolide HFA (AEROSPAN) 80 MCG/ACT AERS Inhale 2 puffs into the lungs 2 (two) times daily.    [provider]  fluticasone (FLONASE) 50 MCG/ACT nasal spray Place 2 sprays into both nostrils daily.    [provider]  glipiZIDE (GLUCOTROL) 10 MG tablet TAKE ONE TABLET BY MOUTH TWICE DAILY BEFORE A MEAL 06/30/12   [provider]  niacin (NIASPAN) 500 MG CR tablet Take 3 po nightly 01/31/11   [provider]  pantoprazole (PROTONIX) 40 MG  tablet TAKE ONE TABLET BY MOUTH ONCE DAILY 10/20/13   Brand Males, MD  potassium chloride SA (KLOR-CON M20) 20 MEQ tablet TAKE ONE TABLET BY MOUTH EVERY DAY. 08/06/12   [provider]  pravastatin (PRAVACHOL) 40 MG tablet TAKE ONE TABLET BY MOUTH EVERY DAY AS DIRECTED 08/18/12   [provider]  ranitidine (ZANTAC) 300 MG capsule Take 1 capsule (300 mg total) by mouth every evening. 02/08/13   Brand Males, MD  sodium chloride (OCEAN) 0.65 % SOLN nasal spray Place 1 spray into both nostrils as needed for congestion.    [provider]  torsemide (DEMADEX) 20 MG tablet TAKE ONE TO TWO TABLETS BY MOUTH EVERY DAY 09/02/12   [provider]  triamcinolone (NASACORT ALLERGY 24HR) 55 MCG/ACT AERO nasal inhaler Place 2 sprays into the nose daily.    [provider]  valsartan (DIOVAN) 80 MG tablet Take 1 tablet (80 mg total) by mouth daily. 02/08/13   Brand Males, MD    Family History Family History  Problem Relation Age of Onset  . Heart disease Father   . Diabetes Mother     Social History Social History   Tobacco Use  . Smoking status: Never Smoker  . Smokeless tobacco: Current User  Substance Use Topics  . Alcohol use: No  . Drug use: No     Allergies   Penicillins and Tramadol   Review of Systems Review of Systems  Constitutional: Positive for chills and fever (subjective).  HENT: Positive for congestion. Negative for rhinorrhea.   Eyes: Negative for redness and visual disturbance.  Respiratory: Positive for cough. Negative for shortness of breath and wheezing.   Cardiovascular: Negative for chest pain and palpitations.  Gastrointestinal: Negative for abdominal pain, nausea and vomiting.  Genitourinary: Negative for dysuria and urgency.  Musculoskeletal: Negative for arthralgias and myalgias.  Skin: Negative for pallor and wound.  Neurological: Negative for dizziness and headaches.     Physical Exam Updated Vital Signs BP 131/83   Pulse 97   Temp 98.9 F (37.2 C) (Rectal)   Resp (!) 21   Ht 5\' 2"  (1.575 m)   Wt 99.8 kg (220 lb)   SpO2 97%   BMI 40.24 kg/m   Physical Exam  Constitutional: She is oriented to person, place, and time. She appears well-developed and well-nourished. No distress.  HENT:  Head: Normocephalic and atraumatic.  Swollen turbinates, posterior nasal drip, no noted sinus ttp,     Eyes: EOM are normal. Pupils are equal, round, and reactive to light.  Neck: Normal range of motion. Neck supple.  Cardiovascular: Normal rate and regular rhythm. Exam reveals no gallop and no friction rub.  No murmur heard. Pulmonary/Chest: Effort normal. She has  no wheezes. She has no rales.  Rhonchi RLL  Abdominal: Soft. She exhibits no distension. There is no tenderness.  Musculoskeletal: She exhibits no edema or tenderness.  Neurological: She is alert and oriented to person, place, and time.  Skin: Skin is warm and dry. She is not diaphoretic.  Psychiatric: She has a normal mood and affect. Her behavior is normal.  Nursing note and vitals reviewed.    ED Treatments / Results  Labs (all labs ordered are listed, but only abnormal results are displayed) Labs Reviewed  CBC WITH DIFFERENTIAL/PLATELET - Abnormal; Notable for the following components:      Result Value   RBC 3.69 (*)    MCV 101.6 (*)    Platelets 149 (*)  All other components within normal limits  COMPREHENSIVE METABOLIC PANEL - Abnormal; Notable for the following components:   Potassium 3.2 (*)    Calcium 8.5 (*)    Total Protein 6.1 (*)    Albumin 3.0 (*)    All other components within normal limits  URINALYSIS, ROUTINE W REFLEX MICROSCOPIC - Abnormal; Notable for the following components:   APPearance HAZY (*)    Hgb urine dipstick SMALL (*)    Leukocytes, UA LARGE (*)    Bacteria, UA RARE (*)    Squamous Epithelial / LPF 0-5 (*)    All other components within normal limits  I-STAT CHEM 8, ED - Abnormal; Notable for the following components:   Potassium 3.2 (*)    BUN 21 (*)    All other components within normal limits    EKG  EKG Interpretation  Date/Time:  Tuesday April 22 2017 10:39:29 EDT Ventricular Rate:  103 PR Interval:    QRS Duration: 153 QT Interval:  391 QTC Calculation: 512 R Axis:   -75 Text Interpretation:  Sinus tachycardia Atrial premature complexes RBBB and LAFB Baseline wander in lead(s) V2 Otherwise no significant change Confirmed by Deno Etienne [id number]) on 04/22/2017 10:47:55 AM       Radiology Dg Chest 2 View  Result Date: 04/22/2017 CLINICAL DATA:  Productive cough over the last 2 weeks.  Fever. EXAM: CHEST - 2 VIEW COMPARISON:   No previous chest radiography. Previous chest CT 10/07/2013 FINDINGS: Heart size is normal. The aorta shows unfolding and calcification. The patient has multiple bilateral pulmonary nodules under a cm in size, more numerous on the right than the left, unchanged when compared to previous CT studies. No sign of infiltrate, effusion or active collapse. No significant bone finding. IMPRESSION: No active disease by radiography. Multiple pulmonary nodules as seen previously, presumed benign at this point. Electronically Signed   By: Nelson Chimes M.D.   On: 04/22/2017 10:24    Procedures Procedures (including critical care time)  Medications Ordered in ED Medications  sodium chloride 0.9 % bolus 1,000 mL (1,000 mLs Intravenous New Bag/Given 04/22/17 1042)  benzonatate (TESSALON) capsule 100 mg (100 mg Oral Given 04/22/17 1223)     Initial Impression / Assessment and Plan / ED Course  I have reviewed the triage vital signs and the nursing notes.  Pertinent labs & imaging results that were available during my care of the patient were reviewed by me and considered in my medical decision making (see chart for details).     83 yo F with a chief complaint of cough and congestion.  Going on for the past week.  The patient's family called 63.  The patient came here against her will.  EKG in route was concerning for atrial fibrillation by EMS.  Reviewed by myself and looks to be more artifact than A. fib.  Heart rate and rhythm is regular on my exam. Will give iv fluids, cxr, ua labs, ambulate.  The patient is unable to stand on her own and is barely able to take steps with significant assistance.  She feels mildly better after IV fluids just feels completely exhausted.  I discussed with her the benefits of inpatient versus outpatient therapy.  I felt that it was dangerous for her to be discharged home with a likelihood of her falling should her husband try and assist her to the bathroom or to a chair.  The  patient however refuses to come into the hospital.  Would like to  try to continue to stay at home.  I will have social work evaluate for home health.  Orders were placed.  1:19 PM:  I have discussed the diagnosis/risks/treatment options with the patient and family and believe the pt to be eligible for discharge home to follow-up with PCP. We also discussed returning to the ED immediately if new or worsening sx occur. We discussed the sx which are most concerning (e.g., sudden worsening pain, fever, inability to tolerate by mouth) that necessitate immediate return. Medications administered to the patient during their visit and any new prescriptions provided to the patient are listed below.  Medications given during this visit Medications  sodium chloride 0.9 % bolus 1,000 mL (1,000 mLs Intravenous New Bag/Given 04/22/17 1042)  benzonatate (TESSALON) capsule 100 mg (100 mg Oral Given 04/22/17 1223)     The patient appears reasonably screen and/or stabilized for discharge and I doubt any other medical condition or other [facility] requiring further screening, evaluation, or treatment in the ED at this time prior to discharge.    Final Clinical Impressions(s) / ED Diagnoses   Final diagnoses:  Physical deconditioning  Viral URI with cough    ED Discharge Orders        [place]     04/22/17 1316    Face-to-face encounter (required for Medicare/Medicaid patients)    Comments:  I Cecilio Asper certify that this patient is under my care and that I, or a nurse practitioner or physician's assistant working with me, had a face-to-face encounter that meets the physician face-to-face encounter requirements with this patient on 04/22/2017. The encounter with the patient was in whole, or in part for the following medical condition(s) which is the primary reason for home health care (List medical condition): Deconditioning, secondary to viral illness.  Patient is unable to stand on her own  easily able to transfer to a chair.   04/22/17 1316    benzonatate (TESSALON) 100 MG capsule  Every 8 hours     04/22/17 [place], [place], DO 04/22/17 1319

## 2017-04-22 NOTE — ED Notes (Signed)
Pt required 3 assist to get back into bed after using BSC -- pt lives with husband--

## 2017-04-22 NOTE — Discharge Planning (Signed)
Porschea Borys J. Clydene Laming, RN, BSN, [employer] [phone] Spoke with pt at bedside regarding discharge planning for [facility]. Offered pt list of home health agencies to choose from.  Pt chose [facility] to render services. Janae Sauce, RN of [facility] notified. Patient made aware that [facility] will be in contact in 24-48 hours.  No DME needs identified at this time.   Pt physical address [place], [place], [place]

## 2017-04-22 NOTE — ED Triage Notes (Signed)
Info from EMS.  PT fell at home on Monday .Daughert at PT home today and called EMS because  Pt was different. Daughter found pill bottles all over room ,Pt was using a ink pen to try to prick self for CBG check.  PT husband Dx with flu and PT had a fever 102 per EMS . A possible new on set A-fib with a HR of 169 -78.  Pt received over 500 mls NS IV

## 2017-04-22 NOTE — ED Notes (Signed)
[facility] called to transport home

## 2017-04-23 ENCOUNTER — Inpatient Hospital Stay (HOSPITAL_COMMUNITY)
Admission: EM | Admit: 2017-04-23 | Discharge: 2017-04-29 | DRG: 193 | Disposition: A | Discharge status: Skilled Nursing Facility | Payer: PPO | Attending: Internal Medicine | Admitting: Internal Medicine

## 2017-04-23 ENCOUNTER — Encounter (HOSPITAL_COMMUNITY): Payer: Self-pay | Admitting: Emergency Medicine

## 2017-04-23 ENCOUNTER — Emergency Department (HOSPITAL_COMMUNITY): Payer: PPO

## 2017-04-23 DIAGNOSIS — E1169 Type 2 diabetes mellitus with other specified complication: Secondary | ICD-10-CM

## 2017-04-23 DIAGNOSIS — E119 Type 2 diabetes mellitus without complications: Secondary | ICD-10-CM | POA: Diagnosis present

## 2017-04-23 DIAGNOSIS — Z7984 Long term (current) use of oral hypoglycemic drugs: Secondary | ICD-10-CM

## 2017-04-23 DIAGNOSIS — F17290 Nicotine dependence, other tobacco product, uncomplicated: Secondary | ICD-10-CM | POA: Diagnosis present

## 2017-04-23 DIAGNOSIS — R531 Weakness: Secondary | ICD-10-CM | POA: Diagnosis present

## 2017-04-23 DIAGNOSIS — Z833 Family history of diabetes mellitus: Secondary | ICD-10-CM

## 2017-04-23 DIAGNOSIS — J189 Pneumonia, unspecified organism: Secondary | ICD-10-CM | POA: Diagnosis present

## 2017-04-23 DIAGNOSIS — G9341 Metabolic encephalopathy: Secondary | ICD-10-CM | POA: Diagnosis present

## 2017-04-23 DIAGNOSIS — Z9181 History of falling: Secondary | ICD-10-CM

## 2017-04-23 DIAGNOSIS — E669 Obesity, unspecified: Secondary | ICD-10-CM | POA: Diagnosis present

## 2017-04-23 DIAGNOSIS — Z8249 Family history of ischemic heart disease and other diseases of the circulatory system: Secondary | ICD-10-CM

## 2017-04-23 DIAGNOSIS — J1000 Influenza due to other identified influenza virus with unspecified type of pneumonia: Principal | ICD-10-CM | POA: Diagnosis present

## 2017-04-23 DIAGNOSIS — M109 Gout, unspecified: Secondary | ICD-10-CM | POA: Diagnosis present

## 2017-04-23 DIAGNOSIS — N390 Urinary tract infection, site not specified: Secondary | ICD-10-CM | POA: Diagnosis present

## 2017-04-23 DIAGNOSIS — Z7982 Long term (current) use of aspirin: Secondary | ICD-10-CM

## 2017-04-23 DIAGNOSIS — E876 Hypokalemia: Secondary | ICD-10-CM | POA: Diagnosis present

## 2017-04-23 DIAGNOSIS — I10 Essential (primary) hypertension: Secondary | ICD-10-CM | POA: Diagnosis present

## 2017-04-23 DIAGNOSIS — B9620 Unspecified Escherichia coli [E. coli] as the cause of diseases classified elsewhere: Secondary | ICD-10-CM | POA: Diagnosis present

## 2017-04-23 DIAGNOSIS — E785 Hyperlipidemia, unspecified: Secondary | ICD-10-CM | POA: Diagnosis present

## 2017-04-23 LAB — CBC WITH DIFFERENTIAL/PLATELET
BASOS PCT: 0 %
Basophils Absolute: 0 10*3/uL (ref 0.0–0.1)
EOS ABS: 0 10*3/uL (ref 0.0–0.7)
EOS PCT: 0 %
HCT: 38.4 % (ref 36.0–46.0)
Hemoglobin: 12.7 g/dL (ref 12.0–15.0)
LYMPHS ABS: 1.5 10*3/uL (ref 0.7–4.0)
Lymphocytes Relative: 21 %
MCH: 33.1 pg (ref 26.0–34.0)
MCHC: 33.1 g/dL (ref 30.0–36.0)
MCV: 100 fL (ref 78.0–100.0)
MONOS PCT: 14 %
Monocytes Absolute: 1 10*3/uL (ref 0.1–1.0)
Neutro Abs: 4.5 10*3/uL (ref 1.7–7.7)
Neutrophils Relative %: 65 %
PLATELETS: 154 10*3/uL (ref 150–400)
RBC: 3.84 MIL/uL — ABNORMAL LOW (ref 3.87–5.11)
RDW: 14.5 % (ref 11.5–15.5)
WBC: 7 10*3/uL (ref 4.0–10.5)

## 2017-04-23 LAB — URINALYSIS, ROUTINE W REFLEX MICROSCOPIC
BILIRUBIN URINE: NEGATIVE
Glucose, UA: NEGATIVE mg/dL
KETONES UR: NEGATIVE mg/dL
LEUKOCYTES UA: NEGATIVE
Nitrite: NEGATIVE
Protein, ur: NEGATIVE mg/dL
SPECIFIC GRAVITY, URINE: 1.004 — AB (ref 1.005–1.030)
pH: 5 (ref 5.0–8.0)

## 2017-04-23 LAB — I-STAT CG4 LACTIC ACID, ED: LACTIC ACID, VENOUS: 1.47 mmol/L (ref 0.5–1.9)

## 2017-04-23 LAB — I-STAT CHEM 8, ED
BUN: 15 mg/dL (ref 6–20)
CALCIUM ION: 1.13 mmol/L — AB (ref 1.15–1.40)
CHLORIDE: 99 mmol/L — AB (ref 101–111)
Creatinine, Ser: 0.7 mg/dL (ref 0.44–1.00)
GLUCOSE: 67 mg/dL (ref 65–99)
HCT: 38 % (ref 36.0–46.0)
HEMOGLOBIN: 12.9 g/dL (ref 12.0–15.0)
Potassium: 3 mmol/L — ABNORMAL LOW (ref 3.5–5.1)
Sodium: 140 mmol/L (ref 135–145)
TCO2: 29 mmol/L (ref 22–32)

## 2017-04-23 MED ORDER — SODIUM CHLORIDE 0.9 % IV SOLN
INTRAVENOUS | Status: DC
  Administered 2017-04-23: via INTRAVENOUS

## 2017-04-23 MED ORDER — SODIUM CHLORIDE 0.9 % IV BOLUS (SEPSIS)
500.0000 mL | Freq: Once | INTRAVENOUS | Status: AC
  Administered 2017-04-23: 500 mL via INTRAVENOUS

## 2017-04-23 MED ORDER — SODIUM CHLORIDE 0.9 % IV SOLN
1.0000 g | Freq: Once | INTRAVENOUS | Status: AC
  Administered 2017-04-23: 1 g via INTRAVENOUS
  Filled 2017-04-23: qty 10

## 2017-04-23 MED ORDER — SODIUM CHLORIDE 0.9 % IV SOLN
500.0000 mg | Freq: Once | INTRAVENOUS | Status: DC
  Filled 2017-04-23: qty 500

## 2017-04-23 NOTE — ED Notes (Signed)
Admitting MD at bedside.

## 2017-04-23 NOTE — ED Provider Notes (Signed)
[facility] EMERGENCY DEPARTMENT Provider Note   CSN: [id number] Arrival date & time: 04/23/17  1243     History   Chief Complaint Chief Complaint  Patient presents with  . Weakness    HPI Melissa Dalton is a 83 y.o. female.  She presents for evaluation of weakness, characterized by inability to get out of the chair she was sitting in, therefore she was incontinent of stool and soiled herself.  She was found in his condition by a home health nurse that was visiting today, for the first time.  She had been in the emergency department yesterday, and evaluated for weakness and declined admission.  The weakness has been ongoing and has resulted in falls.  5 days ago she fell during the night, and was on the floor all night because of the weakness.  At that time she refused to come in.  She lives with her elderly husband, who is also debilitated.  Family members encouraged her to come yesterday for evaluation, against her wishes.  She has been eating fairly well.  There is been no vomiting.  There is been no known fever, chills, focal weakness or paresthesia.  There are no other known modifying factors.  HPI  Past Medical History:  Diagnosis Date  . Arthritis   . Diabetes ([place])   . High cholesterol   . HTN (hypertension)     Patient Active Problem List   Diagnosis Date Noted  . Coronary artery calcification 12/20/2012  . Multiple lung nodules 10/25/2012  . Chronic cough 10/25/2012    Past Surgical History:  Procedure Laterality Date  . HERNIA REPAIR    . TOTAL ABDOMINAL HYSTERECTOMY    . TUMOR REMOVAL      OB History    No data available       Home Medications    Prior to Admission medications   Medication Sig Start Date End Date Taking? Authorizing Provider  allopurinol (ZYLOPRIM) 300 MG tablet Take 300 mg by mouth 2 (two) times daily. TAKE ONE TABLET BY MOUTH ONCE DAILY 09/09/12  Yes [provider]  aspirin 325 MG tablet Take 650 mg by  mouth daily. Take 325 mg by mouth 2 (two) times daily.     Yes [provider]  benzonatate (TESSALON) 100 MG capsule Take 1 capsule (100 mg total) by mouth every 8 (eight) hours. 04/22/17  Yes Deno Etienne, DO  Cholecalciferol (VITAMIN D) 1000 UNITS capsule 1,000 mg. Take 1,000 mg by mouth.   Yes [provider]  fenofibrate 160 MG tablet Take 160 mg by mouth daily. TAKE ONE TABLET BY MOUTH EVERY DAY 10/22/12  Yes [provider]  fexofenadine (ALLEGRA) 180 MG tablet 180 mg. Take 180 mg by mouth daily.   Yes [provider]  gabapentin (NEURONTIN) 300 MG capsule Take 300 mg by mouth 2 (two) times daily. 03/18/17  Yes [provider]  glipiZIDE (GLUCOTROL) 10 MG tablet Take 10 mg by mouth daily before breakfast. TAKE ONE TABLET BY MOUTH TWICE DAILY BEFORE A MEAL 06/30/12  Yes [provider]  metFORMIN (GLUCOPHAGE) 1000 MG tablet Take 1,000 mg by mouth 2 (two) times daily. 02/10/17  Yes [provider]  niacin (NIASPAN) 500 MG CR tablet Take 500 mg by mouth daily.  01/31/11  Yes [provider]  pantoprazole (PROTONIX) 40 MG tablet TAKE ONE TABLET BY MOUTH ONCE DAILY Patient taking differently: TAKE 40 mg TABLET BY MOUTH ONCE DAILY 10/20/13  Yes Ramaswamy, [place],  MD  potassium chloride SA (KLOR-CON M20) 20 MEQ tablet Take 20 mEq by mouth once. TAKE ONE TABLET BY MOUTH EVERY DAY. 08/06/12  Yes [provider]  pravastatin (PRAVACHOL) 40 MG tablet Take 40 mg by mouth daily. TAKE ONE TABLET BY MOUTH EVERY DAY AS DIRECTED 08/18/12  Yes [provider]  ranitidine (ZANTAC) 300 MG capsule Take 1 capsule (300 mg total) by mouth every evening. 02/08/13  Yes Brand Males, MD  valsartan (DIOVAN) 80 MG tablet Take 1 tablet (80 mg total) by mouth daily. Patient not taking: Reported on 04/23/2017 02/08/13   Brand Males, MD    Family History Family History  Problem Relation Age of Onset  . Heart disease Father   . Diabetes  Mother     Social History Social History   Tobacco Use  . Smoking status: Never Smoker  . Smokeless tobacco: Current User  Substance Use Topics  . Alcohol use: No  . Drug use: No     Allergies   Oxycodone; Penicillins; and Tramadol   Review of Systems Review of Systems  All other systems reviewed and are negative.    Physical Exam Updated Vital Signs BP (!) 111/58   Pulse (!) 101   Temp 98.1 F (36.7 C) (Oral)   Resp 18   SpO2 95%   Physical Exam  Constitutional: She is oriented to person, place, and time. She appears well-developed.  Elderly, obese  HENT:  Head: Normocephalic and atraumatic.  No injuries to scalp or cranium.  Eyes: Conjunctivae and EOM are normal. Pupils are equal, round, and reactive to light.  Neck: Normal range of motion and phonation normal. Neck supple.  Cardiovascular: Regular rhythm.  Mild tachycardia  Pulmonary/Chest: Effort normal and breath sounds normal. She exhibits no tenderness.  Abdominal: Soft. She exhibits no distension. There is no tenderness. There is no guarding.  Musculoskeletal:  Normal tone arms and legs bilaterally.  She resists movement of the left leg, actively secondary to left hip pain.  Left leg is not shortened.  Neurological: She is alert and oriented to person, place, and time. She exhibits normal muscle tone.  Skin: Skin is warm and dry.  Psychiatric: She has a normal mood and affect. Her behavior is normal.  Nursing note and vitals reviewed.    ED Treatments / Results  Labs (all labs ordered are listed, but only abnormal results are displayed) Labs Reviewed  URINALYSIS, ROUTINE W REFLEX MICROSCOPIC - Abnormal; Notable for the following components:      Result Value   Color, Urine STRAW (*)    Specific Gravity, Urine 1.004 (*)    Hgb urine dipstick SMALL (*)    Bacteria, UA RARE (*)    Squamous Epithelial / LPF 6-30 (*)    All other components within normal limits  CBC WITH DIFFERENTIAL/PLATELET -  Abnormal; Notable for the following components:   RBC 3.84 (*)    All other components within normal limits  I-STAT CHEM 8, ED - Abnormal; Notable for the following components:   Potassium 3.0 (*)    Chloride 99 (*)    Calcium, Ion 1.13 (*)    All other components within normal limits  URINE CULTURE  INFLUENZA PANEL BY PCR (TYPE A & B)  I-STAT CG4 LACTIC ACID, ED  I-STAT CG4 LACTIC ACID, ED    EKG  EKG Interpretation None       Radiology Dg Chest 2 View  Result Date: 04/23/2017 CLINICAL DATA:  Shortness of breath and  cough for 2 weeks. EXAM: CHEST - 2 VIEW COMPARISON:  PA and lateral chest 04/22/2017.  CT chest 10/07/2013. FINDINGS: The patient's left basilar airspace disease. The right lung is clear. Heart size is upper normal. No pneumothorax or pleural fluid. No acute bony abnormality. IMPRESSION: Left basilar airspace disease worrisome for pneumonia. Electronically Signed   By: Inge Rise M.D.   On: 04/23/2017 18:21   Dg Chest 2 View  Result Date: 04/22/2017 CLINICAL DATA:  Productive cough over the last 2 weeks.  Fever. EXAM: CHEST - 2 VIEW COMPARISON:  No previous chest radiography. Previous chest CT 10/07/2013 FINDINGS: Heart size is normal. The aorta shows unfolding and calcification. The patient has multiple bilateral pulmonary nodules under a cm in size, more numerous on the right than the left, unchanged when compared to previous CT studies. No sign of infiltrate, effusion or active collapse. No significant bone finding. IMPRESSION: No active disease by radiography. Multiple pulmonary nodules as seen previously, presumed benign at this point. Electronically Signed   By: Nelson Chimes M.D.   On: 04/22/2017 10:24   Dg Pelvis 1-2 Views  Result Date: 04/23/2017 CLINICAL DATA:  Left hip pain for 9 months after falling EXAM: PELVIS - 1-2 VIEW COMPARISON:  Right hip radiograph 06/04/2006 FINDINGS: There is moderate right and severe left hip joint space narrowing. There is  mixed subcortical sclerosis and lucency of the left femoral head. No acute fracture or dislocation. IMPRESSION: Moderate right and severe left hip osteoarthrosis. Electronically Signed   By: Ulyses Jarred M.D.   On: 04/23/2017 18:21   Ct Head Wo Contrast  Result Date: 04/23/2017 CLINICAL DATA:  Headache. EXAM: CT HEAD WITHOUT CONTRAST TECHNIQUE: Contiguous axial images were obtained from the base of the skull through the vertex without intravenous contrast. COMPARISON:  None. FINDINGS: Brain: No evidence of acute infarction, hemorrhage, hydrocephalus, extra-axial collection or mass lesion/mass effect. Mild cerebral atrophy, age appropriate. Mild periventricular and subcortical white matter hypodensities are nonspecific but favored to reflect chronic microvascular ischemic changes. Vascular: Atherosclerotic vascular calcification of the carotid siphons. No hyperdense vessel. Skull: Negative for fracture or focal lesion. Sinuses/Orbits: Small air-fluid levels in the left greater than right maxillary sinuses and ethmoid air cells. The mastoid air cells are clear. No acute orbital abnormality. Other: None. IMPRESSION: 1.  No acute intracranial abnormality. 2. Small air-fluid levels within the bilateral maxillary sinuses and ethmoid air cells. Correlate for acute sinusitis. Electronically Signed   By: Titus Dubin M.D.   On: 04/23/2017 17:13    Procedures Procedures (including critical care time)  Medications Ordered in ED Medications  sodium chloride 0.9 % bolus 500 mL (not administered)  0.9 %  sodium chloride infusion (not administered)  cefTRIAXone (ROCEPHIN) 1 g in sodium chloride 0.9 % 100 mL IVPB (not administered)  azithromycin (ZITHROMAX) 500 mg in sodium chloride 0.9 % 250 mL IVPB (not administered)     Initial Impression / Assessment and Plan / ED Course  I have reviewed the triage vital signs and the nursing notes.  Pertinent labs & imaging results that were available during my care  of the patient were reviewed by me and considered in my medical decision making (see chart for details).  Clinical Course as of Apr 23 2299  Wed Apr 23, 2017  2229 Evaluation for weakness, and fecal incontinence: I-STAT lactate normal 1.47, hemoglobin normal 12.7, white count normal 7, sodium normal 140, potassium low 3.0, urinalysis negative.  [EW]  2230 Chest x-ray indicates possible left  basilar pneumonia.  Pelvis x-ray indicates left hip arthritis.  CT head without apparent injury or space-occupying lesion.  [EW]    Clinical Course User Index [EW] Daleen Bo, MD     Patient Vitals for the past 24 hrs:  BP Temp Temp src Pulse Resp SpO2  04/23/17 2230 (!) 111/58 - - (!) 101 18 95 %  04/23/17 2217 - - - (!) 103 - (!) 88 %  04/23/17 2200 120/70 - - (!) 106 - (!) 87 %  04/23/17 2100 (!) 109/51 - - (!) 109 18 93 %  04/23/17 2000 123/72 - - (!) 107 - 96 %  04/23/17 1900 (!) 142/77 - - (!) 113 18 99 %  04/23/17 1400 128/84 - - 96 20 97 %  04/23/17 1245 133/74 98.1 F (36.7 C) Oral (!) 109 (!) 22 96 %    10:31 PM Reevaluation with update and discussion. After initial assessment and treatment, an updated evaluation reveals no change in clinical status, findings discussed with patient and family members, all questions answered. Daleen Bo   MDM-weakness with fecal incontinence, likely because she could not get up to go to the bathroom on her own.  She is a debilitated elderly female, with recent falls, and nonspecific global weakness.  Evaluation for causes of weakness, urinalysis negative, doubt urinary tract infection.  Chest x-ray with possible left basilar pneumonia, but the patient does not have respiratory symptoms so doubt serious bacterial infection.  Empiric antibiotics ordered.  No evidence for metabolic instability or impending vascular collapse.  She has mild persistent tachycardia, without clear cause.  Tachycardia treated with IV fluids bolus and maintenance.  Patient is at  risk for further falls, therefore will require admission, for management and consideration, for placement  10:38 PM-Consult complete with hospitalist. Patient case explained and discussed.  He agrees to admit patient for further evaluation and treatment. Call ended at 10:58 PM  Final Clinical Impressions(s) / ED Diagnoses   Final diagnoses:  Weakness    ED Discharge Orders    None       Daleen Bo, MD 04/23/17 2301

## 2017-04-23 NOTE — ED Notes (Signed)
Patient changed by Lovena Le, RN.  MD made aware of patient's urinary symptoms and frequency.  Will run urinalysis, per EDP

## 2017-04-23 NOTE — ED Notes (Signed)
Patient cleaned up.  Incontinent of urine and feces.  New gown given, bed cover changed.  Patient adjusted in bed

## 2017-04-23 NOTE — ED Notes (Signed)
Patient assisted with using bed pan.  Patient states urgency and pain with urinating.

## 2017-04-23 NOTE — ED Notes (Signed)
Patient provided with ginger ale and [place] sandwich bag.  Visitors assisted as well.

## 2017-04-23 NOTE — ED Notes (Signed)
Patient transported to CT 

## 2017-04-23 NOTE — ED Notes (Signed)
Pt given turkey sandwich and water

## 2017-04-23 NOTE — ED Notes (Signed)
Pt desat to 88% on room air while sleeping, pt placed on 2L [place] and o2 sat came up to 95%

## 2017-04-23 NOTE — ED Triage Notes (Signed)
Per EMS:  Patient from home, seen yesterday and diagnosed with "physical deconditioning" and "Viral URI with cough".  Patient states today she is unable to stand independently at home, normally able to walk and toilet independently.  Patient incontinent today due to inability to have strength to get up.  Patient encouraged for admission yesterday but refused.

## 2017-04-24 ENCOUNTER — Observation Stay (HOSPITAL_COMMUNITY): Payer: PPO

## 2017-04-24 ENCOUNTER — Other Ambulatory Visit: Payer: Self-pay

## 2017-04-24 ENCOUNTER — Encounter (HOSPITAL_COMMUNITY): Payer: Self-pay | Admitting: Internal Medicine

## 2017-04-24 DIAGNOSIS — Z7982 Long term (current) use of aspirin: Secondary | ICD-10-CM | POA: Diagnosis not present

## 2017-04-24 DIAGNOSIS — E785 Hyperlipidemia, unspecified: Secondary | ICD-10-CM | POA: Diagnosis present

## 2017-04-24 DIAGNOSIS — J189 Pneumonia, unspecified organism: Secondary | ICD-10-CM | POA: Diagnosis not present

## 2017-04-24 DIAGNOSIS — E119 Type 2 diabetes mellitus without complications: Secondary | ICD-10-CM | POA: Diagnosis present

## 2017-04-24 DIAGNOSIS — E1169 Type 2 diabetes mellitus with other specified complication: Secondary | ICD-10-CM | POA: Diagnosis not present

## 2017-04-24 DIAGNOSIS — Z7984 Long term (current) use of oral hypoglycemic drugs: Secondary | ICD-10-CM | POA: Diagnosis not present

## 2017-04-24 DIAGNOSIS — G9341 Metabolic encephalopathy: Secondary | ICD-10-CM | POA: Diagnosis present

## 2017-04-24 DIAGNOSIS — M109 Gout, unspecified: Secondary | ICD-10-CM | POA: Diagnosis present

## 2017-04-24 DIAGNOSIS — E669 Obesity, unspecified: Secondary | ICD-10-CM | POA: Diagnosis present

## 2017-04-24 DIAGNOSIS — E876 Hypokalemia: Secondary | ICD-10-CM | POA: Diagnosis present

## 2017-04-24 DIAGNOSIS — G9340 Encephalopathy, unspecified: Secondary | ICD-10-CM | POA: Diagnosis not present

## 2017-04-24 DIAGNOSIS — I10 Essential (primary) hypertension: Secondary | ICD-10-CM | POA: Diagnosis present

## 2017-04-24 DIAGNOSIS — R4182 Altered mental status, unspecified: Secondary | ICD-10-CM | POA: Diagnosis not present

## 2017-04-24 DIAGNOSIS — R531 Weakness: Secondary | ICD-10-CM

## 2017-04-24 DIAGNOSIS — Z8249 Family history of ischemic heart disease and other diseases of the circulatory system: Secondary | ICD-10-CM | POA: Diagnosis not present

## 2017-04-24 DIAGNOSIS — F17290 Nicotine dependence, other tobacco product, uncomplicated: Secondary | ICD-10-CM | POA: Diagnosis present

## 2017-04-24 DIAGNOSIS — N390 Urinary tract infection, site not specified: Secondary | ICD-10-CM | POA: Diagnosis present

## 2017-04-24 DIAGNOSIS — J111 Influenza due to unidentified influenza virus with other respiratory manifestations: Secondary | ICD-10-CM | POA: Diagnosis not present

## 2017-04-24 DIAGNOSIS — Z833 Family history of diabetes mellitus: Secondary | ICD-10-CM | POA: Diagnosis not present

## 2017-04-24 DIAGNOSIS — B9620 Unspecified Escherichia coli [E. coli] as the cause of diseases classified elsewhere: Secondary | ICD-10-CM | POA: Diagnosis present

## 2017-04-24 DIAGNOSIS — Z9181 History of falling: Secondary | ICD-10-CM | POA: Diagnosis not present

## 2017-04-24 DIAGNOSIS — J1000 Influenza due to other identified influenza virus with unspecified type of pneumonia: Secondary | ICD-10-CM | POA: Diagnosis present

## 2017-04-24 LAB — CBC
HCT: 38.1 % (ref 36.0–46.0)
HCT: 34.4 % — ABNORMAL LOW (ref 36.0–46.0)
Hemoglobin: 12.5 g/dL (ref 12.0–15.0)
Hemoglobin: 11.3 g/dL — ABNORMAL LOW (ref 12.0–15.0)
MCH: 33 pg (ref 26.0–34.0)
MCH: 32.9 pg (ref 26.0–34.0)
MCHC: 32.8 g/dL (ref 30.0–36.0)
MCHC: 32.8 g/dL (ref 30.0–36.0)
MCV: 100.3 fL — ABNORMAL HIGH (ref 78.0–100.0)
MCV: 100.5 fL — AB (ref 78.0–100.0)
PLATELETS: 147 10*3/uL — AB (ref 150–400)
PLATELETS: 154 10*3/uL (ref 150–400)
RBC: 3.79 MIL/uL — ABNORMAL LOW (ref 3.87–5.11)
RBC: 3.43 MIL/uL — ABNORMAL LOW (ref 3.87–5.11)
RDW: 14.5 % (ref 11.5–15.5)
RDW: 14.7 % (ref 11.5–15.5)
WBC: 5.8 10*3/uL (ref 4.0–10.5)
WBC: 5 10*3/uL (ref 4.0–10.5)

## 2017-04-24 LAB — HEPATIC FUNCTION PANEL
ALK PHOS: 38 U/L (ref 38–126)
ALT: 14 U/L (ref 14–54)
AST: 26 U/L (ref 15–41)
Albumin: 2.6 g/dL — ABNORMAL LOW (ref 3.5–5.0)
BILIRUBIN TOTAL: 0.6 mg/dL (ref 0.3–1.2)
Total Protein: 5.6 g/dL — ABNORMAL LOW (ref 6.5–8.1)

## 2017-04-24 LAB — CREATININE, SERUM
Creatinine, Ser: 0.74 mg/dL (ref 0.44–1.00)
GFR calc Af Amer: >60 mL/min (ref >60)
GFR calc non Af Amer: >60 mL/min (ref >60)

## 2017-04-24 LAB — BASIC METABOLIC PANEL
Anion gap: 11 (ref 5–15)
BUN: 15 mg/dL (ref 6–20)
CALCIUM: 8 mg/dL — AB (ref 8.9–10.3)
CHLORIDE: 102 mmol/L (ref 101–111)
CO2: 25 mmol/L (ref 22–32)
CREATININE: 0.77 mg/dL (ref 0.44–1.00)
GFR calc Af Amer: >60 mL/min (ref >60)
GFR calc non Af Amer: >60 mL/min (ref >60)
Glucose, Bld: 106 mg/dL — ABNORMAL HIGH (ref 65–99)
Potassium: 2.9 mmol/L — ABNORMAL LOW (ref 3.5–5.1)
SODIUM: 138 mmol/L (ref 135–145)

## 2017-04-24 LAB — GLUCOSE, CAPILLARY
GLUCOSE-CAPILLARY: 145 mg/dL — AB (ref 65–99)
Glucose-Capillary: 103 mg/dL — ABNORMAL HIGH (ref 65–99)
Glucose-Capillary: 154 mg/dL — ABNORMAL HIGH (ref 65–99)
Glucose-Capillary: 124 mg/dL — ABNORMAL HIGH (ref 65–99)

## 2017-04-24 LAB — AMMONIA: Ammonia: 26 umol/L (ref 9–35)

## 2017-04-24 LAB — INFLUENZA PANEL BY PCR (TYPE A & B)
Influenza A By PCR: NEGATIVE
Influenza B By PCR: POSITIVE — AB

## 2017-04-24 LAB — TSH: TSH: 1.176 u[IU]/mL (ref 0.350–4.500)

## 2017-04-24 LAB — STREP PNEUMONIAE URINARY ANTIGEN: STREP PNEUMO URINARY ANTIGEN: NEGATIVE

## 2017-04-24 LAB — TROPONIN I
Troponin I: <0.03 ng/mL (ref <0.03)
Troponin I: <0.03 ng/mL (ref <0.03)

## 2017-04-24 LAB — MAGNESIUM: Magnesium: 1.3 mg/dL — ABNORMAL LOW (ref 1.7–2.4)

## 2017-04-24 LAB — CG4 I-STAT (LACTIC ACID): Lactic Acid, Venous: 1.35 mmol/L (ref 0.5–1.9)

## 2017-04-24 LAB — PROCALCITONIN: Procalcitonin: 0.17 ng/mL

## 2017-04-24 MED ORDER — ZOLPIDEM TARTRATE 5 MG PO TABS
5.0000 mg | ORAL_TABLET | Freq: Once | ORAL | Status: AC
Start: 2017-04-24 — End: 2017-04-24
  Administered 2017-04-24: 5 mg via ORAL
  Filled 2017-04-24: qty 1

## 2017-04-24 MED ORDER — HYDROCODONE-ACETAMINOPHEN 5-325 MG PO TABS
2.0000 | ORAL_TABLET | Freq: Once | ORAL | Status: AC
  Administered 2017-04-24: 2 via ORAL
  Filled 2017-04-24: qty 2

## 2017-04-24 MED ORDER — ASPIRIN 325 MG PO TABS
650.0000 mg | ORAL_TABLET | Freq: Every day | ORAL | Status: DC
  Administered 2017-04-25 – 2017-04-29 (×5): 650 mg via ORAL
  Filled 2017-04-24 (×6): qty 2

## 2017-04-24 MED ORDER — FAMOTIDINE 20 MG PO TABS
20.0000 mg | ORAL_TABLET | Freq: Every day | ORAL | Status: DC
  Administered 2017-04-24 – 2017-04-28 (×6): 20 mg via ORAL
  Filled 2017-04-24 (×6): qty 1

## 2017-04-24 MED ORDER — FENOFIBRATE 160 MG PO TABS
160.0000 mg | ORAL_TABLET | Freq: Every day | ORAL | Status: DC
  Administered 2017-04-25 – 2017-04-29 (×5): 160 mg via ORAL
  Filled 2017-04-24 (×5): qty 1

## 2017-04-24 MED ORDER — INSULIN ASPART 100 UNIT/ML ~~LOC~~ SOLN
0.0000 [IU] | Freq: Three times a day (TID) | SUBCUTANEOUS | Status: DC
  Administered 2017-04-24: 1 [IU] via SUBCUTANEOUS
  Administered 2017-04-24: 2 [IU] via SUBCUTANEOUS
  Administered 2017-04-25 – 2017-04-29 (×7): 1 [IU] via SUBCUTANEOUS

## 2017-04-24 MED ORDER — ONDANSETRON HCL 4 MG/2ML IJ SOLN
4.0000 mg | Freq: Four times a day (QID) | INTRAMUSCULAR | Status: DC | PRN
  Administered 2017-04-24 (×3): 4 mg via INTRAVENOUS
  Filled 2017-04-24 (×3): qty 2

## 2017-04-24 MED ORDER — POTASSIUM CHLORIDE CRYS ER 20 MEQ PO TBCR
40.0000 meq | EXTENDED_RELEASE_TABLET | ORAL | Status: DC
  Filled 2017-04-24: qty 2

## 2017-04-24 MED ORDER — SODIUM CHLORIDE 0.9 % IV SOLN
INTRAVENOUS | Status: DC
  Administered 2017-04-24: 05:00:00 via INTRAVENOUS

## 2017-04-24 MED ORDER — ONDANSETRON HCL 4 MG PO TABS: 4.0000 mg | ORAL_TABLET | Freq: Four times a day (QID) | ORAL | Status: DC | PRN

## 2017-04-24 MED ORDER — POTASSIUM CHLORIDE 10 MEQ/100ML IV SOLN
10.0000 meq | INTRAVENOUS | Status: AC
  Administered 2017-04-24 (×3): 10 meq via INTRAVENOUS
  Filled 2017-04-24 (×3): qty 100

## 2017-04-24 MED ORDER — NIACIN ER (ANTIHYPERLIPIDEMIC) 500 MG PO TBCR
500.0000 mg | EXTENDED_RELEASE_TABLET | Freq: Every day | ORAL | Status: DC
  Administered 2017-04-25 – 2017-04-29 (×5): 500 mg via ORAL
  Filled 2017-04-24 (×6): qty 1

## 2017-04-24 MED ORDER — ACETAMINOPHEN 325 MG PO TABS
650.0000 mg | ORAL_TABLET | Freq: Four times a day (QID) | ORAL | Status: DC | PRN
Start: 2017-04-24 — End: 2017-04-29

## 2017-04-24 MED ORDER — PRAVASTATIN SODIUM 40 MG PO TABS
40.0000 mg | ORAL_TABLET | Freq: Every day | ORAL | Status: DC
  Administered 2017-04-25 – 2017-04-29 (×5): 40 mg via ORAL
  Filled 2017-04-24 (×6): qty 1

## 2017-04-24 MED ORDER — ALLOPURINOL 300 MG PO TABS
300.0000 mg | ORAL_TABLET | Freq: Two times a day (BID) | ORAL | Status: DC
  Administered 2017-04-24 – 2017-04-29 (×11): 300 mg via ORAL
  Filled 2017-04-24 (×13): qty 1

## 2017-04-24 MED ORDER — ORAL CARE MOUTH RINSE
15.0000 mL | Freq: Two times a day (BID) | OROMUCOSAL | Status: DC
  Administered 2017-04-24 – 2017-04-29 (×8): 15 mL via OROMUCOSAL

## 2017-04-24 MED ORDER — ACETAMINOPHEN 650 MG RE SUPP: 650.0000 mg | Freq: Four times a day (QID) | RECTAL | Status: DC | PRN

## 2017-04-24 MED ORDER — LEVOFLOXACIN IN D5W 750 MG/150ML IV SOLN
750.0000 mg | Freq: Every day | INTRAVENOUS | Status: DC
  Administered 2017-04-24 (×2): 750 mg via INTRAVENOUS
  Filled 2017-04-24 (×3): qty 150

## 2017-04-24 MED ORDER — PANTOPRAZOLE SODIUM 40 MG PO TBEC
40.0000 mg | DELAYED_RELEASE_TABLET | Freq: Every day | ORAL | Status: DC
  Administered 2017-04-25 – 2017-04-29 (×5): 40 mg via ORAL
  Filled 2017-04-24 (×6): qty 1

## 2017-04-24 MED ORDER — SODIUM CHLORIDE 0.9 % IV SOLN
INTRAVENOUS | Status: DC
  Administered 2017-04-24 – 2017-04-27 (×4): via INTRAVENOUS

## 2017-04-24 MED ORDER — OSELTAMIVIR PHOSPHATE 75 MG PO CAPS
75.0000 mg | ORAL_CAPSULE | Freq: Two times a day (BID) | ORAL | Status: DC
  Administered 2017-04-24 – 2017-04-25 (×3): 75 mg via ORAL
  Filled 2017-04-24 (×3): qty 1

## 2017-04-24 MED ORDER — ENOXAPARIN SODIUM 40 MG/0.4ML ~~LOC~~ SOLN
40.0000 mg | Freq: Every day | SUBCUTANEOUS | Status: DC
  Administered 2017-04-25 – 2017-04-29 (×5): 40 mg via SUBCUTANEOUS
  Filled 2017-04-24 (×5): qty 0.4

## 2017-04-24 NOTE — Progress Notes (Signed)
Patient admitted from ED to 5W08.  Patient oriented to room.  Call bell and telephone is within reach.  Explained to patient how to use phone and call bell to call nurse and nurse tech, patient indicated understanding.

## 2017-04-24 NOTE — H&P (Signed)
History and Physical    Melissa Dalton EUM:[id number] DOB: [birth year] DOA: 04/23/2017  PCP: Chesley Noon, MD  Patient coming from: Home.  Chief Complaint: Weakness and confusion.  HPI: Melissa Dalton is a 83 y.o. female with history of diabetes mellitus, hypertension, gout, hyperlipidemia was brought to the ER after patient has been having persistent weakness and confusion.  Patient was brought to the ER a day earlier for concerns for fever cough weakness.  At that time workup was largely unremarkable and patient did not want to stay back.  Patient was taken back home with the family.  Home health care went to check on the patient was found to be sitting on the chair with feces all around.  Patient was as per the daughters also has been having some hallucinations.  Patient has chronic cough.  Patient did not complain of any chest pain nausea vomiting or diarrhea.  No new medications have been started.  Yesterday when the patient came to the ER initially there was concern atrial fibrillation but on reviewing the EKG by the ER physician findings were secondary to artifact.  ED Course: In the ER patient had a chest x-ray and x-ray probe was done.  X-ray of the chest shows possibility of pneumonia.  CT of the head was unremarkable and on exam patient appears nonfocal but generally weak.  Labs show mildly elevated creatinine.  Patient was started on fluids and empiric antibiotics admitted for generalized weakness with encephalopathy.  And possible pneumonia.  Review of Systems: As per HPI, rest all negative.   Past Medical History:  Diagnosis Date  . Arthritis   . Diabetes ([place])   . High cholesterol   . HTN (hypertension)     Past Surgical History:  Procedure Laterality Date  . HERNIA REPAIR    . TOTAL ABDOMINAL HYSTERECTOMY    . TUMOR REMOVAL       reports that  has never smoked. She uses smokeless tobacco. She reports that she does not drink alcohol or use drugs.  Allergies    Allergen Reactions  . Oxycodone Nausea And Vomiting  . Penicillins Hives  . Tramadol Nausea And Vomiting    Family History  Problem Relation Age of Onset  . Heart disease Father   . Diabetes Mother     Prior to Admission medications   Medication Sig Start Date End Date Taking? Authorizing Provider  allopurinol (ZYLOPRIM) 300 MG tablet Take 300 mg by mouth 2 (two) times daily. TAKE ONE TABLET BY MOUTH ONCE DAILY 09/09/12  Yes [provider]  aspirin 325 MG tablet Take 650 mg by mouth daily. Take 325 mg by mouth 2 (two) times daily.     Yes [provider]  benzonatate (TESSALON) 100 MG capsule Take 1 capsule (100 mg total) by mouth every 8 (eight) hours. 04/22/17  Yes Deno Etienne, DO  Cholecalciferol (VITAMIN D) 1000 UNITS capsule 1,000 mg. Take 1,000 mg by mouth.   Yes [provider]  fenofibrate 160 MG tablet Take 160 mg by mouth daily. TAKE ONE TABLET BY MOUTH EVERY DAY 10/22/12  Yes [provider]  fexofenadine (ALLEGRA) 180 MG tablet 180 mg. Take 180 mg by mouth daily.   Yes [provider]  gabapentin (NEURONTIN) 300 MG capsule Take 300 mg by mouth 2 (two) times daily. 03/18/17  Yes [provider]  glipiZIDE (GLUCOTROL) 10 MG tablet Take 10 mg by mouth daily before breakfast. TAKE ONE TABLET BY MOUTH TWICE DAILY  BEFORE A MEAL 06/30/12  Yes [provider]  metFORMIN (GLUCOPHAGE) 1000 MG tablet Take 1,000 mg by mouth 2 (two) times daily. 02/10/17  Yes [provider]  niacin (NIASPAN) 500 MG CR tablet Take 500 mg by mouth daily.  01/31/11  Yes [provider]  pantoprazole (PROTONIX) 40 MG tablet TAKE ONE TABLET BY MOUTH ONCE DAILY Patient taking differently: TAKE 40 mg TABLET BY MOUTH ONCE DAILY 10/20/13  Yes Brand Males, MD  potassium chloride SA (KLOR-CON M20) 20 MEQ tablet Take 20 mEq by mouth once. TAKE ONE TABLET BY MOUTH EVERY DAY. 08/06/12  Yes [provider]  pravastatin (PRAVACHOL) 40  MG tablet Take 40 mg by mouth daily. TAKE ONE TABLET BY MOUTH EVERY DAY AS DIRECTED 08/18/12  Yes [provider]  ranitidine (ZANTAC) 300 MG capsule Take 1 capsule (300 mg total) by mouth every evening. 02/08/13  Yes Brand Males, MD  valsartan (DIOVAN) 80 MG tablet Take 1 tablet (80 mg total) by mouth daily. Patient not taking: Reported on 04/23/2017 02/08/13   Brand Males, MD    Physical Exam: Vitals:   04/23/17 2100 04/23/17 2200 04/23/17 2217 04/23/17 2230  BP: (!) 109/51 120/70  (!) 111/58  Pulse: (!) 109 (!) 106 (!) 103 (!) 101  Resp: 18   18  Temp:      TempSrc:      SpO2: 93% (!) 87% (!) 88% 95%      Constitutional: Moderately built and nourished.  Vitals:   04/23/17 2100 04/23/17 2200 04/23/17 2217 04/23/17 2230  BP: (!) 109/51 120/70  (!) 111/58  Pulse: (!) 109 (!) 106 (!) 103 (!) 101  Resp: 18   18  Temp:      TempSrc:      SpO2: 93% (!) 87% (!) 88% 95%   Eyes: Anicteric no pallor. ENMT: No discharge from the ears eyes nose or mouth. Neck: No mass felt.  No neck rigidity. Respiratory: No rhonchi or crepitations. Cardiovascular: S1-S2 heard no murmurs appreciated. Abdomen: Soft nontender bowel sounds present. Musculoskeletal: No edema.  No joint effusion. Skin: No rash.  Skin appears warm. Neurologic: Alert awake oriented to name and place.  Moves all extremities. Psychiatric: Mildly confused.   Labs on Admission: I have personally reviewed following labs and imaging studies  CBC: Recent Labs  Lab 04/22/17 1000 04/22/17 1112 04/23/17 1621 04/23/17 1631  WBC 5.4  --  7.0  --   NEUTROABS 4.1  --  4.5  --   HGB 12.5 12.6 12.7 12.9  HCT 37.5 37.0 38.4 38.0  MCV 101.6*  --  100.0  --   PLT 149*  --  154  --    Basic Metabolic Panel: Recent Labs  Lab 04/22/17 1000 04/22/17 1112 04/23/17 1631  NA 140 142 140  K 3.2* 3.2* 3.0*  CL 105 102 99*  CO2 25  --   --   GLUCOSE 95 89 67  BUN 19 21* 15  CREATININE 0.75 0.70 0.70  CALCIUM  8.5*  --   --    GFR: Estimated Creatinine Clearance: 62 mL/min (by C-G formula based on SCr of 0.7 mg/dL). Liver Function Tests: Recent Labs  Lab 04/22/17 1000  AST 35  ALT 16  ALKPHOS 45  BILITOT 0.5  PROT 6.1*  ALBUMIN 3.0*   No results for input(s): LIPASE, AMYLASE in the last 168 hours. No results for input(s): AMMONIA in the last 168 hours. Coagulation Profile: No results for input(s): INR, PROTIME  in the last 168 hours. Cardiac Enzymes: No results for input(s): CKTOTAL, CKMB, CKMBINDEX, TROPONINI in the last 168 hours. BNP (last 3 results) No results for input(s): PROBNP in the last 8760 hours. HbA1C: No results for input(s): HGBA1C in the last 72 hours. CBG: No results for input(s): GLUCAP in the last 168 hours. Lipid Profile: No results for input(s): CHOL, HDL, LDLCALC, TRIG, CHOLHDL, LDLDIRECT in the last 72 hours. Thyroid Function Tests: No results for input(s): TSH, T4TOTAL, FREET4, T3FREE, THYROIDAB in the last 72 hours. Anemia Panel: No results for input(s): VITAMINB12, FOLATE, FERRITIN, TIBC, IRON, RETICCTPCT in the last 72 hours. Urine analysis:    Component Value Date/Time   COLORURINE STRAW (A) 04/23/2017 1337   APPEARANCEUR CLEAR 04/23/2017 1337   LABSPEC 1.004 (L) 04/23/2017 1337   PHURINE 5.0 04/23/2017 1337   GLUCOSEU NEGATIVE 04/23/2017 1337   HGBUR SMALL (A) 04/23/2017 1337   BILIRUBINUR NEGATIVE 04/23/2017 1337   KETONESUR NEGATIVE 04/23/2017 1337   PROTEINUR NEGATIVE 04/23/2017 1337   NITRITE NEGATIVE 04/23/2017 1337   LEUKOCYTESUR NEGATIVE 04/23/2017 1337   Sepsis Labs: @LABRCNTIP (procalcitonin:4,lacticidven:4) )No results found for this or any previous visit (from the past 240 hour(s)).   Radiological Exams on Admission: Dg Chest 2 View  Result Date: 04/23/2017 CLINICAL DATA:  Shortness of breath and cough for 2 weeks. EXAM: CHEST - 2 VIEW COMPARISON:  PA and lateral chest 04/22/2017.  CT chest 10/07/2013. FINDINGS: The patient's left  basilar airspace disease. The right lung is clear. Heart size is upper normal. No pneumothorax or pleural fluid. No acute bony abnormality. IMPRESSION: Left basilar airspace disease worrisome for pneumonia. Electronically Signed   By: Inge Rise M.D.   On: 04/23/2017 18:21   Dg Chest 2 View  Result Date: 04/22/2017 CLINICAL DATA:  Productive cough over the last 2 weeks.  Fever. EXAM: CHEST - 2 VIEW COMPARISON:  No previous chest radiography. Previous chest CT 10/07/2013 FINDINGS: Heart size is normal. The aorta shows unfolding and calcification. The patient has multiple bilateral pulmonary nodules under a cm in size, more numerous on the right than the left, unchanged when compared to previous CT studies. No sign of infiltrate, effusion or active collapse. No significant bone finding. IMPRESSION: No active disease by radiography. Multiple pulmonary nodules as seen previously, presumed benign at this point. Electronically Signed   By: Nelson Chimes M.D.   On: 04/22/2017 10:24   Dg Pelvis 1-2 Views  Result Date: 04/23/2017 CLINICAL DATA:  Left hip pain for 9 months after falling EXAM: PELVIS - 1-2 VIEW COMPARISON:  Right hip radiograph 06/04/2006 FINDINGS: There is moderate right and severe left hip joint space narrowing. There is mixed subcortical sclerosis and lucency of the left femoral head. No acute fracture or dislocation. IMPRESSION: Moderate right and severe left hip osteoarthrosis. Electronically Signed   By: Ulyses Jarred M.D.   On: 04/23/2017 18:21   Ct Head Wo Contrast  Result Date: 04/23/2017 CLINICAL DATA:  Headache. EXAM: CT HEAD WITHOUT CONTRAST TECHNIQUE: Contiguous axial images were obtained from the base of the skull through the vertex without intravenous contrast. COMPARISON:  None. FINDINGS: Brain: No evidence of acute infarction, hemorrhage, hydrocephalus, extra-axial collection or mass lesion/mass effect. Mild cerebral atrophy, age appropriate. Mild periventricular and  subcortical white matter hypodensities are nonspecific but favored to reflect chronic microvascular ischemic changes. Vascular: Atherosclerotic vascular calcification of the carotid siphons. No hyperdense vessel. Skull: Negative for fracture or focal lesion. Sinuses/Orbits: Small air-fluid levels in the left greater than right  maxillary sinuses and ethmoid air cells. The mastoid air cells are clear. No acute orbital abnormality. Other: None. IMPRESSION: 1.  No acute intracranial abnormality. 2. Small air-fluid levels within the bilateral maxillary sinuses and ethmoid air cells. Correlate for acute sinusitis. Electronically Signed   By: Titus Dubin M.D.   On: 04/23/2017 17:13    EKG: Independently reviewed.  Sinus tachycardia.  Assessment/Plan Principal Problem:   Acute encephalopathy Active Problems:   Weakness   Diabetes mellitus type 2 in obese (HCC)   CAP (community acquired pneumonia)   HLD (hyperlipidemia)   Gout    1. Acute encephalopathy cause not clear.  Will check MRI brain ammonia levels.-Patient has possibility of pneumonia for which patient is placed on antibiotics.  Will hold gabapentin in the setting of confusion and renal failure. 2. Generalized weakness with falls -physical therapy consult.  Gentle hydration. 3. Possible pneumonia -on antibiotics for community-acquired pneumonia.  Will check pro-calcitonin and if negative will discontinue antibiotics.  Check influenza PCR urine for Legionella and strep antigen. 4. Acute renal failure likely from poor oral intake.  Gently hydrate and recheck metabolic panel. 5. Diabetes mellitus type 2 -placed on sliding scale coverage and hold oral hypoglycemics. 6. Hyperlipidemia on statins and fenofibrate. 7. History of gout.  8. History of hypertension presently not on any medications.   DVT prophylaxis: Lovenox. Code Status: Full code. Family Communication: Patient's daughters. Disposition Plan: To be determined. Consults called:  Physical therapy. Admission status: Observation.   Rise Patience MD [facility] Pager [phone].  If 7PM-7AM, please contact night-coverage [email or web address] Password [facility]  04/24/2017, 12:35 AM

## 2017-04-24 NOTE — Progress Notes (Signed)
Patient is complaining of chronic neck pain.  States that OTC meds like tylenol typically do not help much.  Paged provider K. Schorr, who ordered Norco 5-325 2 tablets PO one time dose.  Will continue to monitor.

## 2017-04-24 NOTE — Care Management Obs Status (Signed)
[facility] NOTIFICATION   Patient Details  Name: Melissa Dalton MRN: [id number] Date of Birth: 1934/07/09   Medicare Observation Status Notification Given:  Yes    Sharin Mons, RN 04/24/2017, 2:11 PM

## 2017-04-24 NOTE — Progress Notes (Signed)
Patient admitted after midnight, please see H&P.  Flu B +, also being treated for PNA.  PT/OT in AM.  Low K as well -replete IV due to vomiting after pain medications.  Eulogio Bear DO

## 2017-04-25 DIAGNOSIS — R531 Weakness: Secondary | ICD-10-CM

## 2017-04-25 DIAGNOSIS — E669 Obesity, unspecified: Secondary | ICD-10-CM

## 2017-04-25 DIAGNOSIS — E1169 Type 2 diabetes mellitus with other specified complication: Secondary | ICD-10-CM

## 2017-04-25 DIAGNOSIS — E785 Hyperlipidemia, unspecified: Secondary | ICD-10-CM

## 2017-04-25 LAB — GLUCOSE, CAPILLARY
GLUCOSE-CAPILLARY: 86 mg/dL (ref 65–99)
GLUCOSE-CAPILLARY: 113 mg/dL — AB (ref 65–99)
Glucose-Capillary: 126 mg/dL — ABNORMAL HIGH (ref 65–99)
Glucose-Capillary: 192 mg/dL — ABNORMAL HIGH (ref 65–99)

## 2017-04-25 LAB — BASIC METABOLIC PANEL
Anion gap: 11 (ref 5–15)
BUN: 15 mg/dL (ref 6–20)
CALCIUM: 7.9 mg/dL — AB (ref 8.9–10.3)
CO2: 27 mmol/L (ref 22–32)
CREATININE: 0.65 mg/dL (ref 0.44–1.00)
Chloride: 100 mmol/L — ABNORMAL LOW (ref 101–111)
GFR calc Af Amer: >60 mL/min (ref >60)
Glucose, Bld: 89 mg/dL (ref 65–99)
Potassium: 3.1 mmol/L — ABNORMAL LOW (ref 3.5–5.1)
SODIUM: 138 mmol/L (ref 135–145)

## 2017-04-25 LAB — CBC
HCT: 34.4 % — ABNORMAL LOW (ref 36.0–46.0)
Hemoglobin: 11.1 g/dL — ABNORMAL LOW (ref 12.0–15.0)
MCH: 32.2 pg (ref 26.0–34.0)
MCHC: 32.3 g/dL (ref 30.0–36.0)
MCV: 99.7 fL (ref 78.0–100.0)
PLATELETS: 171 10*3/uL (ref 150–400)
RBC: 3.45 MIL/uL — ABNORMAL LOW (ref 3.87–5.11)
RDW: 14.2 % (ref 11.5–15.5)
WBC: 5.2 10*3/uL (ref 4.0–10.5)

## 2017-04-25 LAB — URINE CULTURE

## 2017-04-25 LAB — LEGIONELLA PNEUMOPHILA SEROGP 1 UR AG: L. PNEUMOPHILA SEROGP 1 UR AG: NEGATIVE

## 2017-04-25 MED ORDER — MAGNESIUM SULFATE 2 GM/50ML IV SOLN
2.0000 g | Freq: Once | INTRAVENOUS | Status: AC
  Administered 2017-04-25: 2 g via INTRAVENOUS
  Filled 2017-04-25: qty 50

## 2017-04-25 MED ORDER — POTASSIUM CHLORIDE CRYS ER 20 MEQ PO TBCR
40.0000 meq | EXTENDED_RELEASE_TABLET | Freq: Once | ORAL | Status: AC
  Administered 2017-04-25: 40 meq via ORAL
  Filled 2017-04-25: qty 2

## 2017-04-25 MED ORDER — SODIUM CHLORIDE 0.9 % IV SOLN
1.0000 g | INTRAVENOUS | Status: DC
  Administered 2017-04-25 – 2017-04-26 (×2): 1 g via INTRAVENOUS
  Filled 2017-04-25 (×2): qty 10

## 2017-04-25 MED ORDER — OSELTAMIVIR PHOSPHATE 30 MG PO CAPS
30.0000 mg | ORAL_CAPSULE | Freq: Two times a day (BID) | ORAL | Status: AC
  Administered 2017-04-25 – 2017-04-28 (×7): 30 mg via ORAL
  Filled 2017-04-25 (×8): qty 1

## 2017-04-25 MED ORDER — SODIUM CHLORIDE 0.9 % IV SOLN
500.0000 mg | INTRAVENOUS | Status: DC
  Administered 2017-04-25 – 2017-04-26 (×2): 500 mg via INTRAVENOUS
  Filled 2017-04-25 (×2): qty 500

## 2017-04-25 NOTE — Progress Notes (Signed)
PROGRESS NOTE    Melissa Dalton [facility]  FUX:[id number] DOB: 08-15-1934 DOA: 04/23/2017 PCP: Chesley Noon, MD   Brief Narrative:  HPI On 04/24/2017 by Dr. Gean Birchwood Melissa Dalton is a 83 y.o. female with history of diabetes mellitus, hypertension, gout, hyperlipidemia was brought to the ER after patient has been having persistent weakness and confusion.  Patient was brought to the ER a day earlier for concerns for fever cough weakness.  At that time workup was largely unremarkable and patient did not want to stay back.  Patient was taken back home with the family.  Home health care went to check on the patient was found to be sitting on the chair with feces all around.  Patient was as per the daughters also has been having some hallucinations.  Patient has chronic cough.  Patient did not complain of any chest pain nausea vomiting or diarrhea.  No new medications have been started.  Yesterday when the patient came to the ER initially there was concern atrial fibrillation but on reviewing the EKG by the ER physician findings were secondary to artifact.  Assessment & Plan   Acute metabolic encephalopathy -likely secondary to CAP, influenza, UTI -CT head: no acute intracranial abnormality  -MRI brain: no acute intracranial abnormality  -Resolved, currently alert and oriented x3 -blood cultures negative to date  Generalized weakness with falls -PT/OT consulted, recommended SNF and 3in1 bedside commode, wheelchair -will consult SW  Influenza -Influenza B PCR positive -continue tamiflu  Community acquired pneumonia -CXR showed Left basilar airspace disease -placed on levaquin- will transition to azithromycin and ceftraixone -strep pneumonia and legionella urine antigens negative   Urinary tract infection -UA: rare bacteria, 0-5 WBC, negative nitrites/leukocytes  -urine culture >100K Ecoli -continue ceftriaxone   Diabetes mellitus, type II -home medications held -Continue ISS  and CBG monitoring  Hyperlipidemia  -Continue statin, fenofibrate  Gout -stable   Essential hypertension -Not on home medications -BP stable  Hypokalemia -replace and monitor   Hypomagnesemia -Replace and monitor   DVT Prophylaxis  lovenox  Code Status: Full  Family Communication: None at bedside  Disposition Plan: Admitted. Pending further improvement. SNF at discharge   Consultants None  Procedures  None  Antibiotics   Anti-infectives (From admission, onward)   Start     Dose/Rate Route Frequency Ordered Stop   04/25/17 2200  oseltamivir (TAMIFLU) capsule 30 mg     30 mg Oral 2 times daily 04/25/17 1116 04/29/17 0959   04/24/17 0915  oseltamivir (TAMIFLU) capsule 75 mg  Status:  Discontinued     75 mg Oral 2 times daily 04/24/17 0857 04/25/17 1116   04/24/17 0100  levofloxacin (LEVAQUIN) IVPB 750 mg     750 mg 100 mL/hr over 90 Minutes Intravenous Daily at bedtime 04/24/17 0036 04/28/17 2159   04/23/17 2315  cefTRIAXone (ROCEPHIN) 1 g in sodium chloride 0.9 % 100 mL IVPB     1 g 200 mL/hr over 30 Minutes Intravenous  Once 04/23/17 2300 04/24/17 0041   04/23/17 2315  azithromycin (ZITHROMAX) 500 mg in sodium chloride 0.9 % 250 mL IVPB  Status:  Discontinued     500 mg 250 mL/hr over 60 Minutes Intravenous  Once 04/23/17 2300 04/24/17 0039      Subjective:   Melissa Dalton seen and examined today.  States she is feeling mildly better but continues to feel weak. Denies chest pain, abdominal pain, nausea, vomiting, diarrhea, constipation.    Objective:   Vitals:   04/24/17  0408 04/24/17 1441 04/24/17 2159 04/25/17 0606  BP: 127/65 (!) 146/67 134/67 (!) 139/56  Pulse: 99 84 90 89  Resp: 18 18 18 17   Temp:  97.6 F (36.4 C) (!) 97.5 F (36.4 C) 97.8 F (36.6 C)  TempSrc:  Oral Oral   SpO2: 97% 98% 97% 96%  Weight: 93.7 kg (206 lb 8 oz)     Height: 5\' 2"  (1.575 m)       Intake/Output Summary (Last 24 hours) at 04/25/2017 1417 Last data filed at  04/25/2017 1117 Gross per 24 hour  Intake 950.83 ml  Output 1100 ml  Net -149.17 ml   Filed Weights   04/24/17 0408  Weight: 93.7 kg (206 lb 8 oz)    Exam  General: Well developed, well nourished, NAD, appears stated age  HEENT: NCAT, mucous membranes moist.   Neck: Supple  Cardiovascular: S1 S2 auscultated, no rubs, murmurs or gallops. Regular rate and rhythm.  Respiratory: Diminished breath sound, occ cough  Abdomen: Soft, nontender, nondistended, + bowel sounds  Extremities: warm dry without cyanosis clubbing or edema  Neuro: AAOx3, nonfocal   Psych: Normal affect and demeanor with intact judgement and insight   Data Reviewed: I have personally reviewed following labs and imaging studies  CBC: Recent Labs  Lab 04/22/17 1000  04/23/17 1621 04/23/17 1631 04/24/17 0034 04/24/17 0710 04/25/17 0415  WBC 5.4  --  7.0  --  5.8 5.0 5.2  NEUTROABS 4.1  --  4.5  --   --   --   --   HGB 12.5   < > 12.7 12.9 12.5 11.3* 11.1*  HCT 37.5   < > 38.4 38.0 38.1 34.4* 34.4*  MCV 101.6*  --  100.0  --  100.5* 100.3* 99.7  PLT 149*  --  154  --  154 147* 171   < > = values in this interval not displayed.   Basic Metabolic Panel: Recent Labs  Lab 04/22/17 1000 04/22/17 1112 04/23/17 1631 04/24/17 0034 04/24/17 0710 04/24/17 1200 04/25/17 0415  NA 140 142 140  --  138  --  138  K 3.2* 3.2* 3.0*  --  2.9*  --  3.1*  CL 105 102 99*  --  102  --  100*  CO2 25  --   --   --  25  --  27  GLUCOSE 95 89 67  --  106*  --  89  BUN 19 21* 15  --  15  --  15  CREATININE 0.75 0.70 0.70 0.74 0.77  --  0.65  CALCIUM 8.5*  --   --   --  8.0*  --  7.9*  MG  --   --   --   --   --  1.3*  --    GFR: Estimated Creatinine Clearance: 59.8 mL/min (by C-G formula based on SCr of 0.65 mg/dL). Liver Function Tests: Recent Labs  Lab 04/22/17 1000 04/24/17 0710  AST 35 26  ALT 16 14  ALKPHOS 45 38  BILITOT 0.5 0.6  PROT 6.1* 5.6*  ALBUMIN 3.0* 2.6*   No results for input(s):  LIPASE, AMYLASE in the last 168 hours. Recent Labs  Lab 04/24/17 0053  AMMONIA 26   Coagulation Profile: No results for input(s): INR, PROTIME in the last 168 hours. Cardiac Enzymes: Recent Labs  Lab 04/24/17 0034 04/24/17 0710 04/24/17 1200  TROPONINI <0.03 <0.03 <0.03   BNP (last 3 results) No results for input(s): PROBNP  in the last 8760 hours. HbA1C: No results for input(s): HGBA1C in the last 72 hours. CBG: Recent Labs  Lab 04/24/17 1231 04/24/17 1737 04/24/17 2159 04/25/17 0752 04/25/17 1215  GLUCAP 154* 145* 124* 86 113*   Lipid Profile: No results for input(s): CHOL, HDL, LDLCALC, TRIG, CHOLHDL, LDLDIRECT in the last 72 hours. Thyroid Function Tests: Recent Labs    04/24/17 0035  TSH 1.176   Anemia Panel: No results for input(s): VITAMINB12, FOLATE, FERRITIN, TIBC, IRON, RETICCTPCT in the last 72 hours. Urine analysis:    Component Value Date/Time   COLORURINE STRAW (A) 04/23/2017 1337   APPEARANCEUR CLEAR 04/23/2017 1337   LABSPEC 1.004 (L) 04/23/2017 1337   PHURINE 5.0 04/23/2017 1337   GLUCOSEU NEGATIVE 04/23/2017 1337   HGBUR SMALL (A) 04/23/2017 1337   BILIRUBINUR NEGATIVE 04/23/2017 1337   KETONESUR NEGATIVE 04/23/2017 1337   PROTEINUR NEGATIVE 04/23/2017 1337   NITRITE NEGATIVE 04/23/2017 1337   LEUKOCYTESUR NEGATIVE 04/23/2017 1337   Sepsis Labs: @LABRCNTIP (procalcitonin:4,lacticidven:4)  ) Recent Results (from the past 240 hour(s))  Urine culture     Status: Abnormal   Collection Time: 04/23/17  1:37 PM  Result Value Ref Range Status   Specimen Description URINE, CLEAN CATCH  Final   Special Requests   Final    NONE Performed at [facility], [place] [address], [place], [place] [address]    Culture >=100,000 COLONIES/mL ESCHERICHIA COLI (A)  Final   Report Status 04/25/2017 FINAL  Final   Organism ID, Bacteria ESCHERICHIA COLI (A)  Final      Susceptibility   Escherichia coli - MIC*    AMPICILLIN >=32 RESISTANT Resistant      CEFAZOLIN <=4 SENSITIVE Sensitive     CEFTRIAXONE <=1 SENSITIVE Sensitive     CIPROFLOXACIN <=0.25 SENSITIVE Sensitive     GENTAMICIN <=1 SENSITIVE Sensitive     IMIPENEM <=0.25 SENSITIVE Sensitive     NITROFURANTOIN 64 INTERMEDIATE Intermediate     TRIMETH/SULFA <=20 SENSITIVE Sensitive     AMPICILLIN/SULBACTAM >=32 RESISTANT Resistant     PIP/TAZO <=4 SENSITIVE Sensitive     Extended ESBL NEGATIVE Sensitive     * >=100,000 COLONIES/mL ESCHERICHIA COLI  Culture, blood (routine x 2) Call MD if unable to obtain prior to antibiotics being given     Status: None (Preliminary result)   Collection Time: 04/24/17 12:54 AM  Result Value Ref Range Status   Specimen Description BLOOD RIGHT FOREARM  Final   Special Requests   Final    BOTTLES DRAWN AEROBIC AND ANAEROBIC Blood Culture adequate volume   Culture   Final    NO GROWTH 1 DAY Performed at [facility], [address], [place], [place] [address]    Report Status PENDING  Incomplete  Culture, blood (routine x 2) Call MD if unable to obtain prior to antibiotics being given     Status: None (Preliminary result)   Collection Time: 04/24/17  1:05 AM  Result Value Ref Range Status   Specimen Description BLOOD LEFT HAND  Final   Special Requests   Final    BOTTLES DRAWN AEROBIC AND ANAEROBIC Blood Culture adequate volume   Culture   Final    NO GROWTH 1 DAY Performed at [facility], [place] [address] [place],  [address]    Report Status PENDING  Incomplete      Radiology Studies: Dg Chest 2 View  Result Date: 04/23/2017 CLINICAL DATA:  Shortness of breath and cough for 2  weeks. EXAM: CHEST - 2 VIEW COMPARISON:  PA and lateral chest 04/22/2017.  CT chest 10/07/2013. FINDINGS: The patient's left basilar airspace disease. The right lung is clear. Heart size is upper normal. No pneumothorax or pleural fluid. No acute bony abnormality. IMPRESSION: Left basilar airspace disease worrisome for pneumonia. Electronically  Signed   By: Inge Rise M.D.   On: 04/23/2017 18:21   Dg Pelvis 1-2 Views  Result Date: 04/23/2017 CLINICAL DATA:  Left hip pain for 9 months after falling EXAM: PELVIS - 1-2 VIEW COMPARISON:  Right hip radiograph 06/04/2006 FINDINGS: There is moderate right and severe left hip joint space narrowing. There is mixed subcortical sclerosis and lucency of the left femoral head. No acute fracture or dislocation. IMPRESSION: Moderate right and severe left hip osteoarthrosis. Electronically Signed   By: Ulyses Jarred M.D.   On: 04/23/2017 18:21   Ct Head Wo Contrast  Result Date: 04/23/2017 CLINICAL DATA:  Headache. EXAM: CT HEAD WITHOUT CONTRAST TECHNIQUE: Contiguous axial images were obtained from the base of the skull through the vertex without intravenous contrast. COMPARISON:  None. FINDINGS: Brain: No evidence of acute infarction, hemorrhage, hydrocephalus, extra-axial collection or mass lesion/mass effect. Mild cerebral atrophy, age appropriate. Mild periventricular and subcortical white matter hypodensities are nonspecific but favored to reflect chronic microvascular ischemic changes. Vascular: Atherosclerotic vascular calcification of the carotid siphons. No hyperdense vessel. Skull: Negative for fracture or focal lesion. Sinuses/Orbits: Small air-fluid levels in the left greater than right maxillary sinuses and ethmoid air cells. The mastoid air cells are clear. No acute orbital abnormality. Other: None. IMPRESSION: 1.  No acute intracranial abnormality. 2. Small air-fluid levels within the bilateral maxillary sinuses and ethmoid air cells. Correlate for acute sinusitis. Electronically Signed   By: Titus Dubin M.D.   On: 04/23/2017 17:13   Mr Brain Wo Contrast  Result Date: 04/24/2017 CLINICAL DATA:  Initial evaluation for generalized weakness, altered mental status. EXAM: MRI HEAD WITHOUT CONTRAST TECHNIQUE: Multiplanar, multiecho pulse sequences of the brain and surrounding structures were  obtained without intravenous contrast. COMPARISON:  Prior CT from 04/23/2017. FINDINGS: Brain: Diffuse prominence of the CSF containing spaces compatible with generalized age-related cerebral atrophy. Mild patchy T2/FLAIR hyperintensity within the periventricular, deep, and subcortical white matter both cerebral hemispheres most like related chronic small vessel ischemic disease, mild for age. No abnormal foci of restricted diffusion to suggest acute or subacute ischemia. Gray-white matter differentiation maintained. No evidence for acute or chronic intracranial hemorrhage. No remote cortical infarction of 5. No mass lesion, midline shift or mass effect. Mild ventricular prominence related global parenchymal volume loss of hydrocephalus. No extra-axial fluid collection. Major dural sinuses are grossly patent. Pituitary gland and suprasellar region normal. Midline structures intact and normal. Vascular: Major intracranial vascular flow voids are well maintained. Skull and upper cervical spine: Craniocervical junction within normal limits. Upper cervical spine normal. Bone marrow signal intensity within normal limits. No scalp soft tissue abnormality. Sinuses/Orbits: Globes and orbital soft tissues within normal limits. Patient status post lens extraction bilaterally. Scattered mucosal thickening throughout the paranasal sinuses. Small air-fluid levels noted within the maxillary sinuses, ethmoidal air cells, and sphenoid sinuses, suggesting possible acute sinusitis. No mastoid effusion. Inner ear structures normal. Other: None. IMPRESSION: 1. No acute intracranial abnormality identified. 2. Generalized age-related cerebral atrophy with mild chronic small vessel ischemic disease. 3. Scattered air-fluid levels within the paranasal sinuses, suggesting acute sinusitis. Electronically Signed   By: Jeannine Boga M.D.   On: 04/24/2017 04:43  Scheduled Meds: . allopurinol  300 mg Oral BID  . aspirin  650 mg  Oral Daily  . enoxaparin (LOVENOX) injection  40 mg Subcutaneous Daily  . famotidine  20 mg Oral QHS  . fenofibrate  160 mg Oral Daily  . insulin aspart  0-9 Units Subcutaneous TID WC  . mouth rinse  15 mL Mouth Rinse BID  . niacin  500 mg Oral Daily  . oseltamivir  30 mg Oral BID  . pantoprazole  40 mg Oral Daily  . pravastatin  40 mg Oral Daily   Continuous Infusions: . sodium chloride 50 mL/hr at 04/25/17 1137  . levofloxacin (LEVAQUIN) IV Stopped (04/24/17 2328)     LOS: 1 day   Time Spent in minutes   30 minutes  Abbagayle Zaragoza D.O. on 04/25/2017 at 2:17 PM  Between 7am to 7pm - Pager - [phone]  After 7pm go to [email or web address] - password [email or web address]  And look for the night coverage person covering for me after hours  [facility] Office  [phone]

## 2017-04-25 NOTE — Evaluation (Signed)
Occupational Therapy Evaluation Patient Details Name: Melissa Dalton MRN: [id number] DOB: [birth year] Today's Date: 04/25/2017    History of Present Illness 83yo female having persistent weakness and confusion, also with history of trip to ED one day earlier due to cough, fever, weakness. She was found to be covered in feces at home by home health, and family reports history of hallucinations. CT of head negative for acute changes. Diagnosed with acute encephalopathy of unknown origin, possible pneumonia. PMH OA, DM, HTN, hernia repair    Clinical Impression   Pt was walking with a walker and reports many falls prior to admission. She performed ADL independently and laundry. She had assist for meal prep. Pt presents with high anxiety with regard to OOB activity. She declined attempt to stand/transfer this visit. Pt requires set up to total assist for ADL. She will need post acute rehab in SNF. Will follow.    Follow Up Recommendations  SNF;Supervision/Assistance - 24 hour    Equipment Recommendations  3 in 1 bedside commode;Wheelchair (measurements OT);Wheelchair cushion (measurements OT)    Recommendations for Other Services       Precautions / Restrictions Precautions Precautions: Fall Restrictions Weight Bearing Restrictions: No      Mobility Bed Mobility Overal bed mobility: Needs Assistance Bed Mobility: Supine to Sit;Sit to Supine Rolling: Min assist   Supine to sit: Mod assist Sit to supine: Mod assist   General bed mobility comments: painful lower legs, avoided touching, use bed pad to assist hips to EOB and to lift LEs back into bed, pt fearful of all movement  Transfers                 General transfer comment: pt declined, fearful of falling    Balance Overall balance assessment: Needs assistance Sitting-balance support: Bilateral upper extremity supported Sitting balance-Leahy Scale: Fair Sitting balance - Comments: can prop with one hand when using  the other for ADL                                   ADL either performed or assessed with clinical judgement   ADL Overall ADL's : Needs assistance/impaired Eating/Feeding: Independent;Bed level   Grooming: Wash/dry hands;Wash/dry face;Oral care;Sitting;Supervision/safety   Upper Body Bathing: Moderate assistance;Sitting   Lower Body Bathing: Total assistance;Bed level   Upper Body Dressing : Moderate assistance;Sitting   Lower Body Dressing: Total assistance;Bed level       Toileting- Clothing Manipulation and Hygiene: Total assistance               Vision Baseline Vision/History: Wears glasses Wears Glasses: At all times Patient Visual Report: No change from baseline       Perception     Praxis      Pertinent Vitals/Pain Pain Assessment: Faces Faces Pain Scale: Hurts even more Pain Location: lower legs Pain Descriptors / Indicators: Sore Pain Intervention(s): Monitored during session;Repositioned     Hand Dominance Right   Extremity/Trunk Assessment Upper Extremity Assessment Upper Extremity Assessment: Generalized weakness(longstanding shoulder limitations, arthritic changes in hand)   Lower Extremity Assessment Lower Extremity Assessment: Defer to PT evaluation   Cervical / Trunk Assessment Cervical / Trunk Assessment: Kyphotic   Communication Communication Communication: No difficulties   Cognition Arousal/Alertness: Awake/alert Behavior During Therapy: Anxious Overall Cognitive Status: Within Functional Limits for tasks assessed  General Comments: daughter confirming pt's responses to PLOF   General Comments  patient seems very defeated and down, often makes statements such as "I'm going to be leaving in a casket" and requires Max cues and encouragement to participate today    Exercises     Shoulder Instructions      Home Living Family/patient expects to be discharged to::  Private residence Living Arrangements: Spouse/significant other Available Help at Discharge: Family;Available 24 hours/day Type of Home: House Home Access: Stairs to enter [employer] of Steps: 2-3 Entrance Stairs-Rails: None Home Layout: One level     Bathroom Shower/Tub: Other (comment)(pt does not shower)   Bathroom Toilet: Standard     Home Equipment: Walker - 2 wheels   Additional Comments: washes hair in sink      Prior Functioning/Environment Level of Independence: Needs assistance  Gait / Transfers Assistance Needed: walks with a walker, reports many falls ADL's / Homemaking Assistance Needed: can perform ADL independently, does laundry, dependent in meal prep and housekeeping   Comments: pt's husband is also ill        OT Problem List: Decreased range of motion;Decreased activity tolerance;Decreased strength;Impaired balance (sitting and/or standing);Decreased knowledge of use of DME or AE;Obesity;Impaired UE functional use;Pain      OT Treatment/Interventions: Self-care/ADL training;DME and/or AE instruction;Therapeutic activities;Patient/family education;Balance training    OT Goals(Current goals can be found in the care plan section) Acute Rehab OT Goals Patient Stated Goal: to feel better  OT Goal Formulation: With patient Time For Goal Achievement: 05/09/17 Potential to Achieve Goals: Fair ADL Goals Pt Will Perform Upper Body Dressing: with supervision;sitting Pt Will Perform Lower Body Dressing: with max assist;sit to/from stand Pt Will Transfer to Toilet: with +2 assist;with min assist;ambulating;bedside commode Additional ADL Goal #1: Pt will perform bed mobility with supervision in preparation for ADL.  OT Frequency: Min 2X/week   Barriers to D/C:            Co-evaluation              AM-PAC PT "6 Clicks" Daily Activity     Outcome Measure Help from another person eating meals?: None Help from another person taking care of  personal grooming?: A Little Help from another person toileting, which includes using toliet, bedpan, or urinal?: Total Help from another person bathing (including washing, rinsing, drying)?: A Lot Help from another person to put on and taking off regular upper body clothing?: A Lot Help from another person to put on and taking off regular lower body clothing?: Total 6 Click Score: 13   End of Session    Activity Tolerance: Patient limited by pain(anxious about movement) Patient left: in bed;with bed alarm set;with call bell/phone within reach;with family/visitor present  OT Visit Diagnosis: Muscle weakness (generalized) (M62.81);Pain                Time: 7711-6579 OT Time Calculation (min): 31 min Charges:  OT General Charges $OT Visit: 1 Visit OT Evaluation $OT Eval Moderate Complexity: 1 Mod OT Treatments $Self Care/Home Management : 8-22 mins G-Codes:     05/23/2017 Nestor Lewandowsky, OTR/L Pager: [phone]  Werner Lean, Haze Boyden May 23, 2017, 1:37 PM

## 2017-04-25 NOTE — Evaluation (Signed)
Physical Therapy Evaluation Patient Details Name: Melissa Dalton MRN: [id number] DOB: [birth year] Today's Date: 04/25/2017   History of Present Illness  83yo female having persistent weakness and confusion, also with history of trip to ED one day earlier due to cough, fever, weakness. She was found to be covered in feces at home by home health, and family reports history of hallucinations. CT of head negative for acute changes. Diagnosed with acute encephalopathy of unknown origin, possible pneumonia. PMH OA, DM, HTN, hernia repair   Clinical Impression   Patient received in bed, pleasant but seeming very defeated and "down" about her situation, often making statements like "I'm not fit to do anything" and "I'll probably be going home in a casket"; max encouragement and reassurance provided for participation in PT evaluation today, daughter assisting and attempting to motivate patient as well. She requires ModA for functional bed mobility and repeatedly declines attempting further transfers and gait today; PT also strongly recommends assist of +2 for any further mobility moving forward as well. She was left in bed with daughter present, bed alarm set, all needs otherwise met this morning. She will continue to benefit from skilled PT services in the acute setting as well as ST-SNF to further address functional deficits moving forward.     Follow Up Recommendations SNF    Equipment Recommendations  Other (comment)(defer to next venue )    Recommendations for Other Services       Precautions / Restrictions Precautions Precautions: Fall Restrictions Weight Bearing Restrictions: No      Mobility  Bed Mobility Overal bed mobility: Needs Assistance Bed Mobility: Rolling;Supine to Sit;Sit to Supine Rolling: Min assist   Supine to sit: Mod assist Sit to supine: Mod assist   General bed mobility comments: ModA to bring legs around and trunk up, patient has history of painful hips B  limiting overall mobility; requires frequent encouragement and coaching, reassurance throughout session   Transfers                 General transfer comment: patient declining/unsafe to attempt without +2 assistance   Ambulation/Gait             General Gait Details: patient declining/unsafe to attempt without +2 assistance   Stairs            Wheelchair Mobility    Modified Rankin (Stroke Patients Only)       Balance Overall balance assessment: Needs assistance Sitting-balance support: Bilateral upper extremity supported Sitting balance-Leahy Scale: Fair                                       Pertinent Vitals/Pain Pain Assessment: No/denies pain    Home Living Family/patient expects to be discharged to:: Private residence Living Arrangements: Spouse/significant other Available Help at Discharge: Family Type of Home: House Home Access: Stairs to enter Entrance Stairs-Rails: None Technical brewer of Steps: 2-3 Home Layout: One level Home Equipment: Environmental consultant - 2 wheels Additional Comments: per patient report- however note she is a somewhat poor historian and some information may be inaccurate from her report     Prior Function           Comments: unclear on how much assistance patient needed for mobility and ADLs at home      Hand Dominance        Extremity/Trunk Assessment   Upper Extremity Assessment Upper Extremity Assessment: Defer  to OT evaluation    Lower Extremity Assessment Lower Extremity Assessment: Generalized weakness    Cervical / Trunk Assessment Cervical / Trunk Assessment: Kyphotic  Communication   Communication: No difficulties  Cognition Arousal/Alertness: Awake/alert Behavior During Therapy: Flat affect;Anxious Overall Cognitive Status: Within Functional Limits for tasks assessed                                 General Comments: behaviors are [facility], however patient seems very  "defeated" and possibly depressed about her current status, states "I'll probably be leaving here in a casket"       General Comments General comments (skin integrity, edema, etc.): patient seems very defeated and down, often makes statements such as "I'm going to be leaving in a casket" and requires Max cues and encouragement to participate today    Exercises     Assessment/Plan    PT Assessment Patient needs continued PT services  PT Problem List Decreased strength;Decreased mobility;Decreased safety awareness;Decreased coordination;Obesity;Decreased activity tolerance;Decreased balance;Pain       PT Treatment Interventions DME instruction;Therapeutic activities;Gait training;Patient/family education;Therapeutic exercise;Stair training;Balance training;Functional mobility training;Neuromuscular re-education    PT Goals (Current goals can be found in the Care Plan section)  Acute Rehab PT Goals Patient Stated Goal: to feel better  PT Goal Formulation: With patient/family Time For Goal Achievement: 05/09/17 Potential to Achieve Goals: Fair    Frequency Min 2X/week   Barriers to discharge        Co-evaluation               AM-PAC PT "6 Clicks" Daily Activity  Outcome Measure Difficulty turning over in bed (including adjusting bedclothes, sheets and blankets)?: Unable Difficulty moving from lying on back to sitting on the side of the bed? : Unable Difficulty sitting down on and standing up from a chair with arms (e.g., wheelchair, bedside commode, etc,.)?: Unable Help needed moving to and from a bed to chair (including a wheelchair)?: A Lot Help needed walking in hospital room?: A Lot Help needed climbing 3-5 steps with a railing? : Total 6 Click Score: 8    End of Session Equipment Utilized During Treatment: Oxygen Activity Tolerance: Patient limited by fatigue Patient left: in bed;with bed alarm set;with call bell/phone within reach;with family/visitor present    PT Visit Diagnosis: Unsteadiness on feet (R26.81);Muscle weakness (generalized) (M62.81);History of falling (Z91.81);Adult, failure to thrive (R62.7);Difficulty in walking, not elsewhere classified (R26.2)    Time: 3888-7579 PT Time Calculation (min) (ACUTE ONLY): 48 min   Charges:   PT Evaluation $PT Eval Moderate Complexity: 1 Mod PT Treatments $Therapeutic Activity: 8-22 mins $Self Care/Home Management: 8-22   PT G Codes:        Deniece Ree PT, DPT, CBIS  Supplemental Physical Therapist [place]   Pager [phone]

## 2017-04-25 NOTE — Progress Notes (Signed)
CSW received consult regarding PT recommendation of SNF at discharge.  Patient is refusing SNF and states she will go home with her husband. CSW inquired about home health PT. Patient stated she would think about it, but does not like it when therapy tries to work with her. She gave CSW permission to call her daughter, [name], to confirm plan. CSW left voicemail for daughter.  CSW signing off.   Percell Locus Sharisse Rantz LCSWA [phone]

## 2017-04-25 NOTE — Clinical Social Work Note (Signed)
Clinical Social Work Assessment  Patient Details  Name: Melissa Dalton MRN: [id number] Date of Birth: 05/31/1934  Date of referral:  04/25/17               Reason for consult:  Facility Placement                Permission sought to share information with:  Facility Sport and exercise psychologist, Family Supports Permission granted to share information::  Yes, Verbal Permission Granted  Name::     Sports coach::  SNFs  Relationship::  Daughter  Contact Information:     Housing/Transportation Living arrangements for the past 2 months:  Single Family Home Source of Information:  Patient, Adult Children Patient Interpreter Needed:  None Criminal Activity/Legal Involvement Pertinent to Current Situation/Hospitalization:  No - Comment as needed Significant Relationships:  Adult Children, Spouse Lives with:  Spouse Do you feel safe going back to the place where you live?  Yes Need for family participation in patient care:  No (Coment)  Care giving concerns:  CSW received consult for possible SNF placement at time of discharge. CSW spoke with patient regarding PT recommendation of SNF placement at time of discharge. Patient reported that she lives with her husband and wants to discharge home. She gave CSW permission to speak with her daughter, [name]. [name] states that patient cannot return home in her current state. CSW encouraged her to convince patient to go short term. CSW to continue to follow and assist with discharge planning needs.   Social Worker assessment / plan:  CSW spoke with patient and her daughter concerning possibility of rehab at [facility] before returning home.  Employment status:  Retired Nurse, adult PT Recommendations:  [place] / Referral to community resources:  [place]  Patient/Family's Response to care:  Patient recognizes need for rehab before returning home but is not agreeable to a SNF. Patient's  daughter stated she will convince patient because she is not able to help patient at home. She stated patient will not walk due to fear of falling. CSW alerted [name] that insurance will not pay if patient is not participating in therapy. [name] reported preference for [employer] or [employer]. CSW will send referral out just in case.  Patient/Family's Understanding of and Emotional Response to Diagnosis, Current Treatment, and Prognosis:  Patient/family is realistic regarding therapy needs and expressed being hopeful for return home. Patient expressed understanding of CSW role and discharge process as well as medical condition. No questions/concerns about plan or treatment.    Emotional Assessment Appearance:  Appears stated age Attitude/Demeanor/Rapport:  Other(Pleasant) Affect (typically observed):  Appropriate, Pleasant Orientation:  Oriented to Self, Oriented to Situation, Oriented to Place, Oriented to  Time Alcohol / Substance use:  Not Applicable Psych involvement (Current and /or in the community):  No (Comment)  Discharge Needs  Concerns to be addressed:  Care Coordination Readmission within the last 30 days:  No Current discharge risk:  Physical Impairment Barriers to Discharge:  Continued Medical Work up   [employer], [place] 04/25/2017, 3:26 PM

## 2017-04-25 NOTE — Consult Note (Signed)
[facility] CM Primary Care Navigator  04/25/2017  Hanifa Antonetti [facility] [place] January 31, 1935 [id number]   Met with patientand daughter Baker Janus) at the bedside to identify possible discharge needs. Daughter states that patient had "generalized weakness, fall episode, coughing and disorientation/ confusion that had led to this admission.  Patientendorses Dr. Anastasia Pall and Vicenta Aly, NP with [facility] [place] as theprimary care providers.   [facility] [place] [facility] [place].  Patient has been managing her medications at homestraight out of the containers, with plan to use "pill box" when discharged home.  Patient has been driving prior to admission, but daughter [name]) has been providingtransportation to her doctors'appointments.  According to daughter, both patient and husband Jeralyn Ruths) live at home and daughters provide assistance with their needs at home when needed.  Per patient's daughter, anticipated discharge planispossibility of going to SNF (skilled nursing facility) or rehab facility as patient is needing higher level of care. Per Inpatient social worker note, patient was refusing skilled nursing facility and wanting to go home. Reinforced with patient the benefits and advantages of getting rehabilitation first prior to returning back home. Patient states she will think about it.  Patient and daughter voiced understandingto call primary care provider's office when shereturns backhome, for a post discharge follow-up appointment within1- 2weeksor sooner if needs arise.Patient letter (with PCP's contact number) was provided astheir reminder.  Discussed withpatientabout [facility] CM services available forhealthmanagement at home and she expressed understanding to seek referral to [facility] care managementfrom primary care providerif deemed necessary andappropriate for services in the  future- once discharged home.  [facility] care management information provided for future needs that patientmay have.     For additional questions please contact:  Edwena Felty A. Gricel Copen, BSN, RN-BC [facility] PRIMARY CARE Navigator Cell: [phone]

## 2017-04-25 NOTE — NC FL2 (Signed)
[place] LEVEL OF CARE SCREENING TOOL     IDENTIFICATION  Patient Name: Melissa Dalton Birthdate: Jan 04, 1935 Sex: female Admission Date (Current Location): 04/23/2017  [facility] and [place] Number:  Herbalist and Address:  [facility], [place] [address], [place], [place] [address]      Provider Number: [id number]  Attending Physician Name and Address:  Cristal Ford, DO  Relative Name and Phone Number:  [name], daughter, [phone]    Current Level of Care: Hospital Recommended Level of Care: [place] Prior Approval Number:    Date Approved/Denied:   PASRR Number: [id number]  Discharge Plan: ICF    Current Diagnoses: Patient Active Problem List   Diagnosis Date Noted  . Acute encephalopathy 04/24/2017  . Weakness 04/24/2017  . Diabetes mellitus type 2 in obese ([place]) 04/24/2017  . CAP (community acquired pneumonia) 04/24/2017  . HLD (hyperlipidemia) 04/24/2017  . Gout 04/24/2017  . Coronary artery calcification 12/20/2012  . Multiple lung nodules 10/25/2012  . Chronic cough 10/25/2012    Orientation RESPIRATION BLADDER Height & Weight     Self, Time, Situation, Place  O2(Nasal cannula 2L) Incontinent, External catheter Weight: 93.7 kg (206 lb 8 oz) Height:  5\' 2"  (157.5 cm)  BEHAVIORAL SYMPTOMS/MOOD NEUROLOGICAL BOWEL NUTRITION STATUS      Continent Diet(Please see DC Summary)  AMBULATORY STATUS COMMUNICATION OF NEEDS Skin   Limited Assist Verbally Normal                       Personal Care Assistance Level of Assistance  Bathing, Feeding, Dressing Bathing Assistance: Maximum assistance Feeding assistance: Independent Dressing Assistance: Limited assistance     Functional Limitations Info  Sight, Hearing, Speech Sight Info: Adequate Hearing Info: Adequate Speech Info: Adequate    SPECIAL CARE FACTORS FREQUENCY  PT (By licensed PT), OT (By licensed OT)     PT Frequency:  5x/week OT Frequency: 3x/week            Contractures      Additional Factors Info  Code Status, Allergies, Insulin Sliding Scale Code Status Info: Full Allergies Info: Oxycodone, Penicillins, Tramadol   Insulin Sliding Scale Info: 3x daily with meals       Current Medications (04/25/2017):  This is the current hospital active medication list Current Facility-Administered Medications  Medication Dose Route Frequency Provider Last Rate Last Dose  . 0.9 %  sodium chloride infusion   Intravenous Continuous Eulogio Bear U, DO 50 mL/hr at 04/25/17 1137    . acetaminophen (TYLENOL) tablet 650 mg  650 mg Oral Q6H PRN Rise Patience, MD       Or  . acetaminophen (TYLENOL) suppository 650 mg  650 mg Rectal Q6H PRN Rise Patience, MD      . allopurinol (ZYLOPRIM) tablet 300 mg  300 mg Oral BID Rise Patience, MD   300 mg at 04/25/17 0830  . aspirin tablet 650 mg  650 mg Oral Daily Rise Patience, MD   650 mg at 04/25/17 0831  . azithromycin (ZITHROMAX) 500 mg in sodium chloride 0.9 % 250 mL IVPB  500 mg Intravenous Q24H Mikhail, [place], DO      . cefTRIAXone (ROCEPHIN) 1 g in sodium chloride 0.9 % 100 mL IVPB  1 g Intravenous Q24H Mikhail, Maryann, DO      . enoxaparin (LOVENOX) injection 40 mg  40 mg Subcutaneous Daily Rise Patience, MD   40 mg  at 04/25/17 0831  . famotidine (PEPCID) tablet 20 mg  20 mg Oral QHS Rise Patience, MD   20 mg at 04/24/17 2154  . fenofibrate tablet 160 mg  160 mg Oral Daily Rise Patience, MD   160 mg at 04/25/17 3709  . insulin aspart (novoLOG) injection 0-9 Units  0-9 Units Subcutaneous TID WC Rise Patience, MD   1 Units at 04/24/17 1807  . MEDLINE mouth rinse  15 mL Mouth Rinse BID Rise Patience, MD   15 mL at 04/25/17 6438  . niacin (NIASPAN) CR tablet 500 mg  500 mg Oral Daily Rise Patience, MD   500 mg at 04/25/17 3818  . ondansetron (ZOFRAN) tablet 4 mg  4 mg Oral Q6H PRN Rise Patience, MD       Or  . ondansetron [facility]) injection 4 mg  4 mg Intravenous Q6H PRN Rise Patience, MD   4 mg at 04/24/17 2154  . oseltamivir (TAMIFLU) capsule 30 mg  30 mg Oral BID Bajbus, Lauren D, RPH      . pantoprazole (PROTONIX) EC tablet 40 mg  40 mg Oral Daily Rise Patience, MD   40 mg at 04/25/17 4037  . pravastatin (PRAVACHOL) tablet 40 mg  40 mg Oral Daily Rise Patience, MD   40 mg at 04/25/17 5436     Discharge Medications: Please see discharge summary for a list of discharge medications.  Relevant Imaging Results:  Relevant Lab Results:   Additional Information SSN: [id number]      Pt on tamiflu until 3/26  Benard Halsted, LCSWA

## 2017-04-26 LAB — GLUCOSE, CAPILLARY
GLUCOSE-CAPILLARY: 129 mg/dL — AB (ref 65–99)
GLUCOSE-CAPILLARY: 117 mg/dL — AB (ref 65–99)
GLUCOSE-CAPILLARY: 119 mg/dL — AB (ref 65–99)
Glucose-Capillary: 108 mg/dL — ABNORMAL HIGH (ref 65–99)

## 2017-04-26 LAB — CBC
HCT: 34 % — ABNORMAL LOW (ref 36.0–46.0)
HEMOGLOBIN: 11.2 g/dL — AB (ref 12.0–15.0)
MCH: 33.3 pg (ref 26.0–34.0)
MCHC: 32.9 g/dL (ref 30.0–36.0)
MCV: 101.2 fL — AB (ref 78.0–100.0)
Platelets: 189 10*3/uL (ref 150–400)
RBC: 3.36 MIL/uL — ABNORMAL LOW (ref 3.87–5.11)
RDW: 14.4 % (ref 11.5–15.5)
WBC: 5.2 10*3/uL (ref 4.0–10.5)

## 2017-04-26 LAB — BASIC METABOLIC PANEL
Anion gap: 12 (ref 5–15)
BUN: 9 mg/dL (ref 6–20)
CHLORIDE: 100 mmol/L — AB (ref 101–111)
CO2: 26 mmol/L (ref 22–32)
CREATININE: 0.61 mg/dL (ref 0.44–1.00)
Calcium: 7.9 mg/dL — ABNORMAL LOW (ref 8.9–10.3)
GFR calc Af Amer: >60 mL/min (ref >60)
GFR calc non Af Amer: >60 mL/min (ref >60)
GLUCOSE: 104 mg/dL — AB (ref 65–99)
POTASSIUM: 3.3 mmol/L — AB (ref 3.5–5.1)
SODIUM: 138 mmol/L (ref 135–145)

## 2017-04-26 LAB — MAGNESIUM: Magnesium: 1.6 mg/dL — ABNORMAL LOW (ref 1.7–2.4)

## 2017-04-26 MED ORDER — POTASSIUM CHLORIDE CRYS ER 20 MEQ PO TBCR
40.0000 meq | EXTENDED_RELEASE_TABLET | Freq: Once | ORAL | Status: AC
  Administered 2017-04-26: 40 meq via ORAL
  Filled 2017-04-26: qty 2

## 2017-04-26 NOTE — Progress Notes (Signed)
PROGRESS NOTE    Melissa Dalton [facility]  WJX:[id number] DOB: 18-Apr-1934 DOA: 04/23/2017 PCP: Chesley Noon, MD   Brief Narrative:  HPI On 04/24/2017 by Dr. Gean Birchwood Melissa Dalton is a 83 y.o. female with history of diabetes mellitus, hypertension, gout, hyperlipidemia was brought to the ER after patient has been having persistent weakness and confusion.  Patient was brought to the ER a day earlier for concerns for fever cough weakness.  At that time workup was largely unremarkable and patient did not want to stay back.  Patient was taken back home with the family.  Home health care went to check on the patient was found to be sitting on the chair with feces all around.  Patient was as per the daughters also has been having some hallucinations.  Patient has chronic cough.  Patient did not complain of any chest pain nausea vomiting or diarrhea.  No new medications have been started.  Yesterday when the patient came to the ER initially there was concern atrial fibrillation but on reviewing the EKG by the ER physician findings were secondary to artifact.  Interim history Admitted for acute encephalopathy likely secondary to pneumonia, influenza, UTI.  PT examined patient recommending SNF.  Assessment & Plan   Acute metabolic encephalopathy -likely secondary to CAP, influenza, UTI -CT head: no acute intracranial abnormality  -MRI brain: no acute intracranial abnormality  -Resolved, currently alert and oriented x3 -blood cultures negative to date -urine culture >100K Ecoli -treat underlying conditions  Generalized weakness with falls -PT/OT consulted, recommended SNF and 3in1 bedside commode, wheelchair -SW consulted for SNF placement -Discussed with patient and daughter at bedside.  Daughter states patient cannot return home and current condition and agrees to SNF placement.  Influenza -Influenza B PCR positive -continue tamiflu  Community acquired pneumonia -CXR showed Left  basilar airspace disease -placed on levaquin- transitioned to azithromycin and ceftraixone -strep pneumonia and legionella urine antigens negative  -will monitor O2 saturations with and without oxygen -patient does not use oxygen at home  Urinary tract infection -UA: rare bacteria, 0-5 WBC, negative nitrites/leukocytes  -urine culture >100K Ecoli -continue ceftriaxone   Diabetes mellitus, type II -home medications held -CBG stable  -Continue ISS and CBG monitoring  Hyperlipidemia  -Continue statin, fenofibrate  Gout -stable   Essential hypertension -Not on home medications -BP stable, if needed will add on medications  Hypokalemia -Continue to replace and monitor BMP  Hypomagnesemia -Replace and monitor   DVT Prophylaxis  lovenox  Code Status: Full  Family Communication: Daughter at bedside  Disposition Plan: Admitted. Pending further improvement and SNF placement  Consultants None  Procedures  None  Antibiotics   Anti-infectives (From admission, onward)   Start     Dose/Rate Route Frequency Ordered Stop   04/25/17 2200  oseltamivir (TAMIFLU) capsule 30 mg     30 mg Oral 2 times daily 04/25/17 1116 04/29/17 0959   04/25/17 2200  cefTRIAXone (ROCEPHIN) 1 g in sodium chloride 0.9 % 100 mL IVPB     1 g 200 mL/hr over 30 Minutes Intravenous Every 24 hours 04/25/17 1443     04/25/17 2200  azithromycin (ZITHROMAX) 500 mg in sodium chloride 0.9 % 250 mL IVPB     500 mg 250 mL/hr over 60 Minutes Intravenous Every 24 hours 04/25/17 1443     04/24/17 0915  oseltamivir (TAMIFLU) capsule 75 mg  Status:  Discontinued     75 mg Oral 2 times daily 04/24/17 0857 04/25/17 1116  04/24/17 0100  levofloxacin (LEVAQUIN) IVPB 750 mg  Status:  Discontinued     750 mg 100 mL/hr over 90 Minutes Intravenous Daily at bedtime 04/24/17 0036 04/25/17 1443   04/23/17 2315  cefTRIAXone (ROCEPHIN) 1 g in sodium chloride 0.9 % 100 mL IVPB     1 g 200 mL/hr over 30 Minutes Intravenous   Once 04/23/17 2300 04/24/17 0041   04/23/17 2315  azithromycin (ZITHROMAX) 500 mg in sodium chloride 0.9 % 250 mL IVPB  Status:  Discontinued     500 mg 250 mL/hr over 60 Minutes Intravenous  Once 04/23/17 2300 04/24/17 0039      Subjective:   Melissa Dalton seen and examined today.  Patient states she is feeling mildly better today.  Continues to have cough.  Denies any current chest pain, abdominal pain, nausea or vomiting, diarrhea or constipation.  Patient does not wear oxygen at home.  Objective:   Vitals:   04/25/17 0606 04/25/17 1444 04/25/17 2122 04/26/17 0535  BP: (!) 139/56 (!) 154/70 (!) 146/59 137/71  Pulse: 89 (!) 103 98 88  Resp: 17 18 18 18   Temp: 97.8 F (36.6 C) 97.7 F (36.5 C) 98.6 F (37 C) 97.7 F (36.5 C)  TempSrc:  Oral Oral Oral  SpO2: 96% 98% 98% 98%  Weight:      Height:        Intake/Output Summary (Last 24 hours) at 04/26/2017 1142 Last data filed at 04/26/2017 0532 Gross per 24 hour  Intake 570 ml  Output 900 ml  Net -330 ml   Filed Weights   04/24/17 0408  Weight: 93.7 kg (206 lb 8 oz)   Exam  General: Well developed, well nourished, NAD, appears stated age  66: NCAT, mucous membranes moist.   Neck: Supple  Cardiovascular: S1 S2 auscultated, RRR, no murmur  Respiratory: Dimished breath sounds, mild exp wheezing, +cough  Abdomen: Soft, nontender, nondistended, + bowel sounds  Extremities: warm dry without cyanosis clubbing or edema  Neuro: AAOx3, nonfocal  Psych: Appropriate mood and affect, pleasant   Data Reviewed: I have personally reviewed following labs and imaging studies  CBC: Recent Labs  Lab 04/22/17 1000  04/23/17 1621 04/23/17 1631 04/24/17 0034 04/24/17 0710 04/25/17 0415 04/26/17 0518  WBC 5.4  --  7.0  --  5.8 5.0 5.2 5.2  NEUTROABS 4.1  --  4.5  --   --   --   --   --   HGB 12.5   < > 12.7 12.9 12.5 11.3* 11.1* 11.2*  HCT 37.5   < > 38.4 38.0 38.1 34.4* 34.4* 34.0*  MCV 101.6*  --  100.0  --   100.5* 100.3* 99.7 101.2*  PLT 149*  --  154  --  154 147* 171 189   < > = values in this interval not displayed.   Basic Metabolic Panel: Recent Labs  Lab 04/22/17 1000 04/22/17 1112 04/23/17 1631 04/24/17 0034 04/24/17 0710 04/24/17 1200 04/25/17 0415 04/26/17 0518  NA 140 142 140  --  138  --  138 138  K 3.2* 3.2* 3.0*  --  2.9*  --  3.1* 3.3*  CL 105 102 99*  --  102  --  100* 100*  CO2 25  --   --   --  25  --  27 26  GLUCOSE 95 89 67  --  106*  --  89 104*  BUN 19 21* 15  --  15  --  15  9  CREATININE 0.75 0.70 0.70 0.74 0.77  --  0.65 0.61  CALCIUM 8.5*  --   --   --  8.0*  --  7.9* 7.9*  MG  --   --   --   --   --  1.3*  --   --    GFR: Estimated Creatinine Clearance: 59.8 mL/min (by C-G formula based on SCr of 0.61 mg/dL). Liver Function Tests: Recent Labs  Lab 04/22/17 1000 04/24/17 0710  AST 35 26  ALT 16 14  ALKPHOS 45 38  BILITOT 0.5 0.6  PROT 6.1* 5.6*  ALBUMIN 3.0* 2.6*   No results for input(s): LIPASE, AMYLASE in the last 168 hours. Recent Labs  Lab 04/24/17 0053  AMMONIA 26   Coagulation Profile: No results for input(s): INR, PROTIME in the last 168 hours. Cardiac Enzymes: Recent Labs  Lab 04/24/17 0034 04/24/17 0710 04/24/17 1200  TROPONINI <0.03 <0.03 <0.03   BNP (last 3 results) No results for input(s): PROBNP in the last 8760 hours. HbA1C: No results for input(s): HGBA1C in the last 72 hours. CBG: Recent Labs  Lab 04/25/17 0752 04/25/17 1215 04/25/17 1644 04/25/17 2121 04/26/17 0800  GLUCAP 86 113* 126* 192* 108*   Lipid Profile: No results for input(s): CHOL, HDL, LDLCALC, TRIG, CHOLHDL, LDLDIRECT in the last 72 hours. Thyroid Function Tests: Recent Labs    04/24/17 0035  TSH 1.176   Anemia Panel: No results for input(s): VITAMINB12, FOLATE, FERRITIN, TIBC, IRON, RETICCTPCT in the last 72 hours. Urine analysis:    Component Value Date/Time   COLORURINE STRAW (A) 04/23/2017 1337   APPEARANCEUR CLEAR 04/23/2017  1337   LABSPEC 1.004 (L) 04/23/2017 1337   PHURINE 5.0 04/23/2017 1337   GLUCOSEU NEGATIVE 04/23/2017 1337   HGBUR SMALL (A) 04/23/2017 1337   BILIRUBINUR NEGATIVE 04/23/2017 1337   KETONESUR NEGATIVE 04/23/2017 1337   PROTEINUR NEGATIVE 04/23/2017 1337   NITRITE NEGATIVE 04/23/2017 1337   LEUKOCYTESUR NEGATIVE 04/23/2017 1337   Sepsis Labs: @LABRCNTIP (procalcitonin:4,lacticidven:4)  ) Recent Results (from the past 240 hour(s))  Urine culture     Status: Abnormal   Collection Time: 04/23/17  1:37 PM  Result Value Ref Range Status   Specimen Description URINE, CLEAN CATCH  Final   Special Requests   Final    NONE Performed at [facility], [place] [address] [place], [place] [address]    Culture >=100,000 COLONIES/mL ESCHERICHIA COLI (A)  Final   Report Status 04/25/2017 FINAL  Final   Organism ID, Bacteria ESCHERICHIA COLI (A)  Final      Susceptibility   Escherichia coli - MIC*    AMPICILLIN >=32 RESISTANT Resistant     CEFAZOLIN <=4 SENSITIVE Sensitive     CEFTRIAXONE <=1 SENSITIVE Sensitive     CIPROFLOXACIN <=0.25 SENSITIVE Sensitive     GENTAMICIN <=1 SENSITIVE Sensitive     IMIPENEM <=0.25 SENSITIVE Sensitive     NITROFURANTOIN 64 INTERMEDIATE Intermediate     TRIMETH/SULFA <=20 SENSITIVE Sensitive     AMPICILLIN/SULBACTAM >=32 RESISTANT Resistant     PIP/TAZO <=4 SENSITIVE Sensitive     Extended ESBL NEGATIVE Sensitive     * >=100,000 COLONIES/mL ESCHERICHIA COLI  Culture, blood (routine x 2) Call MD if unable to obtain prior to antibiotics being given     Status: None (Preliminary result)   Collection Time: 04/24/17 12:54 AM  Result Value Ref Range Status   Specimen Description BLOOD RIGHT FOREARM  Final   Special Requests   Final  BOTTLES DRAWN AEROBIC AND ANAEROBIC Blood Culture adequate volume   Culture   Final    NO GROWTH 1 DAY Performed at [facility], [place] [address] [place], [place] [address]    Report Status PENDING  Incomplete    Culture, blood (routine x 2) Call MD if unable to obtain prior to antibiotics being given     Status: None (Preliminary result)   Collection Time: 04/24/17  1:05 AM  Result Value Ref Range Status   Specimen Description BLOOD LEFT HAND  Final   Special Requests   Final    BOTTLES DRAWN AEROBIC AND ANAEROBIC Blood Culture adequate volume   Culture   Final    NO GROWTH 1 DAY Performed at [facility], [place] [address], [place], [place] [address]    Report Status PENDING  Incomplete      Radiology Studies: No results found.   Scheduled Meds: . allopurinol  300 mg Oral BID  . aspirin  650 mg Oral Daily  . enoxaparin (LOVENOX) injection  40 mg Subcutaneous Daily  . famotidine  20 mg Oral QHS  . fenofibrate  160 mg Oral Daily  . insulin aspart  0-9 Units Subcutaneous TID WC  . mouth rinse  15 mL Mouth Rinse BID  . niacin  500 mg Oral Daily  . oseltamivir  30 mg Oral BID  . pantoprazole  40 mg Oral Daily  . pravastatin  40 mg Oral Daily   Continuous Infusions: . sodium chloride 50 mL/hr at 04/26/17 0611  . azithromycin Stopped (04/25/17 2133)  . cefTRIAXone (ROCEPHIN)  IV Stopped (04/25/17 2103)     LOS: 2 days   Time Spent in minutes   30 minutes  Juanjesus Pepperman D.O. on 04/26/2017 at 11:42 AM  Between 7am to 7pm - Pager - [phone]  After 7pm go to [email or web address] - password [email or web address]  And look for the night coverage person covering for me after hours  [facility] Office  [phone]

## 2017-04-27 LAB — CBC
HCT: 33.8 % — ABNORMAL LOW (ref 36.0–46.0)
HEMOGLOBIN: 11 g/dL — AB (ref 12.0–15.0)
MCH: 32.8 pg (ref 26.0–34.0)
MCHC: 32.5 g/dL (ref 30.0–36.0)
MCV: 100.9 fL — ABNORMAL HIGH (ref 78.0–100.0)
Platelets: 204 10*3/uL (ref 150–400)
RBC: 3.35 MIL/uL — AB (ref 3.87–5.11)
RDW: 14 % (ref 11.5–15.5)
WBC: 5.1 10*3/uL (ref 4.0–10.5)

## 2017-04-27 LAB — BASIC METABOLIC PANEL
Anion gap: 12 (ref 5–15)
BUN: 6 mg/dL (ref 6–20)
CHLORIDE: 101 mmol/L (ref 101–111)
CO2: 26 mmol/L (ref 22–32)
CREATININE: 0.7 mg/dL (ref 0.44–1.00)
Calcium: 8.3 mg/dL — ABNORMAL LOW (ref 8.9–10.3)
GFR calc non Af Amer: >60 mL/min (ref >60)
Glucose, Bld: 119 mg/dL — ABNORMAL HIGH (ref 65–99)
Potassium: 3.5 mmol/L (ref 3.5–5.1)
SODIUM: 139 mmol/L (ref 135–145)

## 2017-04-27 LAB — GLUCOSE, CAPILLARY
GLUCOSE-CAPILLARY: 139 mg/dL — AB (ref 65–99)
Glucose-Capillary: 106 mg/dL — ABNORMAL HIGH (ref 65–99)
Glucose-Capillary: 120 mg/dL — ABNORMAL HIGH (ref 65–99)
Glucose-Capillary: 188 mg/dL — ABNORMAL HIGH (ref 65–99)

## 2017-04-27 LAB — MAGNESIUM: MAGNESIUM: 1.5 mg/dL — AB (ref 1.7–2.4)

## 2017-04-27 MED ORDER — POTASSIUM CHLORIDE CRYS ER 20 MEQ PO TBCR
40.0000 meq | EXTENDED_RELEASE_TABLET | Freq: Once | ORAL | Status: AC
  Administered 2017-04-27: 40 meq via ORAL
  Filled 2017-04-27: qty 2

## 2017-04-27 MED ORDER — MAGNESIUM SULFATE 2 GM/50ML IV SOLN
2.0000 g | Freq: Once | INTRAVENOUS | Status: AC
  Administered 2017-04-27: 2 g via INTRAVENOUS
  Filled 2017-04-27: qty 50

## 2017-04-27 MED ORDER — ZOLPIDEM TARTRATE 5 MG PO TABS
5.0000 mg | ORAL_TABLET | Freq: Every evening | ORAL | Status: DC | PRN
  Administered 2017-04-27 – 2017-04-28 (×2): 5 mg via ORAL
  Filled 2017-04-27 (×2): qty 1

## 2017-04-27 MED ORDER — AZITHROMYCIN 500 MG PO TABS
500.0000 mg | ORAL_TABLET | Freq: Every day | ORAL | Status: DC
  Administered 2017-04-27 – 2017-04-29 (×3): 500 mg via ORAL
  Filled 2017-04-27 (×3): qty 1

## 2017-04-27 MED ORDER — CEPHALEXIN 500 MG PO CAPS
500.0000 mg | ORAL_CAPSULE | Freq: Two times a day (BID) | ORAL | Status: DC
  Administered 2017-04-27 – 2017-04-29 (×4): 500 mg via ORAL
  Filled 2017-04-27 (×4): qty 1

## 2017-04-27 NOTE — Progress Notes (Signed)
PROGRESS NOTE    Melissa Dalton [facility]  OAC:[id number] DOB: 03-24-1934 DOA: 04/23/2017 PCP: Chesley Noon, MD   Brief Narrative:  HPI On 04/24/2017 by Dr. Gean Birchwood Melissa Dalton is a 83 y.o. female with history of diabetes mellitus, hypertension, gout, hyperlipidemia was brought to the ER after patient has been having persistent weakness and confusion.  Patient was brought to the ER a day earlier for concerns for fever cough weakness.  At that time workup was largely unremarkable and patient did not want to stay back.  Patient was taken back home with the family.  Home health care went to check on the patient was found to be sitting on the chair with feces all around.  Patient was as per the daughters also has been having some hallucinations.  Patient has chronic cough.  Patient did not complain of any chest pain nausea vomiting or diarrhea.  No new medications have been started.  Yesterday when the patient came to the ER initially there was concern atrial fibrillation but on reviewing the EKG by the ER physician findings were secondary to artifact.  Interim history Admitted for acute encephalopathy likely secondary to pneumonia, influenza, UTI.  PT examined patient recommending SNF.  Assessment & Plan   Acute metabolic encephalopathy -likely secondary to CAP, influenza, UTI -CT head: no acute intracranial abnormality  -MRI brain: no acute intracranial abnormality  -Resolved, currently alert and oriented x3 -blood cultures negative to date -urine culture >100K Ecoli -treat underlying conditions  Generalized weakness with falls -PT/OT consulted, recommended SNF and 3in1 bedside commode, wheelchair -SW consulted for SNF placement -Discussed with patient and daughter at bedside on 3/23.  Daughter states patient cannot return home and current condition and agrees to SNF placement.  Influenza -Influenza B PCR positive -continue tamiflu  Community acquired pneumonia -CXR showed  Left basilar airspace disease -placed on levaquin- transitioned to azithromycin and ceftraixone -strep pneumonia and legionella urine antigens negative  -ordered O2 saturations to be monitored with and without oxygen -patient does not use oxygen at home  Urinary tract infection -UA: rare bacteria, 0-5 WBC, negative nitrites/leukocytes  -urine culture >100K Ecoli -continue ceftriaxone   Diabetes mellitus, type II -home medications held -CBG stable  -Continue ISS and CBG monitoring  Hyperlipidemia  -Continue statin, fenofibrate  Gout -stable   Essential hypertension -Not on home medications -BP stable, if needed will add on medications  Hypokalemia -K 3.5 -will give additional dose of K (goal of 4) -monitor BMP  Hypomagnesemia -Will give additional IV supplementation and monitor   DVT Prophylaxis  lovenox  Code Status: Full  Family Communication: None at bedside  Disposition Plan: Admitted. Pending further improvement and SNF placement  Consultants None  Procedures  None  Antibiotics   Anti-infectives (From admission, onward)   Start     Dose/Rate Route Frequency Ordered Stop   04/25/17 2200  oseltamivir (TAMIFLU) capsule 30 mg     30 mg Oral 2 times daily 04/25/17 1116 04/29/17 0959   04/25/17 2200  cefTRIAXone (ROCEPHIN) 1 g in sodium chloride 0.9 % 100 mL IVPB     1 g 200 mL/hr over 30 Minutes Intravenous Every 24 hours 04/25/17 1443     04/25/17 2200  azithromycin (ZITHROMAX) 500 mg in sodium chloride 0.9 % 250 mL IVPB     500 mg 250 mL/hr over 60 Minutes Intravenous Every 24 hours 04/25/17 1443     04/24/17 0915  oseltamivir (TAMIFLU) capsule 75 mg  Status:  Discontinued  75 mg Oral 2 times daily 04/24/17 0857 04/25/17 1116   04/24/17 0100  levofloxacin (LEVAQUIN) IVPB 750 mg  Status:  Discontinued     750 mg 100 mL/hr over 90 Minutes Intravenous Daily at bedtime 04/24/17 0036 04/25/17 1443   04/23/17 2315  cefTRIAXone (ROCEPHIN) 1 g in sodium  chloride 0.9 % 100 mL IVPB     1 g 200 mL/hr over 30 Minutes Intravenous  Once 04/23/17 2300 04/24/17 0041   04/23/17 2315  azithromycin (ZITHROMAX) 500 mg in sodium chloride 0.9 % 250 mL IVPB  Status:  Discontinued     500 mg 250 mL/hr over 60 Minutes Intravenous  Once 04/23/17 2300 04/24/17 0039      Subjective:   Melissa Dalton seen and examined today.  Complains of pain everywhere and feeling weak. Continues to have cough. Was not able to sleep well last night. Denies current chest pain, abdominal pain, nausea, vomiting, diarrhea, constipation, diarrhea, constipation.   Objective:   Vitals:   04/26/17 1330 04/26/17 1347 04/26/17 2210 04/27/17 0650  BP:  (!) 126/56 (!) 121/55 (!) 141/71  Pulse:  (!) 104 84 99  Resp:  (!) 22 18 18   Temp:  98.2 F (36.8 C) 98.2 F (36.8 C) (!) 97.4 F (36.3 C)  TempSrc:  Oral    SpO2: 94% 99% 95% 95%  Weight:      Height:        Intake/Output Summary (Last 24 hours) at 04/27/2017 1055 Last data filed at 04/27/2017 0505 Gross per 24 hour  Intake 240 ml  Output 800 ml  Net -560 ml   Filed Weights   04/24/17 0408  Weight: 93.7 kg (206 lb 8 oz)   Exam  General: Well developed, well nourished, NAD, appears stated age  38: NCAT,mucous membranes moist.   Neck: Supple  Cardiovascular: S1 S2 auscultated, RRR, no murmur  Respiratory: Diminished, with occ cough  Abdomen: Soft, nontender, nondistended, + bowel sounds  Extremities: warm dry without cyanosis clubbing or edema  Neuro: AAOx3, nonfocal  Skin: Without rashes exudates or nodules  Psych: Appropriate mood and affect   Data Reviewed: I have personally reviewed following labs and imaging studies  CBC: Recent Labs  Lab 04/22/17 1000  04/23/17 1621  04/24/17 0034 04/24/17 0710 04/25/17 0415 04/26/17 0518 04/27/17 0507  WBC 5.4  --  7.0  --  5.8 5.0 5.2 5.2 5.1  NEUTROABS 4.1  --  4.5  --   --   --   --   --   --   HGB 12.5   < > 12.7   < > 12.5 11.3* 11.1* 11.2*  11.0*  HCT 37.5   < > 38.4   < > 38.1 34.4* 34.4* 34.0* 33.8*  MCV 101.6*  --  100.0  --  100.5* 100.3* 99.7 101.2* 100.9*  PLT 149*  --  154  --  154 147* 171 189 204   < > = values in this interval not displayed.   Basic Metabolic Panel: Recent Labs  Lab 04/22/17 1000  04/23/17 1631 04/24/17 0034 04/24/17 0710 04/24/17 1200 04/25/17 0415 04/26/17 0518 04/27/17 0507  NA 140   < > 140  --  138  --  138 138 139  K 3.2*   < > 3.0*  --  2.9*  --  3.1* 3.3* 3.5  CL 105   < > 99*  --  102  --  100* 100* 101  CO2 25  --   --   --  25  --  27 26 26   GLUCOSE 95   < > 67  --  106*  --  89 104* 119*  BUN 19   < > 15  --  15  --  15 9 6   CREATININE 0.75   < > 0.70 0.74 0.77  --  0.65 0.61 0.70  CALCIUM 8.5*  --   --   --  8.0*  --  7.9* 7.9* 8.3*  MG  --   --   --   --   --  1.3*  --  1.6* 1.5*   < > = values in this interval not displayed.   GFR: Estimated Creatinine Clearance: 59.8 mL/min (by C-G formula based on SCr of 0.7 mg/dL). Liver Function Tests: Recent Labs  Lab 04/22/17 1000 04/24/17 0710  AST 35 26  ALT 16 14  ALKPHOS 45 38  BILITOT 0.5 0.6  PROT 6.1* 5.6*  ALBUMIN 3.0* 2.6*   No results for input(s): LIPASE, AMYLASE in the last 168 hours. Recent Labs  Lab 04/24/17 0053  AMMONIA 26   Coagulation Profile: No results for input(s): INR, PROTIME in the last 168 hours. Cardiac Enzymes: Recent Labs  Lab 04/24/17 0034 04/24/17 0710 04/24/17 1200  TROPONINI <0.03 <0.03 <0.03   BNP (last 3 results) No results for input(s): PROBNP in the last 8760 hours. HbA1C: No results for input(s): HGBA1C in the last 72 hours. CBG: Recent Labs  Lab 04/26/17 0800 04/26/17 1248 04/26/17 1625 04/26/17 2212 04/27/17 0801  GLUCAP 108* 129* 117* 119* 106*   Lipid Profile: No results for input(s): CHOL, HDL, LDLCALC, TRIG, CHOLHDL, LDLDIRECT in the last 72 hours. Thyroid Function Tests: No results for input(s): TSH, T4TOTAL, FREET4, T3FREE, THYROIDAB in the last 72  hours. Anemia Panel: No results for input(s): VITAMINB12, FOLATE, FERRITIN, TIBC, IRON, RETICCTPCT in the last 72 hours. Urine analysis:    Component Value Date/Time   COLORURINE STRAW (A) 04/23/2017 1337   APPEARANCEUR CLEAR 04/23/2017 1337   LABSPEC 1.004 (L) 04/23/2017 1337   PHURINE 5.0 04/23/2017 1337   GLUCOSEU NEGATIVE 04/23/2017 1337   HGBUR SMALL (A) 04/23/2017 1337   BILIRUBINUR NEGATIVE 04/23/2017 1337   KETONESUR NEGATIVE 04/23/2017 1337   PROTEINUR NEGATIVE 04/23/2017 1337   NITRITE NEGATIVE 04/23/2017 1337   LEUKOCYTESUR NEGATIVE 04/23/2017 1337   Sepsis Labs: @LABRCNTIP (procalcitonin:4,lacticidven:4)  ) Recent Results (from the past 240 hour(s))  Urine culture     Status: Abnormal   Collection Time: 04/23/17  1:37 PM  Result Value Ref Range Status   Specimen Description URINE, CLEAN CATCH  Final   Special Requests   Final    NONE Performed at [facility], [place] [address], [place], [place] [address]    Culture >=100,000 COLONIES/mL ESCHERICHIA COLI (A)  Final   Report Status 04/25/2017 FINAL  Final   Organism ID, Bacteria ESCHERICHIA COLI (A)  Final      Susceptibility   Escherichia coli - MIC*    AMPICILLIN >=32 RESISTANT Resistant     CEFAZOLIN <=4 SENSITIVE Sensitive     CEFTRIAXONE <=1 SENSITIVE Sensitive     CIPROFLOXACIN <=0.25 SENSITIVE Sensitive     GENTAMICIN <=1 SENSITIVE Sensitive     IMIPENEM <=0.25 SENSITIVE Sensitive     NITROFURANTOIN 64 INTERMEDIATE Intermediate     TRIMETH/SULFA <=20 SENSITIVE Sensitive     AMPICILLIN/SULBACTAM >=32 RESISTANT Resistant     PIP/TAZO <=4 SENSITIVE Sensitive     Extended ESBL NEGATIVE Sensitive     * >=  100,000 COLONIES/mL ESCHERICHIA COLI  Culture, blood (routine x 2) Call MD if unable to obtain prior to antibiotics being given     Status: None (Preliminary result)   Collection Time: 04/24/17 12:54 AM  Result Value Ref Range Status   Specimen Description BLOOD RIGHT FOREARM  Final   Special  Requests   Final    BOTTLES DRAWN AEROBIC AND ANAEROBIC Blood Culture adequate volume   Culture   Final    NO GROWTH 2 DAYS Performed at [facility], [address], [place], [place] [address]    Report Status PENDING  Incomplete  Culture, blood (routine x 2) Call MD if unable to obtain prior to antibiotics being given     Status: None (Preliminary result)   Collection Time: 04/24/17  1:05 AM  Result Value Ref Range Status   Specimen Description BLOOD LEFT HAND  Final   Special Requests   Final    BOTTLES DRAWN AEROBIC AND ANAEROBIC Blood Culture adequate volume   Culture   Final    NO GROWTH 2 DAYS Performed at [facility], [place] [address] [place], [place] [address]    Report Status PENDING  Incomplete      Radiology Studies: No results found.   Scheduled Meds: . allopurinol  300 mg Oral BID  . aspirin  650 mg Oral Daily  . enoxaparin (LOVENOX) injection  40 mg Subcutaneous Daily  . famotidine  20 mg Oral QHS  . fenofibrate  160 mg Oral Daily  . insulin aspart  0-9 Units Subcutaneous TID WC  . mouth rinse  15 mL Mouth Rinse BID  . niacin  500 mg Oral Daily  . oseltamivir  30 mg Oral BID  . pantoprazole  40 mg Oral Daily  . pravastatin  40 mg Oral Daily   Continuous Infusions: . azithromycin Stopped (04/26/17 2216)  . cefTRIAXone (ROCEPHIN)  IV Stopped (04/26/17 2146)     LOS: 3 days   Time Spent in minutes   30 minutes  Rabia Argote D.O. on 04/27/2017 at 10:55 AM  Between 7am to 7pm - Pager - [phone]  After 7pm go to [email or web address] - password [email or web address]  And look for the night coverage person covering for me after hours  [facility] Office  [phone]

## 2017-04-28 DIAGNOSIS — G9340 Encephalopathy, unspecified: Secondary | ICD-10-CM

## 2017-04-28 DIAGNOSIS — J189 Pneumonia, unspecified organism: Secondary | ICD-10-CM

## 2017-04-28 DIAGNOSIS — N390 Urinary tract infection, site not specified: Secondary | ICD-10-CM

## 2017-04-28 DIAGNOSIS — J111 Influenza due to unidentified influenza virus with other respiratory manifestations: Secondary | ICD-10-CM

## 2017-04-28 LAB — BASIC METABOLIC PANEL
Anion gap: 10 (ref 5–15)
CHLORIDE: 101 mmol/L (ref 101–111)
CO2: 27 mmol/L (ref 22–32)
CREATININE: 0.69 mg/dL (ref 0.44–1.00)
Calcium: 8.5 mg/dL — ABNORMAL LOW (ref 8.9–10.3)
GFR calc Af Amer: >60 mL/min (ref >60)
GFR calc non Af Amer: >60 mL/min (ref >60)
Glucose, Bld: 137 mg/dL — ABNORMAL HIGH (ref 65–99)
Potassium: 3.4 mmol/L — ABNORMAL LOW (ref 3.5–5.1)
SODIUM: 138 mmol/L (ref 135–145)

## 2017-04-28 LAB — CBC
HCT: 34.1 % — ABNORMAL LOW (ref 36.0–46.0)
HEMOGLOBIN: 11.3 g/dL — AB (ref 12.0–15.0)
MCH: 32.8 pg (ref 26.0–34.0)
MCHC: 33.1 g/dL (ref 30.0–36.0)
MCV: 98.8 fL (ref 78.0–100.0)
Platelets: 216 10*3/uL (ref 150–400)
RBC: 3.45 MIL/uL — ABNORMAL LOW (ref 3.87–5.11)
RDW: 13.9 % (ref 11.5–15.5)
WBC: 4.9 10*3/uL (ref 4.0–10.5)

## 2017-04-28 LAB — MAGNESIUM: MAGNESIUM: 1.6 mg/dL — AB (ref 1.7–2.4)

## 2017-04-28 LAB — GLUCOSE, CAPILLARY
GLUCOSE-CAPILLARY: 126 mg/dL — AB (ref 65–99)
GLUCOSE-CAPILLARY: 137 mg/dL — AB (ref 65–99)
GLUCOSE-CAPILLARY: 135 mg/dL — AB (ref 65–99)
Glucose-Capillary: 124 mg/dL — ABNORMAL HIGH (ref 65–99)

## 2017-04-28 MED ORDER — MAGNESIUM SULFATE 2 GM/50ML IV SOLN
2.0000 g | Freq: Once | INTRAVENOUS | Status: AC
  Administered 2017-04-28: 2 g via INTRAVENOUS
  Filled 2017-04-28: qty 50

## 2017-04-28 MED ORDER — POTASSIUM CHLORIDE CRYS ER 20 MEQ PO TBCR
40.0000 meq | EXTENDED_RELEASE_TABLET | Freq: Once | ORAL | Status: AC
  Administered 2017-04-28: 40 meq via ORAL
  Filled 2017-04-28: qty 2

## 2017-04-28 NOTE — Progress Notes (Signed)
Per [facility], patient will be authorized for SNF. Patient's daughter would like [facility].   Melissa Locus [name] LCSW [phone]

## 2017-04-28 NOTE — Progress Notes (Signed)
Physical Therapy Treatment Patient Details Name: Melissa Dalton MRN: [id number] DOB: 10-Mar-1934 Today's Date: 04/28/2017    History of Present Illness 83yo female having persistent weakness and confusion, also with history of trip to ED one day earlier due to cough, fever, weakness. She was found to be covered in feces at home by home health, and family reports history of hallucinations. CT of head negative for acute changes. Diagnosed with acute encephalopathy of unknown origin, possible pneumonia. PMH OA, DM, HTN, hernia repair     PT Comments    Patient is making gradual progress toward mobility goals. Pt required +2 assist for functional transfers and able to take a few steps EOB to recliner this session. Continue to progress as tolerated with anticipated d/c to SNF for further skilled PT services.     Follow Up Recommendations  SNF     Equipment Recommendations  Other (comment)(defer to next venue )    Recommendations for Other Services       Precautions / Restrictions Precautions Precautions: Fall    Mobility  Bed Mobility Overal bed mobility: Needs Assistance Bed Mobility: Supine to Sit     Supine to sit: Mod assist     General bed mobility comments: assist to bring hips to EOB with use of bed pad and to elevate trunk into sitting; use of rail and HOB elevated; cues for sequencing  Transfers Overall transfer level: Needs assistance Equipment used: Rolling walker (2 wheeled) Transfers: Stand Pivot Transfers;Squat Pivot Transfers   Stand pivot transfers: Mod assist;From elevated surface;+2 physical assistance Squat pivot transfers: Mod assist;+2 safety/equipment     General transfer comment: cues for safe  hand placement and technique; sit to stands X2; +2 assist to power up into standing and for balance and managing RW  Ambulation/Gait             General Gait Details: pt was able to take a few steps from EOB to recliner   Stairs             Wheelchair Mobility    Modified Rankin (Stroke Patients Only)       Balance Overall balance assessment: Needs assistance Sitting-balance support: Bilateral upper extremity supported;Feet supported Sitting balance-Leahy Scale: Fair     Standing balance support: Bilateral upper extremity supported;During functional activity Standing balance-Leahy Scale: Poor                              Cognition Arousal/Alertness: Awake/alert Behavior During Therapy: Anxious Overall Cognitive Status: Within Functional Limits for tasks assessed                                        Exercises      General Comments        Pertinent Vitals/Pain Pain Assessment: Faces Faces Pain Scale: Hurts little more Pain Location: bilat LE Pain Descriptors / Indicators: Sore Pain Intervention(s): Monitored during session;Repositioned    Home Living                      Prior Function            PT Goals (current goals can now be found in the care plan section) Acute Rehab PT Goals PT Goal Formulation: With patient/family Time For Goal Achievement: 05/09/17 Potential to Achieve Goals: Fair Progress towards PT goals: Progressing  toward goals    Frequency    Min 2X/week      PT Plan Current plan remains appropriate    Co-evaluation              AM-PAC PT "6 Clicks" Daily Activity  Outcome Measure  Difficulty turning over in bed (including adjusting bedclothes, sheets and blankets)?: Unable Difficulty moving from lying on back to sitting on the side of the bed? : Unable Difficulty sitting down on and standing up from a chair with arms (e.g., wheelchair, bedside commode, etc,.)?: Unable Help needed moving to and from a bed to chair (including a wheelchair)?: A Lot Help needed walking in hospital room?: Total Help needed climbing 3-5 steps with a railing? : Total 6 Click Score: 7    End of Session Equipment Utilized During Treatment:  Gait belt Activity Tolerance: Patient limited by fatigue Patient left: with call bell/phone within reach;in chair Nurse Communication: Mobility status PT Visit Diagnosis: Unsteadiness on feet (R26.81);Muscle weakness (generalized) (M62.81);History of falling (Z91.81);Adult, failure to thrive (R62.7);Difficulty in walking, not elsewhere classified (R26.2)     Time: 5102-5852 PT Time Calculation (min) (ACUTE ONLY): 20 min  Charges:  $Gait Training: 8-22 mins                    G Codes:       Earney Navy, PTA Pager: [phone]     Darliss Cheney 04/28/2017, 2:48 PM

## 2017-04-28 NOTE — Progress Notes (Signed)
[employer] provided auth for [facility]: [phone] for 8 days.   Per [facility], their expected discharge is now not happening until later tonight and they don't have time to clean the room for pt to arrive. They are able to accept patient in the morning. Pt's daughters aware.   Percell Locus Malessa Zartman LCSW [phone]

## 2017-04-28 NOTE — Discharge Summary (Signed)
Physician Discharge Summary  Melissa Dalton [facility] ONG:[id number] DOB: March 20, 1934 DOA: 04/23/2017  PCP: Chesley Noon, MD  Admit date: 04/23/2017 Discharge date: 04/28/2017  Admitted From: Home Disposition:  SNF  Recommendations for Outpatient Follow-up:  1. Follow up with nursing home provider at earliest convenience 2. Fall precautions   Home Health: No  Equipment/Devices: None  Discharge Condition: Stable CODE STATUS: Full  Diet recommendation: Heart Healthy / Carb Modified   Brief/Interim Summary: 83 year old female with history of diabetes mellitus, hypertension, gout, hyperlipidemia presented on 04/23/2017 with persistent weakness and confusion along with fever and cough.  She was admitted with acute encephalopathy likely secondary to pneumonia, influenza, UTI and started on antibiotics and Tamiflu.  MRI of the brain was negative for acute intracranial abnormality.  Urine culture grew E. coli.  Patient has completed a course of antibiotic treatment.  Currently hemodynamically stable.  PT recommended nursing home.  Discharge to nursing home once bed is available.  Discharge Diagnoses:  Principal Problem:   Acute encephalopathy Active Problems:   Weakness   Diabetes mellitus type 2 in obese (HCC)   CAP (community acquired pneumonia)   HLD (hyperlipidemia)   Gout  Acute metabolic encephalopathy -likely secondary to CAP, influenza, UTI -CT head: no acute intracranial abnormality  -MRI brain: no acute intracranial abnormality  -Mental status improved.  Generalized weakness with falls -PT/OT recommends SNF placement.  Continue PT/OT in SNF  Influenza -Influenza B PCR positive -Patient will complete 5-day course of Tamiflu today  Community acquired pneumonia -CXR showed Left basilar airspace disease -placed on levaquin- transitioned to azithromycin and ceftraixone -strep pneumonia and legionella urine antigens negative  -Patient is currently on Keflex and Zithromax.   We will not need anymore antibiotic treatment on discharge  Urinary tract infection -UA: rare bacteria, 0-5 WBC, negative nitrites/leukocytes  -urine culture >100K Ecoli -Initially on Rocephin, switched to Keflex.  No need for antibiotic on discharge.  Diabetes mellitus, type II -Resume home meds.  Outpatient follow-up  Hyperlipidemia  -Continue statin, fenofibrate  Gout -stable   Essential hypertension -Not on home medications -BP stable, if needed will add on medications.  Outpatient follow-up  Hypokalemia - Replaced.  Outpatient follow-up  Hypomagnesemia -Will replace. Outpatient followup    Discharge Instructions  Discharge Instructions    Call MD for:  difficulty breathing, headache or visual disturbances   Complete by:  As directed    Call MD for:  extreme fatigue   Complete by:  As directed    Call MD for:  hives   Complete by:  As directed    Call MD for:  persistant dizziness or light-headedness   Complete by:  As directed    Call MD for:  persistant nausea and vomiting   Complete by:  As directed    Call MD for:  severe uncontrolled pain   Complete by:  As directed    Call MD for:  temperature >100.4   Complete by:  As directed    Diet - low sodium heart healthy   Complete by:  As directed    Diet Carb Modified   Complete by:  As directed    Discharge instructions   Complete by:  As directed    Fall precautions   Increase activity slowly   Complete by:  As directed      Allergies as of 04/28/2017      Reactions   Oxycodone Nausea And Vomiting   Penicillins Hives   Tramadol Nausea And Vomiting  Medication List    STOP taking these medications   valsartan 80 MG tablet Commonly known as:  DIOVAN     TAKE these medications   allopurinol 300 MG tablet Commonly known as:  ZYLOPRIM Take 300 mg by mouth 2 (two) times daily. TAKE ONE TABLET BY MOUTH ONCE DAILY   aspirin 325 MG tablet Take 650 mg by mouth daily. Take 325 mg by mouth  2 (two) times daily.   benzonatate 100 MG capsule Commonly known as:  TESSALON Take 1 capsule (100 mg total) by mouth every 8 (eight) hours.   fenofibrate 160 MG tablet Take 160 mg by mouth daily. TAKE ONE TABLET BY MOUTH EVERY DAY   fexofenadine 180 MG tablet Commonly known as:  ALLEGRA 180 mg. Take 180 mg by mouth daily.   gabapentin 300 MG capsule Commonly known as:  NEURONTIN Take 300 mg by mouth 2 (two) times daily.   glipiZIDE 10 MG tablet Commonly known as:  GLUCOTROL Take 10 mg by mouth daily before breakfast. TAKE ONE TABLET BY MOUTH TWICE DAILY BEFORE A MEAL   KLOR-CON M20 20 MEQ tablet Generic drug:  potassium chloride SA Take 20 mEq by mouth once. TAKE ONE TABLET BY MOUTH EVERY DAY.   metFORMIN 1000 MG tablet Commonly known as:  GLUCOPHAGE Take 1,000 mg by mouth 2 (two) times daily.   NIASPAN 500 MG CR tablet Generic drug:  niacin Take 500 mg by mouth daily.   pantoprazole 40 MG tablet Commonly known as:  PROTONIX TAKE ONE TABLET BY MOUTH ONCE DAILY What changed:    how much to take  how to take this  when to take this   pravastatin 40 MG tablet Commonly known as:  PRAVACHOL Take 40 mg by mouth daily. TAKE ONE TABLET BY MOUTH EVERY DAY AS DIRECTED   ranitidine 300 MG capsule Commonly known as:  ZANTAC Take 1 capsule (300 mg total) by mouth every evening.   Vitamin D 1000 units capsule 1,000 mg. Take 1,000 mg by mouth.      Follow-up Information    Chesley Noon, MD.   Specialty:  Family Medicine Why:  As needed Contact information: [address] [place] [address] [phone]          Allergies  Allergen Reactions  . Oxycodone Nausea And Vomiting  . Penicillins Hives  . Tramadol Nausea And Vomiting    Consultations:  None   Procedures/Studies: Dg Chest 2 View  Result Date: 04/23/2017 CLINICAL DATA:  Shortness of breath and cough for 2 weeks. EXAM: CHEST - 2 VIEW COMPARISON:  PA and lateral chest 04/22/2017.   CT chest 10/07/2013. FINDINGS: The patient's left basilar airspace disease. The right lung is clear. Heart size is upper normal. No pneumothorax or pleural fluid. No acute bony abnormality. IMPRESSION: Left basilar airspace disease worrisome for pneumonia. Electronically Signed   By: Inge Rise M.D.   On: 04/23/2017 18:21   Dg Chest 2 View  Result Date: 04/22/2017 CLINICAL DATA:  Productive cough over the last 2 weeks.  Fever. EXAM: CHEST - 2 VIEW COMPARISON:  No previous chest radiography. Previous chest CT 10/07/2013 FINDINGS: Heart size is normal. The aorta shows unfolding and calcification. The patient has multiple bilateral pulmonary nodules under a cm in size, more numerous on the right than the left, unchanged when compared to previous CT studies. No sign of infiltrate, effusion or active collapse. No significant bone finding. IMPRESSION: No active disease by radiography. Multiple pulmonary nodules as  seen previously, presumed benign at this point. Electronically Signed   By: Nelson Chimes M.D.   On: 04/22/2017 10:24   Dg Pelvis 1-2 Views  Result Date: 04/23/2017 CLINICAL DATA:  Left hip pain for 9 months after falling EXAM: PELVIS - 1-2 VIEW COMPARISON:  Right hip radiograph 06/04/2006 FINDINGS: There is moderate right and severe left hip joint space narrowing. There is mixed subcortical sclerosis and lucency of the left femoral head. No acute fracture or dislocation. IMPRESSION: Moderate right and severe left hip osteoarthrosis. Electronically Signed   By: Ulyses Jarred M.D.   On: 04/23/2017 18:21   Ct Head Wo Contrast  Result Date: 04/23/2017 CLINICAL DATA:  Headache. EXAM: CT HEAD WITHOUT CONTRAST TECHNIQUE: Contiguous axial images were obtained from the base of the skull through the vertex without intravenous contrast. COMPARISON:  None. FINDINGS: Brain: No evidence of acute infarction, hemorrhage, hydrocephalus, extra-axial collection or mass lesion/mass effect. Mild cerebral atrophy,  age appropriate. Mild periventricular and subcortical white matter hypodensities are nonspecific but favored to reflect chronic microvascular ischemic changes. Vascular: Atherosclerotic vascular calcification of the carotid siphons. No hyperdense vessel. Skull: Negative for fracture or focal lesion. Sinuses/Orbits: Small air-fluid levels in the left greater than right maxillary sinuses and ethmoid air cells. The mastoid air cells are clear. No acute orbital abnormality. Other: None. IMPRESSION: 1.  No acute intracranial abnormality. 2. Small air-fluid levels within the bilateral maxillary sinuses and ethmoid air cells. Correlate for acute sinusitis. Electronically Signed   By: Titus Dubin M.D.   On: 04/23/2017 17:13   Mr Brain Wo Contrast  Result Date: 04/24/2017 CLINICAL DATA:  Initial evaluation for generalized weakness, altered mental status. EXAM: MRI HEAD WITHOUT CONTRAST TECHNIQUE: Multiplanar, multiecho pulse sequences of the brain and surrounding structures were obtained without intravenous contrast. COMPARISON:  Prior CT from 04/23/2017. FINDINGS: Brain: Diffuse prominence of the CSF containing spaces compatible with generalized age-related cerebral atrophy. Mild patchy T2/FLAIR hyperintensity within the periventricular, deep, and subcortical white matter both cerebral hemispheres most like related chronic small vessel ischemic disease, mild for age. No abnormal foci of restricted diffusion to suggest acute or subacute ischemia. Gray-white matter differentiation maintained. No evidence for acute or chronic intracranial hemorrhage. No remote cortical infarction of 5. No mass lesion, midline shift or mass effect. Mild ventricular prominence related global parenchymal volume loss of hydrocephalus. No extra-axial fluid collection. Major dural sinuses are grossly patent. Pituitary gland and suprasellar region normal. Midline structures intact and normal. Vascular: Major intracranial vascular flow voids  are well maintained. Skull and upper cervical spine: Craniocervical junction within normal limits. Upper cervical spine normal. Bone marrow signal intensity within normal limits. No scalp soft tissue abnormality. Sinuses/Orbits: Globes and orbital soft tissues within normal limits. Patient status post lens extraction bilaterally. Scattered mucosal thickening throughout the paranasal sinuses. Small air-fluid levels noted within the maxillary sinuses, ethmoidal air cells, and sphenoid sinuses, suggesting possible acute sinusitis. No mastoid effusion. Inner ear structures normal. Other: None. IMPRESSION: 1. No acute intracranial abnormality identified. 2. Generalized age-related cerebral atrophy with mild chronic small vessel ischemic disease. 3. Scattered air-fluid levels within the paranasal sinuses, suggesting acute sinusitis. Electronically Signed   By: Jeannine Boga M.D.   On: 04/24/2017 04:43    Subjective: Patient seen and examined at bedside.  She is sleeping, wakes up slightly, does not answer much questions.  No overnight fever or vomiting reported.  Discharge Exam: Vitals:   04/27/17 2117 04/28/17 0446  BP: (!) 141/72 (!) 154/86  Pulse: Marland Kitchen)  102 89  Resp: 17 17  Temp: (!) 97.4 F (36.3 C) (!) 97.4 F (36.3 C)  SpO2: 98% 95%   Vitals:   04/27/17 1422 04/27/17 1423 04/27/17 2117 04/28/17 0446  BP:  (!) 143/64 (!) 141/72 (!) 154/86  Pulse: (!) 107 (!) 102 (!) 102 89  Resp: (!) 22  17 17   Temp: 97.6 F (36.4 C)  (!) 97.4 F (36.3 C) (!) 97.4 F (36.3 C)  TempSrc: Oral   Oral  SpO2: 95%  98% 95%  Weight:      Height:        General: Pt is sleepy, wakes up slightly but does not answer much questions  cardiovascular: Rate controlled S1/S2 + Respiratory: Bilateral decreased breath sounds at bases Abdominal: Soft, NT, ND, bowel sounds + Extremities: no edema, no cyanosis    The results of significant diagnostics from this hospitalization (including imaging, microbiology,  ancillary and laboratory) are listed below for reference.     Microbiology: Recent Results (from the past 240 hour(s))  Urine culture     Status: Abnormal   Collection Time: 04/23/17  1:37 PM  Result Value Ref Range Status   Specimen Description URINE, CLEAN CATCH  Final   Special Requests   Final    NONE Performed at [facility], [address] [place], [place] [address]    Culture >=100,000 COLONIES/mL ESCHERICHIA COLI (A)  Final   Report Status 04/25/2017 FINAL  Final   Organism ID, Bacteria ESCHERICHIA COLI (A)  Final      Susceptibility   Escherichia coli - MIC*    AMPICILLIN >=32 RESISTANT Resistant     CEFAZOLIN <=4 SENSITIVE Sensitive     CEFTRIAXONE <=1 SENSITIVE Sensitive     CIPROFLOXACIN <=0.25 SENSITIVE Sensitive     GENTAMICIN <=1 SENSITIVE Sensitive     IMIPENEM <=0.25 SENSITIVE Sensitive     NITROFURANTOIN 64 INTERMEDIATE Intermediate     TRIMETH/SULFA <=20 SENSITIVE Sensitive     AMPICILLIN/SULBACTAM >=32 RESISTANT Resistant     PIP/TAZO <=4 SENSITIVE Sensitive     Extended ESBL NEGATIVE Sensitive     * >=100,000 COLONIES/mL ESCHERICHIA COLI  Culture, blood (routine x 2) Call MD if unable to obtain prior to antibiotics being given     Status: None (Preliminary result)   Collection Time: 04/24/17 12:54 AM  Result Value Ref Range Status   Specimen Description BLOOD RIGHT FOREARM  Final   Special Requests   Final    BOTTLES DRAWN AEROBIC AND ANAEROBIC Blood Culture adequate volume   Culture   Final    NO GROWTH 3 DAYS Performed at [facility], [address], [place], [place] [address]    Report Status PENDING  Incomplete  Culture, blood (routine x 2) Call MD if unable to obtain prior to antibiotics being given     Status: None (Preliminary result)   Collection Time: 04/24/17  1:05 AM  Result Value Ref Range Status   Specimen Description BLOOD LEFT HAND  Final   Special Requests   Final    BOTTLES DRAWN AEROBIC AND ANAEROBIC Blood Culture  adequate volume   Culture   Final    NO GROWTH 3 DAYS Performed at [facility], [place] [address], [place], [place] [address]    Report Status PENDING  Incomplete     Labs: BNP (last 3 results) No results for input(s): BNP in the last 8760 hours. Basic Metabolic Panel: Recent Labs  Lab 04/24/17 0710  04/24/17 1200 04/25/17 0415 04/26/17 0518 04/27/17 0507 04/28/17 0710  NA 138  --  138 138 139 138  K 2.9*  --  3.1* 3.3* 3.5 3.4*  CL 102  --  100* 100* 101 101  CO2 25  --  27 26 26 27   GLUCOSE 106*  --  89 104* 119* 137*  BUN 15  --  15 9 6  <5*  CREATININE 0.77  --  0.65 0.61 0.70 0.69  CALCIUM 8.0*  --  7.9* 7.9* 8.3* 8.5*  MG  --  1.3*  --  1.6* 1.5* 1.6*   Liver Function Tests: Recent Labs  Lab 04/22/17 1000 04/24/17 0710  AST 35 26  ALT 16 14  ALKPHOS 45 38  BILITOT 0.5 0.6  PROT 6.1* 5.6*  ALBUMIN 3.0* 2.6*   No results for input(s): LIPASE, AMYLASE in the last 168 hours. Recent Labs  Lab 04/24/17 0053  AMMONIA 26   CBC: Recent Labs  Lab 04/22/17 1000  04/23/17 1621  04/24/17 0710 04/25/17 0415 04/26/17 0518 04/27/17 0507 04/28/17 0710  WBC 5.4  --  7.0   < > 5.0 5.2 5.2 5.1 4.9  NEUTROABS 4.1  --  4.5  --   --   --   --   --   --   HGB 12.5   < > 12.7   < > 11.3* 11.1* 11.2* 11.0* 11.3*  HCT 37.5   < > 38.4   < > 34.4* 34.4* 34.0* 33.8* 34.1*  MCV 101.6*  --  100.0   < > 100.3* 99.7 101.2* 100.9* 98.8  PLT 149*  --  154   < > 147* 171 189 204 216   < > = values in this interval not displayed.   Cardiac Enzymes: Recent Labs  Lab 04/24/17 0034 04/24/17 0710 04/24/17 1200  TROPONINI <0.03 <0.03 <0.03   BNP: Invalid input(s): POCBNP CBG: Recent Labs  Lab 04/27/17 0801 04/27/17 1225 04/27/17 1710 04/27/17 2117 04/28/17 0820  GLUCAP 106* 120* 139* 188* 124*   D-Dimer No results for input(s): DDIMER in the last 72 hours. Hgb A1c No results for input(s): HGBA1C in the last 72 hours. Lipid Profile No results for input(s): CHOL,  HDL, LDLCALC, TRIG, CHOLHDL, LDLDIRECT in the last 72 hours. Thyroid function studies No results for input(s): TSH, T4TOTAL, T3FREE, THYROIDAB in the last 72 hours.  Invalid input(s): FREET3 Anemia work up No results for input(s): VITAMINB12, FOLATE, FERRITIN, TIBC, IRON, RETICCTPCT in the last 72 hours. Urinalysis    Component Value Date/Time   COLORURINE STRAW (A) 04/23/2017 1337   APPEARANCEUR CLEAR 04/23/2017 1337   LABSPEC 1.004 (L) 04/23/2017 1337   PHURINE 5.0 04/23/2017 1337   GLUCOSEU NEGATIVE 04/23/2017 1337   HGBUR SMALL (A) 04/23/2017 1337   BILIRUBINUR NEGATIVE 04/23/2017 1337   KETONESUR NEGATIVE 04/23/2017 1337   PROTEINUR NEGATIVE 04/23/2017 1337   NITRITE NEGATIVE 04/23/2017 1337   LEUKOCYTESUR NEGATIVE 04/23/2017 1337   Sepsis Labs Invalid input(s): PROCALCITONIN,  WBC,  LACTICIDVEN Microbiology Recent Results (from the past 240 hour(s))  Urine culture     Status: Abnormal   Collection Time: 04/23/17  1:37 PM  Result Value Ref Range Status   Specimen Description URINE, CLEAN CATCH  Final   Special Requests   Final    NONE Performed at [facility], [place] [address] [place], [place] [address]    Culture >=100,000 COLONIES/mL ESCHERICHIA COLI (A)  Final   Report Status 04/25/2017 FINAL  Final  Organism ID, Bacteria ESCHERICHIA COLI (A)  Final      Susceptibility   Escherichia coli - MIC*    AMPICILLIN >=32 RESISTANT Resistant     CEFAZOLIN <=4 SENSITIVE Sensitive     CEFTRIAXONE <=1 SENSITIVE Sensitive     CIPROFLOXACIN <=0.25 SENSITIVE Sensitive     GENTAMICIN <=1 SENSITIVE Sensitive     IMIPENEM <=0.25 SENSITIVE Sensitive     NITROFURANTOIN 64 INTERMEDIATE Intermediate     TRIMETH/SULFA <=20 SENSITIVE Sensitive     AMPICILLIN/SULBACTAM >=32 RESISTANT Resistant     PIP/TAZO <=4 SENSITIVE Sensitive     Extended ESBL NEGATIVE Sensitive     * >=100,000 COLONIES/mL ESCHERICHIA COLI  Culture, blood (routine x 2) Call MD if unable to obtain prior to  antibiotics being given     Status: None (Preliminary result)   Collection Time: 04/24/17 12:54 AM  Result Value Ref Range Status   Specimen Description BLOOD RIGHT FOREARM  Final   Special Requests   Final    BOTTLES DRAWN AEROBIC AND ANAEROBIC Blood Culture adequate volume   Culture   Final    NO GROWTH 3 DAYS Performed at [facility], [address], [place], [place] [address]    Report Status PENDING  Incomplete  Culture, blood (routine x 2) Call MD if unable to obtain prior to antibiotics being given     Status: None (Preliminary result)   Collection Time: 04/24/17  1:05 AM  Result Value Ref Range Status   Specimen Description BLOOD LEFT HAND  Final   Special Requests   Final    BOTTLES DRAWN AEROBIC AND ANAEROBIC Blood Culture adequate volume   Culture   Final    NO GROWTH 3 DAYS Performed at [facility], [place] [address], [place], [place] [address]    Report Status PENDING  Incomplete     Time coordinating discharge: 68minutes  SIGNED:   Aline August, MD  [facility] 04/28/2017, 10:22 AM Pager: [phone]  If 7PM-7AM, please contact night-coverage [email or web address] Password [email or web address]

## 2017-04-28 NOTE — Progress Notes (Signed)
Per patient's insurance, in PT eval patient reports a fear of falling and will not get out of the bed. Insurance is going to send it to their Market researcher for review. They asked for PT to see her today to see if she can at least take a few steps. PT notified by CSW to see patient today.  Percell Locus Romar Woodrick LCSW [phone]

## 2017-04-29 DIAGNOSIS — J189 Pneumonia, unspecified organism: Secondary | ICD-10-CM | POA: Diagnosis not present

## 2017-04-29 DIAGNOSIS — E1140 Type 2 diabetes mellitus with diabetic neuropathy, unspecified: Secondary | ICD-10-CM | POA: Diagnosis not present

## 2017-04-29 DIAGNOSIS — E1169 Type 2 diabetes mellitus with other specified complication: Secondary | ICD-10-CM | POA: Diagnosis not present

## 2017-04-29 DIAGNOSIS — R278 Other lack of coordination: Secondary | ICD-10-CM | POA: Diagnosis not present

## 2017-04-29 DIAGNOSIS — I10 Essential (primary) hypertension: Secondary | ICD-10-CM | POA: Diagnosis not present

## 2017-04-29 DIAGNOSIS — R4182 Altered mental status, unspecified: Secondary | ICD-10-CM | POA: Diagnosis not present

## 2017-04-29 DIAGNOSIS — M6281 Muscle weakness (generalized): Secondary | ICD-10-CM | POA: Diagnosis not present

## 2017-04-29 DIAGNOSIS — Z7982 Long term (current) use of aspirin: Secondary | ICD-10-CM | POA: Diagnosis not present

## 2017-04-29 DIAGNOSIS — E669 Obesity, unspecified: Secondary | ICD-10-CM | POA: Diagnosis not present

## 2017-04-29 DIAGNOSIS — E785 Hyperlipidemia, unspecified: Secondary | ICD-10-CM | POA: Diagnosis not present

## 2017-04-29 DIAGNOSIS — Z9181 History of falling: Secondary | ICD-10-CM | POA: Diagnosis not present

## 2017-04-29 LAB — CULTURE, BLOOD (ROUTINE X 2)
Culture: NO GROWTH
Culture: NO GROWTH
SPECIAL REQUESTS: ADEQUATE
Special Requests: ADEQUATE

## 2017-04-29 LAB — GLUCOSE, CAPILLARY: GLUCOSE-CAPILLARY: 126 mg/dL — AB (ref 65–99)

## 2017-04-29 MED ORDER — DOCUSATE SODIUM 100 MG PO CAPS
100.0000 mg | ORAL_CAPSULE | Freq: Two times a day (BID) | ORAL | Status: DC
  Administered 2017-04-29: 100 mg via ORAL
  Filled 2017-04-29: qty 1

## 2017-04-29 NOTE — Progress Notes (Signed)
Occupational Therapy Treatment Patient Details Name: Melissa Dalton MRN: [id number] DOB: 10-14-1934 Today's Date: 04/29/2017    History of present illness 83yo female having persistent weakness and confusion, also with history of trip to ED one day earlier due to cough, fever, weakness. She was found to be covered in feces at home by home health, and family reports history of hallucinations. CT of head negative for acute changes. Diagnosed with acute encephalopathy of unknown origin, possible pneumonia. PMH OA, DM, HTN, hernia repair    OT comments  Pt progressing towards OT goals, presents supine in bed and requiring encouragement to participate in treatment session. Pt sat EOB >5 min while completing grooming ADLs with setup assist and minguard for static sitting balance; requires min-modA+2 for stand pivot transfer to recliner using RW. Pt continues to demonstrate generalized weakness and decreased activity tolerance. Feel SNF recommendation remains appropriate at this time. Will continue to follow acutely.   Follow Up Recommendations  SNF;Supervision/Assistance - 24 hour    Equipment Recommendations  3 in 1 bedside commode;Wheelchair (measurements OT);Wheelchair cushion (measurements OT)          Precautions / Restrictions Precautions Precautions: Fall Restrictions Weight Bearing Restrictions: No       Mobility Bed Mobility Overal bed mobility: Needs Assistance Bed Mobility: Supine to Sit     Supine to sit: Mod assist     General bed mobility comments: assist to bring hips to EOB with use of bed pad and to elevate trunk into sitting; use of rail and HOB elevated; cues for sequencing  Transfers Overall transfer level: Needs assistance Equipment used: Rolling walker (2 wheeled) Transfers: Sit to/from [facility] Sit to Stand: Mod assist;+2 physical assistance;+2 safety/equipment   Squat pivot transfers: Min assist;+2 physical assistance;+2  safety/equipment     General transfer comment: pt requires +2 to boost into standing from EOB and to steady at RW; minA+2 to take pivotal steps to recliner     Balance Overall balance assessment: Needs assistance Sitting-balance support: Bilateral upper extremity supported;Feet supported Sitting balance-Leahy Scale: Fair Sitting balance - Comments: pt completing grooming ADLs seated EOB with minguard for safety    Standing balance support: Bilateral upper extremity supported;During functional activity Standing balance-Leahy Scale: Poor                             ADL either performed or assessed with clinical judgement   ADL Overall ADL's : Needs assistance/impaired     Grooming: Brushing hair;Set up;Min guard;Sitting Grooming Details (indicate cue type and reason): sitting EOB              Lower Body Dressing: Total assistance;Bed level Lower Body Dressing Details (indicate cue type and reason): donning socks at bed level              Functional mobility during ADLs: Minimal assistance;+2 for physical assistance;+2 for safety/equipment;Rolling walker General ADL Comments: pt requires increased time/encouragement for participation in OOB activity; completes grooming ADLs seated EOB and stand pivot transfer to recliner during session                        Cognition Arousal/Alertness: Awake/alert Behavior During Therapy: Anxious Overall Cognitive Status: Within Functional Limits for tasks assessed  Pertinent Vitals/ Pain       Pain Assessment: Faces Faces Pain Scale: Hurts little more Pain Location: bilat LE Pain Descriptors / Indicators: Sore;Tender Pain Intervention(s): Monitored during session                                                          Frequency  Min 2X/week        Progress Toward Goals  OT Goals(current goals  can now be found in the care plan section)  Progress towards OT goals: Progressing toward goals  Acute Rehab OT Goals Patient Stated Goal: to feel better  OT Goal Formulation: With patient Time For Goal Achievement: 05/09/17 Potential to Achieve Goals: [place] Discharge plan remains appropriate                    AM-PAC PT "6 Clicks" Daily Activity     Outcome Measure   Help from another person eating meals?: None Help from another person taking care of personal grooming?: A Little Help from another person toileting, which includes using toliet, bedpan, or urinal?: A Lot Help from another person bathing (including washing, rinsing, drying)?: A Lot Help from another person to put on and taking off regular upper body clothing?: A Lot Help from another person to put on and taking off regular lower body clothing?: Total 6 Click Score: 14    End of Session Equipment Utilized During Treatment: Gait belt;Rolling walker  OT Visit Diagnosis: Muscle weakness (generalized) (M62.81);Pain   Activity Tolerance Patient tolerated treatment well   Patient Left in chair;with call bell/phone within reach;with chair alarm set;with family/visitor present   Nurse Communication Mobility status        Time: 1505-6979 OT Time Calculation (min): 27 min  Charges: OT General Charges $OT Visit: 1 Visit OT Treatments $Self Care/Home Management : 23-37 mins  Melissa [place], [place] Pager [phone] 04/29/2017    Melissa Dalton 04/29/2017, 11:07 AM

## 2017-04-29 NOTE — Progress Notes (Signed)
Rande Brunt [place] to be D/C'd to [facility] SNF per MD order. Report called to Caryl Asp, Therapist, sports.  VVS, Skin clean, dry and intact without evidence of skin break down, no evidence of skin tears noted.  IV catheter discontinued intact. Site without signs and symptoms of complications. Dressing and pressure applied.  An After Visit Summary was printed and given to the patient's daughter and PTAR.  Patient escorted via stretcher, and D/C to [facility] via PTAR.  Melonie [place]  04/29/2017 11:26 AM

## 2017-04-29 NOTE — Progress Notes (Signed)
Patient will DC to: [facility] Anticipated DC date: 04/29/17 Family notified: Daughter, Copy by: Corey Harold   Per MD patient ready for DC to [facility]. RN, patient, patient's family, and facility notified of DC. Discharge Summary sent to facility. RN given number for report [phone]). DC packet on chart. Ambulance transport requested for patient.   CSW signing off.  Cedric Fishman, LCSW Clinical Social Worker [phone] t

## 2017-04-29 NOTE — Clinical Social Work Placement (Signed)
   CLINICAL SOCIAL WORK PLACEMENT  NOTE  Date:  04/29/2017  Patient Details  Name: Melissa Dalton MRN: [id number] Date of Birth: November 08, 1934  Clinical Social Work is seeking post-discharge placement for this patient at the [place] level of care (*CSW will initial, date and re-position this form in  chart as items are completed):  Yes   Patient/family provided with [place] Work Department's list of facilities offering this level of care within the geographic area requested by the patient (or if unable, by the patient's family).  Yes   Patient/family informed of their freedom to choose among providers that offer the needed level of care, that participate in Medicare, Medicaid or managed care program needed by the patient, have an available bed and are willing to accept the patient.  Yes   Patient/family informed of [facility]'s ownership interest in [facility] and [facility], as well as of the fact that they are under no obligation to receive care at these facilities.  PASRR submitted to EDS on 04/25/17     PASRR number received on 04/25/17     Existing PASRR number confirmed on       FL2 transmitted to all facilities in geographic area requested by pt/family on 04/25/17     FL2 transmitted to all facilities within larger geographic area on       Patient informed that his/her managed care company has contracts with or will negotiate with certain facilities, including the following:        Yes   Patient/family informed of bed offers received.  Patient chooses bed at [facility]     Physician recommends and patient chooses bed at      Patient to be transferred to [facility] on 04/29/17.  Patient to be transferred to facility by PTAR     Patient family notified on 04/29/17 of transfer.  Name of family member notified:  [name], daughter     PHYSICIAN       Additional Comment:     _______________________________________________ Benard Halsted, [place] 04/29/2017, 8:54 AM

## 2017-04-29 NOTE — Progress Notes (Signed)
Patient ID: Melissa Dalton, female   DOB: 1934-12-07, 83 y.o.   MRN: [id number] Patient was supposed to be discharged to nursing home on 04/28/2017 but that did not happen.  Patient will probably be discharged to nursing home today.  Patient seen and examined at bedside.  Please refer to the full discharge summary done by me on 04/28/2017 for full details.  Patient is stable for discharge.

## 2017-05-18 ENCOUNTER — Emergency Department (HOSPITAL_COMMUNITY): Payer: PPO

## 2017-05-18 ENCOUNTER — Emergency Department (HOSPITAL_COMMUNITY)
Admission: EM | Admit: 2017-05-18 | Discharge: 2017-05-19 | Disposition: A | Discharge status: Home / Self Care | Payer: PPO | Attending: Emergency Medicine | Admitting: Emergency Medicine

## 2017-05-18 DIAGNOSIS — E119 Type 2 diabetes mellitus without complications: Secondary | ICD-10-CM | POA: Diagnosis not present

## 2017-05-18 DIAGNOSIS — R531 Weakness: Secondary | ICD-10-CM | POA: Insufficient documentation

## 2017-05-18 DIAGNOSIS — I10 Essential (primary) hypertension: Secondary | ICD-10-CM | POA: Insufficient documentation

## 2017-05-18 DIAGNOSIS — Z7984 Long term (current) use of oral hypoglycemic drugs: Secondary | ICD-10-CM | POA: Insufficient documentation

## 2017-05-18 DIAGNOSIS — Z7982 Long term (current) use of aspirin: Secondary | ICD-10-CM | POA: Insufficient documentation

## 2017-05-18 DIAGNOSIS — Z79899 Other long term (current) drug therapy: Secondary | ICD-10-CM | POA: Insufficient documentation

## 2017-05-18 DIAGNOSIS — R404 Transient alteration of awareness: Secondary | ICD-10-CM | POA: Diagnosis not present

## 2017-05-18 LAB — COMPREHENSIVE METABOLIC PANEL
ALK PHOS: 39 U/L (ref 38–126)
ALT: 12 U/L — ABNORMAL LOW (ref 14–54)
ANION GAP: 11 (ref 5–15)
AST: 21 U/L (ref 15–41)
Albumin: 3.1 g/dL — ABNORMAL LOW (ref 3.5–5.0)
BUN: 13 mg/dL (ref 6–20)
CALCIUM: 8.8 mg/dL — AB (ref 8.9–10.3)
CO2: 21 mmol/L — ABNORMAL LOW (ref 22–32)
Chloride: 105 mmol/L (ref 101–111)
Creatinine, Ser: 0.87 mg/dL (ref 0.44–1.00)
GFR calc non Af Amer: >60 mL/min (ref >60)
Glucose, Bld: 115 mg/dL — ABNORMAL HIGH (ref 65–99)
POTASSIUM: 3.5 mmol/L (ref 3.5–5.1)
SODIUM: 137 mmol/L (ref 135–145)
TOTAL PROTEIN: 6.1 g/dL — AB (ref 6.5–8.1)
Total Bilirubin: 0.6 mg/dL (ref 0.3–1.2)

## 2017-05-18 LAB — CBC WITH DIFFERENTIAL/PLATELET
Basophils Absolute: 0 10*3/uL (ref 0.0–0.1)
Basophils Relative: 0 %
EOS ABS: 0.2 10*3/uL (ref 0.0–0.7)
EOS PCT: 3 %
HCT: 34.2 % — ABNORMAL LOW (ref 36.0–46.0)
HEMOGLOBIN: 11.6 g/dL — AB (ref 12.0–15.0)
LYMPHS ABS: 2.2 10*3/uL (ref 0.7–4.0)
Lymphocytes Relative: 29 %
MCH: 33.4 pg (ref 26.0–34.0)
MCHC: 33.9 g/dL (ref 30.0–36.0)
MCV: 98.6 fL (ref 78.0–100.0)
MONO ABS: 0.6 10*3/uL (ref 0.1–1.0)
MONOS PCT: 7 %
NEUTROS PCT: 61 %
Neutro Abs: 4.7 10*3/uL (ref 1.7–7.7)
Platelets: 296 10*3/uL (ref 150–400)
RBC: 3.47 MIL/uL — ABNORMAL LOW (ref 3.87–5.11)
RDW: 14.2 % (ref 11.5–15.5)
WBC: 7.6 10*3/uL (ref 4.0–10.5)

## 2017-05-18 LAB — I-STAT TROPONIN, ED: TROPONIN I, POC: 0 ng/mL (ref 0.00–0.08)

## 2017-05-18 NOTE — ED Provider Notes (Addendum)
[facility] EMERGENCY DEPARTMENT Provider Note   CSN: [id number] Arrival date & time: 05/18/17  2241     History   Chief Complaint Chief Complaint  Patient presents with  . Gen. Weakness    Leg Pain     HPI Melissa Dalton is a 83 y.o. female.  Patient is an 83 year old female with past medical history of type 2 diabetes, hyperlipidemia, peripheral neuropathy, and recent admission for pneumonia/influenza/UTI.  She was in the hospital for 2 weeks, then discharged to a rehab facility.  She was released from the rehab facility today to home.  This evening she became weak and had difficulty ambulating.  She states that her legs did not "want to cooperate".  She was having difficulty getting up, so 911 was called and she was transported back here.  She denies any new injury or trauma.  She denies nausea, vomiting, or diarrhea.  She denies any chest pain or shortness of breath.  She denies any urinary complaints.  The history is provided by the patient.    Past Medical History:  Diagnosis Date  . Arthritis   . Diabetes ([place])   . High cholesterol   . HTN (hypertension)     Patient Active Problem List   Diagnosis Date Noted  . Acute encephalopathy 04/24/2017  . Weakness 04/24/2017  . Diabetes mellitus type 2 in obese ([place]) 04/24/2017  . CAP (community acquired pneumonia) 04/24/2017  . HLD (hyperlipidemia) 04/24/2017  . Gout 04/24/2017  . Coronary artery calcification 12/20/2012  . Multiple lung nodules 10/25/2012  . Chronic cough 10/25/2012    Past Surgical History:  Procedure Laterality Date  . HERNIA REPAIR    . TOTAL ABDOMINAL HYSTERECTOMY    . TUMOR REMOVAL       OB History   None      Home Medications    Prior to Admission medications   Medication Sig Start Date End Date Taking? Authorizing Provider  allopurinol (ZYLOPRIM) 300 MG tablet Take 300 mg by mouth 2 (two) times daily. TAKE ONE TABLET BY MOUTH ONCE DAILY 09/09/12   [provider]  aspirin 325 MG tablet Take 650 mg by mouth daily. Take 325 mg by mouth 2 (two) times daily.      [provider]  benzonatate (TESSALON) 100 MG capsule Take 1 capsule (100 mg total) by mouth every 8 (eight) hours. 04/22/17   Deno Etienne, DO  Cholecalciferol (VITAMIN D) 1000 UNITS capsule 1,000 mg. Take 1,000 mg by mouth.    [provider]  fenofibrate 160 MG tablet Take 160 mg by mouth daily. TAKE ONE TABLET BY MOUTH EVERY DAY 10/22/12   [provider]  fexofenadine (ALLEGRA) 180 MG tablet 180 mg. Take 180 mg by mouth daily.    [provider]  gabapentin (NEURONTIN) 300 MG capsule Take 300 mg by mouth 2 (two) times daily. 03/18/17   [provider]  glipiZIDE (GLUCOTROL) 10 MG tablet Take 10 mg by mouth daily before breakfast. TAKE ONE TABLET BY MOUTH TWICE DAILY BEFORE A MEAL 06/30/12   [provider]  metFORMIN (GLUCOPHAGE) 1000 MG tablet Take 1,000 mg by mouth 2 (two) times daily. 02/10/17   [provider]  niacin (NIASPAN) 500 MG CR tablet Take 500 mg by mouth daily.  01/31/11   [provider]  pantoprazole (PROTONIX) 40 MG tablet TAKE ONE TABLET BY MOUTH ONCE DAILY Patient taking differently: TAKE 40 mg TABLET BY MOUTH ONCE DAILY 10/20/13  Brand Males, MD  potassium chloride SA (KLOR-CON M20) 20 MEQ tablet Take 20 mEq by mouth once. TAKE ONE TABLET BY MOUTH EVERY DAY. 08/06/12   [provider]  pravastatin (PRAVACHOL) 40 MG tablet Take 40 mg by mouth daily. TAKE ONE TABLET BY MOUTH EVERY DAY AS DIRECTED 08/18/12   [provider]  ranitidine (ZANTAC) 300 MG capsule Take 1 capsule (300 mg total) by mouth every evening. 02/08/13   Brand Males, MD    Family History Family History  Problem Relation Age of Onset  . Heart disease Father   . Diabetes Mother     Social History Social History   Tobacco Use  . Smoking status: Never Smoker  . Smokeless tobacco: Current User    Substance Use Topics  . Alcohol use: No  . Drug use: No     Allergies   Oxycodone; Penicillins; and Tramadol   Review of Systems Review of Systems  All other systems reviewed and are negative.    Physical Exam Updated Vital Signs BP 132/79   Pulse 74   Temp 98.8 F (37.1 C) (Oral)   Resp (!) 23   Ht 5\' 2"  (1.575 m)   Wt 93.4 kg (206 lb)   SpO2 99%   BMI 37.68 kg/m   Physical Exam  Constitutional: She is oriented to person, place, and time. She appears well-developed and well-nourished. No distress.  HENT:  Head: Normocephalic and atraumatic.  Mouth/Throat: Oropharynx is clear and moist.  Eyes: Pupils are equal, round, and reactive to light. EOM are normal.  Neck: Normal range of motion. Neck supple.  Cardiovascular: Normal rate and regular rhythm. Exam reveals no gallop and no friction rub.  No murmur heard. Pulmonary/Chest: Effort normal and breath sounds normal. No respiratory distress. She has no wheezes.  Abdominal: Soft. Bowel sounds are normal. She exhibits no distension. There is no tenderness.  Musculoskeletal: Normal range of motion. She exhibits edema and deformity.  There is trace edema of both lower extremities.  Neurological: She is alert and oriented to person, place, and time. No cranial nerve deficit. She exhibits normal muscle tone. Coordination normal.  Skin: Skin is warm and dry. She is not diaphoretic.  Nursing note and vitals reviewed.    ED Treatments / Results  Labs (all labs ordered are listed, but only abnormal results are displayed) Labs Reviewed  CBC WITH DIFFERENTIAL/PLATELET  COMPREHENSIVE METABOLIC PANEL  URINALYSIS, ROUTINE W REFLEX MICROSCOPIC  BRAIN NATRIURETIC PEPTIDE  I-STAT TROPONIN, ED    EKG EKG Interpretation  Date/Time:  Sunday May 18 2017 22:51:27 EDT Ventricular Rate:  93 PR Interval:    QRS Duration: 142 QT Interval:  391 QTC Calculation: 487 R Axis:   -61 Text Interpretation:  Sinus rhythm RBBB and  LAFB No signficant change from 04/22/2017 Confirmed by Veryl Speak [phone]) on 05/18/2017 10:56:47 PM   Radiology No results found.  Procedures Procedures (including critical care time)  Medications Ordered in ED Medications - No data to display   Initial Impression / Assessment and Plan / ED Course  I have reviewed the triage vital signs and the nursing notes.  Pertinent labs & imaging results that were available during my care of the patient were reviewed by me and considered in my medical decision making (see chart for details).  Patient presents with weakness.  She was discharged just today from a rehab facility where she was recovering from pneumonia/UTI/flulike illness.  I am unable to find a definite medical reason for  her weakness.  It could be related to deconditioning from her prior hospitalization.  I attempted to ambulate the patient, however she was only to take a few steps before she had to stop and sit.    According to the family at bedside, they do not feel as though the patient is suitable to go home.  She lives with her husband who is also significantly debilitated.    I have discussed the case with Dr. Darrick Meigs from the hospitalist service.  We both agree that the patient has no clear medical reason for admission.  She will undergo consultation with case management in the morning.  She may require more time at a rehab facility before she can return home.  Addendum: Patient was seen by Case management and has informed them she would like to go home.  She will be discharged at her request.  Final Clinical Impressions(s) / ED Diagnoses   Final diagnoses:  None    ED Discharge Orders    None       Veryl Speak, MD 05/19/17 5009    Veryl Speak, MD 05/19/17 [phone]

## 2017-05-18 NOTE — ED Triage Notes (Addendum)
Patient arrived with EMS from home reports generalized weakness /fatigue for several days and intermittent bilateral lower legs pain , denies injury , ambulatory using walker. CBG= 179 by EMS .

## 2017-05-19 ENCOUNTER — Other Ambulatory Visit: Payer: Self-pay | Admitting: *Deleted

## 2017-05-19 DIAGNOSIS — E139 Other specified diabetes mellitus without complications: Secondary | ICD-10-CM | POA: Diagnosis not present

## 2017-05-19 DIAGNOSIS — G9340 Encephalopathy, unspecified: Secondary | ICD-10-CM | POA: Diagnosis not present

## 2017-05-19 LAB — URINALYSIS, ROUTINE W REFLEX MICROSCOPIC
Glucose, UA: NEGATIVE mg/dL
Ketones, ur: NEGATIVE mg/dL
NITRITE: NEGATIVE
PH: 7 (ref 5.0–8.0)
Protein, ur: 30 mg/dL — AB
SPECIFIC GRAVITY, URINE: 1.021 (ref 1.005–1.030)

## 2017-05-19 LAB — BRAIN NATRIURETIC PEPTIDE: B Natriuretic Peptide: 47.6 pg/mL (ref 0.0–100.0)

## 2017-05-19 NOTE — Discharge Planning (Signed)
Pt currently active with [facility] for PT/OT services.  Resumption of care requested. Janae Sauce of [facility] notified.  No DME needs identified at this time.

## 2017-05-19 NOTE — Progress Notes (Signed)
CSW spoke with pt at bedside. CSW was informed that at this time pt would like to try out [facility] services as opposed to going to a facility at this time. Pt reports that pt was recently discharged from [facility] in [place] and isn't interested in going back to a facility at this time. CSW to speak with MD and RNCM about setting up these services for pt.    Virgie Dad Sandra Brents, MSW, [facility] Clinical Social Worker [phone]

## 2017-05-19 NOTE — Discharge Instructions (Addendum)
Continue your medications as previously prescribed.  Follow-up with your primary doctor this week, and return to the ER if symptoms significantly worsen or change.

## 2017-05-19 NOTE — Progress Notes (Signed)
CSW spoke with pt's daughter and she is agreeable to pt returning home with [facility] services at this time. CSW spoke with [name] from [facility] and was informed that they discharged pt from [facility] as pt had met goals. [name] informed CSW that if pt was to return to another facility pt would have to pay $160 a day starting at day 21. Pt expressed still wanting to go home at this time. CSW has arranged transport to [place]. Daughter is aware as well. There are no further CSW needs at this time. CSW will sign off at this time. Please reconsult if further need arises.     Virgie Dad Donyea Gafford, MSW, [facility] Clinical Social Worker [phone]

## 2017-05-19 NOTE — ED Notes (Signed)
Patient ambulated the hallway using a walker with RN assistance - short distance with weakness at both legs .

## 2017-05-19 NOTE — Patient Outreach (Signed)
[facility]) Care Management  05/19/2017  Estell Manor 11-02-1934 [id number]   CSW received referral from [place], Christinia Gully 05/15/17 while patient was still at [facility] but patient discharged 05/18/17 home & went to [facility] ED 05/19/17 for weakness & discharged home with home health. Patient was at [facility] 04/29/17 - 05/18/17 (19 days) if pt was to return to another facility pt would have to pay $160 a day starting at day 21. Per chart review, patient's home address: [address] [place]. CSW will make referral to [facility] for transition of care & sign off.    Raynaldo Opitz, LCSW [facility]  Clinical Social Worker cell #: [phone]

## 2017-05-20 ENCOUNTER — Encounter: Payer: Self-pay | Admitting: *Deleted

## 2017-05-20 ENCOUNTER — Other Ambulatory Visit: Payer: Self-pay | Admitting: *Deleted

## 2017-05-20 DIAGNOSIS — Z7984 Long term (current) use of oral hypoglycemic drugs: Secondary | ICD-10-CM | POA: Diagnosis not present

## 2017-05-20 DIAGNOSIS — G4700 Insomnia, unspecified: Secondary | ICD-10-CM | POA: Diagnosis not present

## 2017-05-20 DIAGNOSIS — I10 Essential (primary) hypertension: Secondary | ICD-10-CM | POA: Diagnosis not present

## 2017-05-20 DIAGNOSIS — Z7982 Long term (current) use of aspirin: Secondary | ICD-10-CM | POA: Diagnosis not present

## 2017-05-20 DIAGNOSIS — E119 Type 2 diabetes mellitus without complications: Secondary | ICD-10-CM | POA: Diagnosis not present

## 2017-05-20 DIAGNOSIS — M1990 Unspecified osteoarthritis, unspecified site: Secondary | ICD-10-CM | POA: Diagnosis not present

## 2017-05-20 DIAGNOSIS — J189 Pneumonia, unspecified organism: Secondary | ICD-10-CM | POA: Diagnosis not present

## 2017-05-20 DIAGNOSIS — M109 Gout, unspecified: Secondary | ICD-10-CM | POA: Diagnosis not present

## 2017-05-20 DIAGNOSIS — E669 Obesity, unspecified: Secondary | ICD-10-CM | POA: Diagnosis not present

## 2017-05-20 DIAGNOSIS — K219 Gastro-esophageal reflux disease without esophagitis: Secondary | ICD-10-CM | POA: Diagnosis not present

## 2017-05-20 DIAGNOSIS — E559 Vitamin D deficiency, unspecified: Secondary | ICD-10-CM | POA: Diagnosis not present

## 2017-05-20 DIAGNOSIS — M6281 Muscle weakness (generalized): Secondary | ICD-10-CM | POA: Diagnosis not present

## 2017-05-20 DIAGNOSIS — Z9181 History of falling: Secondary | ICD-10-CM | POA: Diagnosis not present

## 2017-05-20 NOTE — Patient Outreach (Signed)
Referral received [facility] CSW, pt discharged from [facility] on 05/18/17 and to ED on 05/19/17 for weakness, telephone call to pt [phone], "user busy".    PLAN Outreach within 3-4 business days  Jacqlyn Larsen [facility], [place] Coordinator [phone]

## 2017-05-23 ENCOUNTER — Other Ambulatory Visit: Payer: Self-pay | Admitting: *Deleted

## 2017-05-23 DIAGNOSIS — M1990 Unspecified osteoarthritis, unspecified site: Secondary | ICD-10-CM | POA: Diagnosis not present

## 2017-05-23 DIAGNOSIS — Z9181 History of falling: Secondary | ICD-10-CM | POA: Diagnosis not present

## 2017-05-23 DIAGNOSIS — R278 Other lack of coordination: Secondary | ICD-10-CM | POA: Diagnosis not present

## 2017-05-23 DIAGNOSIS — M6281 Muscle weakness (generalized): Secondary | ICD-10-CM | POA: Diagnosis not present

## 2017-05-23 DIAGNOSIS — M109 Gout, unspecified: Secondary | ICD-10-CM | POA: Diagnosis not present

## 2017-05-23 NOTE — Patient Outreach (Addendum)
Referral received [facility] CSW, pt at SNF and discharged 05/18/17, pneumonia, Flu B, has history diabetes, HTN.  2nd attempt outreach, spoke with pt, HIPAA verified, pt reports she lives with her husband who has his own health issues, states her daugher and niece oversees medications, provides transportation, pt states she is not able to reconcile medications over the phone and no one at her home that can, reports " I have everything , the home health nurse has looked at all of it" Pt states she does not have a glucometer and does not check CBG, has not been instructed to.  Pt agreeable to home visit and feels she can be more engaged face to face.  THN CM Care Plan Problem One     Most Recent Value  Care Plan Problem One  Knowledge deficit related to pneumonia  Role Documenting the Problem One  Care Management Coordinator  Care Plan for Problem One  Active  THN Long Term Goal   Pt will verbalize and demonstrate improved self care for prevention of pneumonia and other respiratory illnesses within 60 days  THN Long Term Goal Start Date  05/23/17  Interventions for Problem One Long Term Goal  Pt verified she has follow up primary MD appointment 05/29/17, has transportation, reviewed importance of calling MD for change in symptoms such as fever, increased sputum, etc.  THN CM Short Term Goal #1   Pt will verbalize ways to prevent respiratory infections within 30 days  THN CM Short Term Goal #1 Start Date  05/23/17  Interventions for Short Term Goal #1  RN CM reviewed importance of taking medications as prescribed, good handwashing, recognizing symptoms, avoiding sick persons     PLAN See pt for initial home visit week 06/02/17 Continue weekly transition of care calls  Jacqlyn Larsen [facility], [place] Coordinator [phone]

## 2017-05-29 ENCOUNTER — Other Ambulatory Visit: Payer: Self-pay | Admitting: *Deleted

## 2017-05-29 NOTE — Patient Outreach (Signed)
Telephone call to pt for transition of care week 2, spoke with pt, HIPAA verified, pt reports " doing fairly well, my husband's in the hospital"  Pt states her family continues to assist her with transportation, overseeing medications, etc.  Pt reports home health continues to work with her, pt reports she is taking all medications as prescribed, no new concerns reported.  THN CM Care Plan Problem One     Most Recent Value  Care Plan Problem One  Knowledge deficit related to pneumonia  Role Documenting the Problem One  Care Management Coordinator  Care Plan for Problem One  Active  THN Long Term Goal   Pt will verbalize and demonstrate improved self care for prevention of pneumonia and other respiratory illnesses within 60 days  THN Long Term Goal Start Date  05/23/17  Interventions for Problem One Long Term Goal  RN CM reviewed upcoming follow up appointments, pt states primary care appointment cancelled for today 4/25 due to patient's husband is in the hospital, it has been rescheduled, pt unsure of the date.  THN CM Short Term Goal #1   Pt will verbalize ways to prevent respiratory infections within 30 days  THN CM Short Term Goal #1 Start Date  05/23/17  Interventions for Short Term Goal #1  RN CM reviewed importance of taking all medications as prescribed, avoiding sick persons, consistent handwashing      PLAN Continue weekly transition of care calls See pt for initial home visit next week  Jacqlyn Larsen [facility], [place] [phone]

## 2017-06-02 ENCOUNTER — Encounter: Payer: Self-pay | Admitting: *Deleted

## 2017-06-02 ENCOUNTER — Other Ambulatory Visit: Payer: Self-pay | Admitting: *Deleted

## 2017-06-02 NOTE — Patient Outreach (Signed)
[facility]) Care Management   06/02/2017  [place] 1934/11/02 [id number]  Melissa Dalton is an 83 y.o. female  Subjective: Initial home visit with pt, HIPAA verified, pt reports she no longer drives, lives with her husband who is in poor health, different family members assist pt with transportation, grocery shopping and her daughter visits almost daily.  Pt reports she does not walk much, stays in chair most of day.  Pt not interested in another level of care, did not seem interested about in home care to assist.  RN CM questioned pt about checking CBG and pt states " I don't have a glucometer and I don't check blood sugar, no one tells me to"  Pt daughter reports pt does have a glucometer but has not used it in awhile, daughter states she has her own health issues and is limited with what she can assist with.    Objective:   ROS  Physical Exam  Constitutional: She is oriented to person, place, and time. She appears well-developed and well-nourished.  HENT:  Head: Normocephalic.  Neck: Normal range of motion. Neck supple.  Cardiovascular: Normal rate.  Respiratory: Effort normal and breath sounds normal.  GI: Soft. Bowel sounds are normal.  Musculoskeletal: Normal range of motion. She exhibits edema.  1+ edema lower extremities bil  Neurological: She is alert and oriented to person, place, and time.  Skin: Skin is warm and dry.  Psychiatric: She has a normal mood and affect. Her behavior is normal.  forgetful    Encounter Medications:   Outpatient Encounter Medications as of 06/02/2017  Medication Sig  . Cholecalciferol (VITAMIN D) 1000 UNITS capsule 1,000 mg. Take 1,000 mg by mouth.  . gabapentin (NEURONTIN) 300 MG capsule Take 300 mg by mouth 2 (two) times daily.  Marland Kitchen glipiZIDE (GLUCOTROL) 10 MG tablet Take 10 mg by mouth daily before breakfast. TAKE ONE TABLET BY MOUTH TWICE DAILY BEFORE A MEAL  . metFORMIN (GLUCOPHAGE) 1000 MG tablet Take 1,000 mg by  mouth 2 (two) times daily.  . pantoprazole (PROTONIX) 40 MG tablet TAKE ONE TABLET BY MOUTH ONCE DAILY (Patient taking differently: TAKE 40 mg TABLET BY MOUTH ONCE DAILY)  . potassium chloride SA (KLOR-CON M20) 20 MEQ tablet Take 20 mEq by mouth once. TAKE ONE TABLET BY MOUTH EVERY DAY.  . pravastatin (PRAVACHOL) 40 MG tablet Take 40 mg by mouth daily. TAKE ONE TABLET BY MOUTH EVERY DAY AS DIRECTED  . ranitidine (ZANTAC) 300 MG capsule Take 1 capsule (300 mg total) by mouth every evening.  Marland Kitchen allopurinol (ZYLOPRIM) 300 MG tablet Take 300 mg by mouth 2 (two) times daily. TAKE ONE TABLET BY MOUTH ONCE DAILY  . aspirin 325 MG tablet Take 650 mg by mouth daily. Take 325 mg by mouth 2 (two) times daily.    . benzonatate (TESSALON) 100 MG capsule Take 1 capsule (100 mg total) by mouth every 8 (eight) hours. (Patient not taking: Reported on 06/02/2017)  . fenofibrate 160 MG tablet Take 160 mg by mouth daily. TAKE ONE TABLET BY MOUTH EVERY DAY  . fexofenadine (ALLEGRA) 180 MG tablet 180 mg. Take 180 mg by mouth daily.  . niacin (NIASPAN) 500 MG CR tablet Take 500 mg by mouth daily.    No facility-administered encounter medications on file as of 06/02/2017.     Functional Status:   In your present state of health, do you have any difficulty performing the following activities: 06/02/2017 04/24/2017  Hearing? N N  Vision? N N  Difficulty concentrating or making decisions? N Y  Walking or climbing stairs? Y Y  Dressing or bathing? Y Y  Doing errands, shopping? Tempie Donning  Preparing Food and eating ? Y -  Using the Toilet? N -  In the past six months, have you accidently leaked urine? Y -  Do you have problems with loss of bowel control? N -  Managing your Medications? N -  Managing your Finances? N -  Housekeeping or managing your Housekeeping? Y -  Some recent data might be hidden    Fall/Depression Screening:    Fall Risk  06/02/2017  Falls in the past year? Yes  Number falls in past yr: 1  Injury  with Fall? No  Risk for fall due to : History of fall(s);Medication side effect  Follow up Falls evaluation completed;Falls prevention discussed   PHQ 2/9 Scores 06/02/2017  PHQ - 2 Score 0    Assessment:  Pt does not appear to be open to most of RN CM suggestions, seems apathetic to issues with health conditions especially diabetes and not willing to check CBG, daughter states last AIC was below 6, diabetes management is not a goal pt wants to work towards. RN CM requested most recent AIC from primary MD office and reported pt is not checking CBG, Rn CM faxed initial home visit and barrier letter to primary care MD. Patient's daughter present for part of home visit today but states she is limited as to what she is able to do. Pt does not discuss any other options for care when subject brought up by RN CM.  [facility] CM Care Plan Problem One     Most Recent Value  Care Plan Problem One  Knowledge deficit related to pneumonia  Role Documenting the Problem One  Care Management Coordinator  [facility] Long Term Goal   Pt will verbalize and demonstrate improved self care for prevention of pneumonia and other respiratory illnesses within 60 days  [facility] Long Term Goal Start Date  05/23/17  Interventions for Problem One Long Term Goal  RN CM gave [facility] calendar and reviewed with pt, gave 24 hour nurse line magnet, pt to see primary care MD 06/04/17, RN CM reviewed importance of following plan of care with home health  [facility] CM Short Term Goal #1   Pt will verbalize ways to prevent respiratory infections within 30 days  [facility] CM Short Term Goal #1 Start Date  05/23/17  Interventions for Short Term Goal #1  RN CM reviewed signs/ symptoms exacerbation of respiratory issues, reviewed pollen counts are high and may need to wear a mask outdoors, RN CM observed medications and reviewed with pt, gave prefilled medication box and spoke wth daughter Wynell Balloon about prefilling  [facility] CM Short Term Goal #2   Pt will take all medications as  prescribed within 30 days  [facility] CM Short Term Goal #2 Start Date  06/02/17  Interventions for Short Term Goal #2  RN CM asked patient's daughter [name] to provide more oversight as she visits her mother almost daily, [name] states she will start prefilling med box, RN CM ask pt to take all medications to primary MD appoinment this week and get a printed list of all medications/ dosages.     [facility] CM Care Plan Problem Two     Most Recent Value  Care Plan Problem Two  Pt high risk for falls  Role Documenting the Problem Two  Care Management Coordinator  Care Plan  for Problem Two  Active  [facility] CM Short Term Goal #1   Pt will work with home health PT to increase endurance, stamina within 30 days  [facility] CM Short Term Goal #1 Start Date  06/02/17  Interventions for Short Term Goal #2   RN CM encouraged pt to work with home health PT and complete prescribed exercises daily, pt verbalizes she is not enjoying working with PT and "would like to quit"  pt states she does not walk much and does not have a desire to "get up", pt refused TUG test today during home visit      Plan: see pt for home visit next month  Jacqlyn Larsen [facility], [place] [phone]

## 2017-06-04 DIAGNOSIS — K210 Gastro-esophageal reflux disease with esophagitis: Secondary | ICD-10-CM | POA: Diagnosis not present

## 2017-06-04 DIAGNOSIS — G63 Polyneuropathy in diseases classified elsewhere: Secondary | ICD-10-CM | POA: Diagnosis not present

## 2017-06-04 DIAGNOSIS — E1169 Type 2 diabetes mellitus with other specified complication: Secondary | ICD-10-CM | POA: Diagnosis not present

## 2017-06-04 DIAGNOSIS — I10 Essential (primary) hypertension: Secondary | ICD-10-CM | POA: Diagnosis not present

## 2017-06-04 DIAGNOSIS — E119 Type 2 diabetes mellitus without complications: Secondary | ICD-10-CM | POA: Diagnosis not present

## 2017-06-04 DIAGNOSIS — R54 Age-related physical debility: Secondary | ICD-10-CM | POA: Diagnosis not present

## 2017-06-04 DIAGNOSIS — F329 Major depressive disorder, single episode, unspecified: Secondary | ICD-10-CM | POA: Diagnosis not present

## 2017-06-04 DIAGNOSIS — E782 Mixed hyperlipidemia: Secondary | ICD-10-CM | POA: Diagnosis not present

## 2017-06-17 ENCOUNTER — Encounter (HOSPITAL_COMMUNITY): Payer: Self-pay | Admitting: Emergency Medicine

## 2017-06-17 ENCOUNTER — Emergency Department (HOSPITAL_COMMUNITY): Payer: PPO

## 2017-06-17 ENCOUNTER — Other Ambulatory Visit: Payer: Self-pay

## 2017-06-17 ENCOUNTER — Inpatient Hospital Stay (HOSPITAL_COMMUNITY)
Admission: EM | Admit: 2017-06-17 | Discharge: 2017-06-20 | DRG: 871 | Disposition: A | Discharge status: Home Health | Payer: PPO | Attending: Family Medicine | Admitting: Family Medicine

## 2017-06-17 DIAGNOSIS — R402142 Coma scale, eyes open, spontaneous, at arrival to emergency department: Secondary | ICD-10-CM | POA: Diagnosis not present

## 2017-06-17 DIAGNOSIS — E7800 Pure hypercholesterolemia, unspecified: Secondary | ICD-10-CM | POA: Diagnosis not present

## 2017-06-17 DIAGNOSIS — N179 Acute kidney failure, unspecified: Secondary | ICD-10-CM

## 2017-06-17 DIAGNOSIS — N3000 Acute cystitis without hematuria: Secondary | ICD-10-CM | POA: Diagnosis not present

## 2017-06-17 DIAGNOSIS — F17290 Nicotine dependence, other tobacco product, uncomplicated: Secondary | ICD-10-CM | POA: Diagnosis not present

## 2017-06-17 DIAGNOSIS — A419 Sepsis, unspecified organism: Principal | ICD-10-CM | POA: Diagnosis present

## 2017-06-17 DIAGNOSIS — R404 Transient alteration of awareness: Secondary | ICD-10-CM | POA: Diagnosis not present

## 2017-06-17 DIAGNOSIS — M1990 Unspecified osteoarthritis, unspecified site: Secondary | ICD-10-CM | POA: Diagnosis present

## 2017-06-17 DIAGNOSIS — F418 Other specified anxiety disorders: Secondary | ICD-10-CM | POA: Diagnosis not present

## 2017-06-17 DIAGNOSIS — R410 Disorientation, unspecified: Secondary | ICD-10-CM | POA: Diagnosis not present

## 2017-06-17 DIAGNOSIS — N170 Acute kidney failure with tubular necrosis: Secondary | ICD-10-CM | POA: Diagnosis not present

## 2017-06-17 DIAGNOSIS — Z79899 Other long term (current) drug therapy: Secondary | ICD-10-CM | POA: Diagnosis not present

## 2017-06-17 DIAGNOSIS — E785 Hyperlipidemia, unspecified: Secondary | ICD-10-CM | POA: Diagnosis not present

## 2017-06-17 DIAGNOSIS — I959 Hypotension, unspecified: Secondary | ICD-10-CM | POA: Diagnosis not present

## 2017-06-17 DIAGNOSIS — Z880 Allergy status to penicillin: Secondary | ICD-10-CM

## 2017-06-17 DIAGNOSIS — R402252 Coma scale, best verbal response, oriented, at arrival to emergency department: Secondary | ICD-10-CM | POA: Diagnosis not present

## 2017-06-17 DIAGNOSIS — R402362 Coma scale, best motor response, obeys commands, at arrival to emergency department: Secondary | ICD-10-CM | POA: Diagnosis present

## 2017-06-17 DIAGNOSIS — Z6841 Body Mass Index (BMI) 40.0 and over, adult: Secondary | ICD-10-CM | POA: Diagnosis not present

## 2017-06-17 DIAGNOSIS — R531 Weakness: Secondary | ICD-10-CM

## 2017-06-17 DIAGNOSIS — G9341 Metabolic encephalopathy: Secondary | ICD-10-CM | POA: Diagnosis not present

## 2017-06-17 DIAGNOSIS — I10 Essential (primary) hypertension: Secondary | ICD-10-CM | POA: Diagnosis present

## 2017-06-17 DIAGNOSIS — K210 Gastro-esophageal reflux disease with esophagitis: Secondary | ICD-10-CM | POA: Diagnosis not present

## 2017-06-17 DIAGNOSIS — N390 Urinary tract infection, site not specified: Secondary | ICD-10-CM | POA: Diagnosis not present

## 2017-06-17 DIAGNOSIS — R6520 Severe sepsis without septic shock: Secondary | ICD-10-CM | POA: Diagnosis not present

## 2017-06-17 DIAGNOSIS — K219 Gastro-esophageal reflux disease without esophagitis: Secondary | ICD-10-CM | POA: Diagnosis present

## 2017-06-17 DIAGNOSIS — E119 Type 2 diabetes mellitus without complications: Secondary | ICD-10-CM | POA: Diagnosis present

## 2017-06-17 DIAGNOSIS — Z7984 Long term (current) use of oral hypoglycemic drugs: Secondary | ICD-10-CM | POA: Diagnosis not present

## 2017-06-17 DIAGNOSIS — R350 Frequency of micturition: Secondary | ICD-10-CM | POA: Diagnosis not present

## 2017-06-17 DIAGNOSIS — Z833 Family history of diabetes mellitus: Secondary | ICD-10-CM | POA: Diagnosis not present

## 2017-06-17 DIAGNOSIS — Z885 Allergy status to narcotic agent status: Secondary | ICD-10-CM

## 2017-06-17 DIAGNOSIS — R4020 Unspecified coma: Secondary | ICD-10-CM | POA: Diagnosis not present

## 2017-06-17 HISTORY — DX: Major depressive disorder, single episode, unspecified: F32.9

## 2017-06-17 HISTORY — DX: Urinary tract infection, site not specified: N39.0

## 2017-06-17 HISTORY — DX: Other specified postprocedural states: Z98.890

## 2017-06-17 HISTORY — DX: Other complications of anesthesia, initial encounter: T88.59XA

## 2017-06-17 HISTORY — DX: Adverse effect of unspecified anesthetic, initial encounter: T41.45XA

## 2017-06-17 HISTORY — DX: Anxiety disorder, unspecified: F41.9

## 2017-06-17 HISTORY — DX: Nausea with vomiting, unspecified: R11.2

## 2017-06-17 HISTORY — DX: Depression, unspecified: F32.A

## 2017-06-17 LAB — COMPREHENSIVE METABOLIC PANEL
ALK PHOS: 42 U/L (ref 38–126)
ALT: 20 U/L (ref 14–54)
AST: 34 U/L (ref 15–41)
Albumin: 3.3 g/dL — ABNORMAL LOW (ref 3.5–5.0)
Anion gap: 18 — ABNORMAL HIGH (ref 5–15)
BUN: 25 mg/dL — AB (ref 6–20)
CALCIUM: 8.4 mg/dL — AB (ref 8.9–10.3)
CHLORIDE: 99 mmol/L — AB (ref 101–111)
CO2: 28 mmol/L (ref 22–32)
CREATININE: 1.29 mg/dL — AB (ref 0.44–1.00)
GFR calc non Af Amer: 38 mL/min — ABNORMAL LOW (ref >60)
GFR, EST AFRICAN AMERICAN: 44 mL/min — AB (ref >60)
GLUCOSE: 78 mg/dL (ref 65–99)
Potassium: 3.9 mmol/L (ref 3.5–5.1)
SODIUM: 145 mmol/L (ref 135–145)
Total Bilirubin: 0.8 mg/dL (ref 0.3–1.2)
Total Protein: 6.7 g/dL (ref 6.5–8.1)

## 2017-06-17 LAB — CBC WITH DIFFERENTIAL/PLATELET
ABS IMMATURE GRANULOCYTES: 0.1 10*3/uL (ref 0.0–0.1)
Basophils Absolute: 0.1 10*3/uL (ref 0.0–0.1)
Basophils Relative: 1 %
EOS PCT: 1 %
Eosinophils Absolute: 0.1 10*3/uL (ref 0.0–0.7)
HCT: 40.6 % (ref 36.0–46.0)
HEMOGLOBIN: 13.4 g/dL (ref 12.0–15.0)
Immature Granulocytes: 1 %
LYMPHS ABS: 1.7 10*3/uL (ref 0.7–4.0)
LYMPHS PCT: 15 %
MCH: 32.3 pg (ref 26.0–34.0)
MCHC: 33 g/dL (ref 30.0–36.0)
MCV: 97.8 fL (ref 78.0–100.0)
MONO ABS: 1 10*3/uL (ref 0.1–1.0)
MONOS PCT: 9 %
NEUTROS ABS: 8 10*3/uL — AB (ref 1.7–7.7)
Neutrophils Relative %: 73 %
Platelets: 305 10*3/uL (ref 150–400)
RBC: 4.15 MIL/uL (ref 3.87–5.11)
RDW: 14.3 % (ref 11.5–15.5)
WBC: 10.9 10*3/uL — ABNORMAL HIGH (ref 4.0–10.5)

## 2017-06-17 LAB — URINALYSIS, ROUTINE W REFLEX MICROSCOPIC
Bilirubin Urine: NEGATIVE
GLUCOSE, UA: NEGATIVE mg/dL
HGB URINE DIPSTICK: NEGATIVE
KETONES UR: NEGATIVE mg/dL
LEUKOCYTES UA: NEGATIVE
NITRITE: POSITIVE — AB
PROTEIN: NEGATIVE mg/dL
Specific Gravity, Urine: 1.008 (ref 1.005–1.030)
pH: 5 (ref 5.0–8.0)

## 2017-06-17 LAB — CBG MONITORING, ED
GLUCOSE-CAPILLARY: 65 mg/dL (ref 65–99)
Glucose-Capillary: 143 mg/dL — ABNORMAL HIGH (ref 65–99)

## 2017-06-17 LAB — I-STAT CG4 LACTIC ACID, ED
Lactic Acid, Venous: 4.72 mmol/L (ref 0.5–1.9)
Lactic Acid, Venous: 2.82 mmol/L (ref 0.5–1.9)

## 2017-06-17 LAB — PROTIME-INR
INR: 1.08
Prothrombin Time: 13.9 seconds (ref 11.4–15.2)

## 2017-06-17 MED ORDER — ENOXAPARIN SODIUM 40 MG/0.4ML ~~LOC~~ SOLN
40.0000 mg | Freq: Every day | SUBCUTANEOUS | Status: DC
  Administered 2017-06-18 – 2017-06-20 (×3): 40 mg via SUBCUTANEOUS
  Filled 2017-06-17 (×3): qty 0.4

## 2017-06-17 MED ORDER — PANTOPRAZOLE SODIUM 40 MG PO TBEC: 40.0000 mg | DELAYED_RELEASE_TABLET | Freq: Every day | ORAL | Status: DC

## 2017-06-17 MED ORDER — ONDANSETRON HCL 4 MG/2ML IJ SOLN: 4.0000 mg | Freq: Four times a day (QID) | INTRAMUSCULAR | Status: DC | PRN

## 2017-06-17 MED ORDER — SODIUM CHLORIDE 0.9 % IV SOLN
INTRAVENOUS | Status: DC
  Administered 2017-06-17 – 2017-06-19 (×3): via INTRAVENOUS

## 2017-06-17 MED ORDER — ZOLPIDEM TARTRATE 5 MG PO TABS
5.0000 mg | ORAL_TABLET | Freq: Every evening | ORAL | Status: DC | PRN
  Administered 2017-06-17: 5 mg via ORAL
  Filled 2017-06-17: qty 1

## 2017-06-17 MED ORDER — DEXTROSE 50 % IV SOLN
INTRAVENOUS | Status: AC
  Administered 2017-06-17: 50 mL
  Filled 2017-06-17: qty 50

## 2017-06-17 MED ORDER — SODIUM CHLORIDE 0.9 % IV SOLN: 1.0000 g | INTRAVENOUS | Status: DC

## 2017-06-17 MED ORDER — DULOXETINE HCL 30 MG PO CPEP
30.0000 mg | ORAL_CAPSULE | Freq: Every day | ORAL | Status: DC
  Administered 2017-06-18 – 2017-06-20 (×3): 30 mg via ORAL
  Filled 2017-06-17 (×3): qty 1

## 2017-06-17 MED ORDER — VITAMIN D 1000 UNITS PO TABS
1000.0000 [IU] | ORAL_TABLET | Freq: Every day | ORAL | Status: DC
  Administered 2017-06-18 – 2017-06-20 (×3): 1000 [IU] via ORAL
  Filled 2017-06-17 (×3): qty 1

## 2017-06-17 MED ORDER — ASPIRIN 325 MG PO TABS
325.0000 mg | ORAL_TABLET | Freq: Every day | ORAL | Status: DC
  Administered 2017-06-18 – 2017-06-20 (×3): 325 mg via ORAL
  Filled 2017-06-17 (×3): qty 1

## 2017-06-17 MED ORDER — GABAPENTIN 300 MG PO CAPS: 300.0000 mg | ORAL_CAPSULE | ORAL | Status: DC

## 2017-06-17 MED ORDER — GABAPENTIN 300 MG PO CAPS
600.0000 mg | ORAL_CAPSULE | Freq: Every day | ORAL | Status: DC
  Administered 2017-06-17 – 2017-06-19 (×3): 600 mg via ORAL
  Filled 2017-06-17 (×3): qty 2

## 2017-06-17 MED ORDER — SENNOSIDES-DOCUSATE SODIUM 8.6-50 MG PO TABS: 1.0000 | ORAL_TABLET | Freq: Every evening | ORAL | Status: DC | PRN

## 2017-06-17 MED ORDER — HYDRALAZINE HCL 20 MG/ML IJ SOLN: 5.0000 mg | INTRAMUSCULAR | Status: DC | PRN

## 2017-06-17 MED ORDER — PRAVASTATIN SODIUM 40 MG PO TABS
40.0000 mg | ORAL_TABLET | Freq: Every day | ORAL | Status: DC
  Administered 2017-06-18 – 2017-06-20 (×3): 40 mg via ORAL
  Filled 2017-06-17 (×3): qty 1

## 2017-06-17 MED ORDER — SODIUM CHLORIDE 0.9 % IV BOLUS
1000.0000 mL | Freq: Once | INTRAVENOUS | Status: AC
  Administered 2017-06-17: 1000 mL via INTRAVENOUS

## 2017-06-17 MED ORDER — GABAPENTIN 300 MG PO CAPS
300.0000 mg | ORAL_CAPSULE | Freq: Every day | ORAL | Status: DC
  Administered 2017-06-18 – 2017-06-20 (×3): 300 mg via ORAL
  Filled 2017-06-17 (×3): qty 1

## 2017-06-17 MED ORDER — FAMOTIDINE 20 MG PO TABS
40.0000 mg | ORAL_TABLET | Freq: Every day | ORAL | Status: DC
  Administered 2017-06-17 – 2017-06-19 (×3): 40 mg via ORAL
  Filled 2017-06-17 (×4): qty 2

## 2017-06-17 MED ORDER — AZO BLADDER CONTROL/GO-LESS PO CAPS: 2.0000 | ORAL_CAPSULE | Freq: Every evening | ORAL | Status: DC | PRN

## 2017-06-17 MED ORDER — LORATADINE 10 MG PO TABS
10.0000 mg | ORAL_TABLET | Freq: Every day | ORAL | Status: DC
  Administered 2017-06-18 – 2017-06-19 (×2): 10 mg via ORAL
  Filled 2017-06-17 (×2): qty 1

## 2017-06-17 MED ORDER — ACETAMINOPHEN 325 MG PO TABS: 650.0000 mg | ORAL_TABLET | Freq: Four times a day (QID) | ORAL | Status: DC | PRN

## 2017-06-17 MED ORDER — ACETAMINOPHEN 650 MG RE SUPP: 650.0000 mg | Freq: Four times a day (QID) | RECTAL | Status: DC | PRN

## 2017-06-17 MED ORDER — SODIUM CHLORIDE 0.9 % IV SOLN
1.0000 g | Freq: Once | INTRAVENOUS | Status: AC
  Administered 2017-06-17: 1 g via INTRAVENOUS
  Filled 2017-06-17: qty 10

## 2017-06-17 MED ORDER — ONDANSETRON HCL 4 MG PO TABS: 4.0000 mg | ORAL_TABLET | Freq: Four times a day (QID) | ORAL | Status: DC | PRN

## 2017-06-17 MED ORDER — ALPRAZOLAM 0.25 MG PO TABS
0.2500 mg | ORAL_TABLET | Freq: Three times a day (TID) | ORAL | Status: DC | PRN
  Administered 2017-06-19: 0.25 mg via ORAL
  Filled 2017-06-17: qty 1

## 2017-06-17 NOTE — ED Notes (Signed)
Spoke with EDP about sepsis protocol 4mL/kg fluid resuscitation and antibiotics. Awaiting further orders.

## 2017-06-17 NOTE — Progress Notes (Signed)
PHARMACIST - PHYSICIAN ORDER COMMUNICATION  CONCERNING: P&T Medication Policy on Herbal Medications  DESCRIPTION:  This patient's order for:  AZO Bladder Control  has been noted.  This product(s) is classified as an "herbal" or natural product. Due to a lack of definitive safety studies or FDA approval, nonstandard manufacturing practices, plus the potential risk of unknown drug-drug interactions while on inpatient medications, the Pharmacy and Therapeutics Committee does not permit the use of "herbal" or natural products of this type within [facility].   ACTION TAKEN: The pharmacy department is unable to verify this order at this time and your patient has been informed of this safety policy. Please reevaluate patient's clinical condition at discharge and address if the herbal or natural product(s) should be resumed at that time.

## 2017-06-17 NOTE — ED Provider Notes (Signed)
[place] EMERGENCY DEPARTMENT Provider Note   CSN: [id number] Arrival date & time: 06/17/17  1846     History   Chief Complaint Chief Complaint  Patient presents with  . Altered Mental Status  . Hypotension    HPI Melissa Dalton is a 83 y.o. female.  83 year old female who lives at home with her daughters who are helping with the history.  They said for the last couple of days she has had more frequent urination and generalized weakness and decreased ability to do any ambulating or transfers.  They have also said she has been slightly confused at times.  This is typical for her when she gets UTIs and she was last admitted a couple months ago for same.  There is been no recent falls.  She denies fever.  Her appetite has been decreased although she says she is been drinking plenty of fluids.  She denies any dysuria and no chest pain no shortness of breath no cough no headache no visual symptoms no abdominal pain.  EMS found her blood pressure to be 80/40 on arrival and gave her fluids and Zofran with improvement here.  The history is provided by the patient and a relative.  Altered Mental Status   This is a recurrent problem. The current episode started 2 days ago. The problem has not changed since onset.Associated symptoms include confusion and weakness. Pertinent negatives include no seizures. Risk factors include a recent infection. Her past medical history is significant for diabetes and hypertension.    Past Medical History:  Diagnosis Date  . Arthritis   . Diabetes ([place])   . High cholesterol   . HTN (hypertension)     Patient Active Problem List   Diagnosis Date Noted  . Acute encephalopathy 04/24/2017  . Weakness 04/24/2017  . Diabetes mellitus type 2 in obese ([place]) 04/24/2017  . CAP (community acquired pneumonia) 04/24/2017  . HLD (hyperlipidemia) 04/24/2017  . Gout 04/24/2017  . Coronary artery calcification 12/20/2012  . Multiple lung nodules  10/25/2012  . Chronic cough 10/25/2012    Past Surgical History:  Procedure Laterality Date  . HERNIA REPAIR    . TOTAL ABDOMINAL HYSTERECTOMY    . TUMOR REMOVAL       OB History   None      Home Medications    Prior to Admission medications   Medication Sig Start Date End Date Taking? Authorizing Provider  allopurinol (ZYLOPRIM) 300 MG tablet Take 300 mg by mouth 2 (two) times daily. TAKE ONE TABLET BY MOUTH ONCE DAILY 09/09/12   [provider]  aspirin 325 MG tablet Take 650 mg by mouth daily. Take 325 mg by mouth 2 (two) times daily.      [provider]  benzonatate (TESSALON) 100 MG capsule Take 1 capsule (100 mg total) by mouth every 8 (eight) hours. Patient not taking: Reported on 06/02/2017 04/22/17   Deno Etienne, DO  Cholecalciferol (VITAMIN D) 1000 UNITS capsule 1,000 mg. Take 1,000 mg by mouth.    [provider]  fenofibrate 160 MG tablet Take 160 mg by mouth daily. TAKE ONE TABLET BY MOUTH EVERY DAY 10/22/12   [provider]  fexofenadine (ALLEGRA) 180 MG tablet 180 mg. Take 180 mg by mouth daily.    [provider]  gabapentin (NEURONTIN) 300 MG capsule Take 300 mg by mouth 2 (two) times daily. 03/18/17   [provider]  glipiZIDE (GLUCOTROL) 10 MG tablet Take 10 mg by mouth  daily before breakfast. TAKE ONE TABLET BY MOUTH TWICE DAILY BEFORE A MEAL 06/30/12   [provider]  metFORMIN (GLUCOPHAGE) 1000 MG tablet Take 1,000 mg by mouth 2 (two) times daily. 02/10/17   [provider]  niacin (NIASPAN) 500 MG CR tablet Take 500 mg by mouth daily.  01/31/11   [provider]  pantoprazole (PROTONIX) 40 MG tablet TAKE ONE TABLET BY MOUTH ONCE DAILY Patient taking differently: TAKE 40 mg TABLET BY MOUTH ONCE DAILY 10/20/13   Brand Males, MD  potassium chloride SA (KLOR-CON M20) 20 MEQ tablet Take 20 mEq by mouth once. TAKE ONE TABLET BY MOUTH EVERY DAY. 08/06/12   [provider]    pravastatin (PRAVACHOL) 40 MG tablet Take 40 mg by mouth daily. TAKE ONE TABLET BY MOUTH EVERY DAY AS DIRECTED 08/18/12   [provider]  ranitidine (ZANTAC) 300 MG capsule Take 1 capsule (300 mg total) by mouth every evening. 02/08/13   Brand Males, MD    Family History Family History  Problem Relation Age of Onset  . Heart disease Father   . Diabetes Mother     Social History Social History   Tobacco Use  . Smoking status: Never Smoker  . Smokeless tobacco: Current User  Substance Use Topics  . Alcohol use: No  . Drug use: No     Allergies   Oxycodone; Penicillins; and Tramadol   Review of Systems Review of Systems  Constitutional: Positive for activity change and appetite change. Negative for chills and fever.  HENT: Negative for ear pain and sore throat.   Eyes: Negative for pain and visual disturbance.  Respiratory: Negative for cough and shortness of breath.   Cardiovascular: Negative for chest pain and palpitations.  Gastrointestinal: Negative for abdominal pain and vomiting.  Genitourinary: Positive for frequency. Negative for dysuria and hematuria.  Musculoskeletal: Negative for arthralgias and back pain.  Skin: Negative for color change and rash.  Neurological: Positive for tremors and weakness. Negative for seizures and syncope.  Psychiatric/Behavioral: Positive for confusion.  All other systems reviewed and are negative.    Physical Exam Updated Vital Signs BP 119/74   Pulse 98   Resp (!) 23   Ht 5\' 1"  (1.549 m)   Wt 109.8 kg (242 lb)   SpO2 100%   BMI 45.73 kg/m   Physical Exam  Constitutional: She is oriented to person, place, and time. She appears well-developed and well-nourished. No distress.  HENT:  Head: Normocephalic and atraumatic.  Right Ear: External ear normal.  Left Ear: External ear normal.  Nose: Nose normal.  Mouth/Throat: Oropharynx is clear and moist.  Eyes: Pupils are equal, round, and reactive to light.  Conjunctivae and EOM are normal.  Neck: Normal range of motion. Neck supple.  Cardiovascular: Normal rate, regular rhythm, normal heart sounds and intact distal pulses.  Pulmonary/Chest: Effort normal. No respiratory distress. She has no wheezes. She has no rales.  Abdominal: Soft. She exhibits no mass. There is no tenderness. There is no guarding.  Musculoskeletal: Normal range of motion. She exhibits edema. She exhibits no tenderness or deformity.  Neurological: She is alert and oriented to person, place, and time. She displays tremor. She exhibits normal muscle tone. GCS eye subscore is 4. GCS verbal subscore is 5. GCS motor subscore is 6.     ED Treatments / Results  Labs (all labs ordered are listed, but only abnormal results are displayed) Labs Reviewed  COMPREHENSIVE METABOLIC PANEL - Abnormal; Notable for the  following components:      Result Value   Chloride 99 (*)    BUN 25 (*)    Creatinine, Ser 1.29 (*)    Calcium 8.4 (*)    Albumin 3.3 (*)    GFR calc non Af Amer 38 (*)    GFR calc Af Amer 44 (*)    Anion gap 18 (*)    All other components within normal limits  CBC WITH DIFFERENTIAL/PLATELET - Abnormal; Notable for the following components:   WBC 10.9 (*)    Neutro Abs 8.0 (*)    All other components within normal limits  URINALYSIS, ROUTINE W REFLEX MICROSCOPIC - Abnormal; Notable for the following components:   Color, Urine AMBER (*)    Nitrite POSITIVE (*)    Bacteria, UA MANY (*)    All other components within normal limits  BASIC METABOLIC PANEL - Abnormal; Notable for the following components:   Potassium 3.4 (*)    Glucose, Bld 137 (*)    BUN 21 (*)    Creatinine, Ser 1.02 (*)    Calcium 7.3 (*)    GFR calc non Af Amer 51 (*)    GFR calc Af Amer 59 (*)    All other components within normal limits  CBC - Abnormal; Notable for the following components:   RBC 3.52 (*)    Hemoglobin 11.3 (*)    HCT 34.5 (*)    All other components within normal limits    I-STAT CG4 LACTIC ACID, ED - Abnormal; Notable for the following components:   Lactic Acid, Venous 4.72 (*)    All other components within normal limits  I-STAT CG4 LACTIC ACID, ED - Abnormal; Notable for the following components:   Lactic Acid, Venous 2.82 (*)    All other components within normal limits  CBG MONITORING, ED - Abnormal; Notable for the following components:   Glucose-Capillary 143 (*)    All other components within normal limits  CBG MONITORING, ED - Abnormal; Notable for the following components:   Glucose-Capillary 181 (*)    All other components within normal limits  CBG MONITORING, ED - Abnormal; Notable for the following components:   Glucose-Capillary 63 (*)    All other components within normal limits  CBG MONITORING, ED - Abnormal; Notable for the following components:   Glucose-Capillary 120 (*)    All other components within normal limits  CULTURE, BLOOD (ROUTINE X 2)  CULTURE, BLOOD (ROUTINE X 2)  URINE CULTURE  PROTIME-INR  BRAIN NATRIURETIC PEPTIDE  LACTIC ACID, PLASMA  PROCALCITONIN  CBG MONITORING, ED    EKG EKG Interpretation  Date/Time:  Tuesday Jun 17 2017 19:08:42 EDT Ventricular Rate:  109 PR Interval:    QRS Duration: 145 QT Interval:  410 QTC Calculation: 516 R Axis:   -51 Text Interpretation:  tremor making interpretation difficult, likely sinus Right bundle branch block Confirmed by Aletta Edouard [phone]) on 06/17/2017 7:27:15 PM   Radiology Dg Chest 2 View  Result Date: 06/17/2017 CLINICAL DATA:  Sepsis EXAM: CHEST - 2 VIEW COMPARISON:  05/18/2017 FINDINGS: No focal opacity or pleural effusion. Stable cardiomediastinal silhouette with aortic atherosclerosis. No pneumothorax. IMPRESSION: No active cardiopulmonary disease. Electronically Signed   By: Donavan Foil M.D.   On: 06/17/2017 20:53   Ct Head Wo Contrast  Result Date: 06/18/2017 CLINICAL DATA:  Altered LOC generalize weakness EXAM: CT HEAD WITHOUT CONTRAST TECHNIQUE:  Contiguous axial images were obtained from the base of the skull through the vertex without  intravenous contrast. COMPARISON:  MRI brain 04/24/2017, CT brain 04/23/2017 FINDINGS: Brain: No acute territorial infarction, hemorrhage or intracranial mass is visualized. Atrophy with small vessel ischemic changes of the white matter. Stable ventricle size. Vascular: No hyperdense vessels.  Carotid vascular calcification Skull: Normal. Negative for fracture or focal lesion. Sinuses/Orbits: No acute finding. Other: None IMPRESSION: 1. No CT evidence for acute intracranial abnormality. 2. Atrophy with small vessel ischemic changes of the white matter Electronically Signed   By: Donavan Foil M.D.   On: 06/18/2017 01:44    Procedures .Critical Care Performed by: Hayden Rasmussen, MD Authorized by: Hayden Rasmussen, MD   Critical care provider statement:    Critical care time (minutes):  30   Critical care time was exclusive of:  Separately billable procedures and treating other patients   Critical care was necessary to treat or prevent imminent or life-threatening deterioration of the following conditions:  Sepsis   Critical care was time spent personally by me on the following activities:  Development of treatment plan with patient or surrogate, discussions with consultants, evaluation of patient's response to treatment, examination of patient, obtaining history from patient or surrogate, ordering and performing treatments and interventions, ordering and review of laboratory studies, ordering and review of radiographic studies, pulse oximetry, re-evaluation of patient's condition and review of old charts   I assumed direction of critical care for this patient from another provider in my specialty: no     (including critical care time)  Medications Ordered in ED Medications  ALPRAZolam (XANAX) tablet 0.25 mg (has no administration in time range)  aspirin tablet 325 mg (325 mg Oral Given 06/18/17 1028)    cholecalciferol (VITAMIN D) tablet 1,000 Units (1,000 Units Oral Given 06/18/17 1027)  DULoxetine (CYMBALTA) DR capsule 30 mg (30 mg Oral Given 06/18/17 1028)  loratadine (CLARITIN) tablet 10 mg (10 mg Oral Given 06/18/17 1028)  pravastatin (PRAVACHOL) tablet 40 mg (40 mg Oral Given 06/18/17 1028)  famotidine (PEPCID) tablet 40 mg (40 mg Oral Given 06/17/17 2333)  0.9 %  sodium chloride infusion ( Intravenous New Bag/Given 06/17/17 2326)  enoxaparin (LOVENOX) injection 40 mg (has no administration in time range)  acetaminophen (TYLENOL) tablet 650 mg (has no administration in time range)    Or  acetaminophen (TYLENOL) suppository 650 mg (has no administration in time range)  ondansetron (ZOFRAN) tablet 4 mg (has no administration in time range)    Or  ondansetron (ZOFRAN) injection 4 mg (has no administration in time range)  senna-docusate (Senokot-S) tablet 1 tablet (has no administration in time range)  hydrALAZINE (APRESOLINE) injection 5 mg (has no administration in time range)  zolpidem (AMBIEN) tablet 5 mg (5 mg Oral Given 06/17/17 2332)  gabapentin (NEURONTIN) capsule 300 mg (300 mg Oral Given 06/18/17 1028)    And  gabapentin (NEURONTIN) capsule 600 mg (600 mg Oral Given 06/17/17 2332)  dextrose 50 % solution 50 mL (has no administration in time range)  insulin aspart (novoLOG) injection 0-9 Units (0 Units Subcutaneous Not Given 06/18/17 0808)  insulin aspart (novoLOG) injection 0-5 Units (0 Units Subcutaneous Not Given 06/18/17 0021)  lactobacillus acidophilus (BACID) tablet 2 tablet (2 tablets Oral Given 06/18/17 0818)  cefTRIAXone (ROCEPHIN) 2 g in sodium chloride 0.9 % 100 mL IVPB (has no administration in time range)  dextrose 50 % solution (50 mLs  Given 06/17/17 1916)  sodium chloride 0.9 % bolus 1,000 mL (0 mLs Intravenous Stopped 06/17/17 2100)  sodium chloride 0.9 % bolus 1,000 mL (  0 mLs Intravenous Stopped 06/17/17 2100)  cefTRIAXone (ROCEPHIN) 1 g in sodium chloride 0.9 % 100 mL  IVPB (0 g Intravenous Stopped 06/17/17 2130)  sodium chloride 0.9 % bolus 1,000 mL (0 mLs Intravenous Stopped 06/18/17 0849)  potassium chloride SA (K-DUR,KLOR-CON) CR tablet 20 mEq (20 mEq Oral Given 06/18/17 0818)     Initial Impression / Assessment and Plan / ED Course  I have reviewed the triage vital signs and the nursing notes.  Pertinent labs & imaging results that were available during my care of the patient were reviewed by me and considered in my medical decision making (see chart for details).  Clinical Course as of Jun 19 1243  Tue Jun 17, 2017  1927 Patient with a history of UTIs and confusion here with urinary frequency and confusion with low blood pressure.  She is very tremulous but family states she gets like this when she is nervous.  She does not have a fever here.  She is oriented and able to give a history   [MB]  2200 Discussed with tried hospitalist Dr. Blaine Hamper who will evaluate the patient for admission.   [MB]    Clinical Course User Index [MB] Hayden Rasmussen, MD     Final Clinical Impressions(s) / ED Diagnoses   Final diagnoses:  Weakness generalized  Confusion  Sepsis, due to unspecified organism [facility])    ED Discharge Orders    None       Hayden Rasmussen, MD 06/18/17 1247

## 2017-06-17 NOTE — H&P (Signed)
History and Physical    Melissa Dalton NIO:[id number] DOB: Nov 09, 1934 DOA: 06/17/2017  Referring MD/NP/PA:   PCP: Chesley Noon, MD   Patient coming from:  The patient is coming from home.  At baseline, pt is independent for most of ADL.  Chief Complaint: Dysuria, burning on urination, increased urinary frequency, altered mental status  HPI: Melissa Dalton is a 83 y.o. female with medical history significant of hypertension, hyperlipidemia, diabetes mellitus, GERD, depression, anxiety, who presents with dysuria, burning on urination, increased urinary frequency and altered mental status.  Per patient's daughter, patient has been having increased urinary frequency, dysuria and burning on urination in the past several days.  She becomes mildly confused.  No unilateral weakness, numbness or tingling to extremities.  No facial droop, slurred speech, vision change or hearing loss.  Patient does not seem to have chest pain, shortness breath and coughing.  Patient does not have nausea, vomiting, diarrhea, abdominal pain.  Per report, patient had hypotension with blood pressure 80/40, which improved to 142/70 after 2 liters of NS.  Patient blood sugar was low, 65 initially, which improved to 143 after treated with D50 in ED. When I saw pt in ED, she is mildly confused, oriented to person and time, but not to place.  ED Course: pt was found to have urinalysis negative for leukocytes, but positive for nitrite and with many bacteria, WBC 10.9, lactic acid 4.7, 2.82, INR 1.08, acute renal injury with creatinine 1.29, BUN 25, temperature normal, tachycardia, tachypnea, oxygen saturation 94% on room air, chest x-ray negative.  CT head is negative for acute intracranial abnormalities.  Patient is admitted to telemetry bed as inpatient.  Review of Systems: Could not be reviewed accurately due to altered mental status.  Allergy:  Allergies  Allergen Reactions  . Oxycodone Nausea And Vomiting  .  Penicillins Hives and Rash    Has patient had a PCN reaction causing immediate rash, facial/tongue/throat swelling, SOB or lightheadedness with hypotension: Yes Has patient had a PCN reaction causing severe rash involving mucus membranes or skin necrosis: No Has patient had a PCN reaction that required hospitalization: No Has patient had a PCN reaction occurring within the last 10 years: No If all of the above answers are "NO", then may proceed with Cephalosporin use.   . Tramadol Nausea And Vomiting    Past Medical History:  Diagnosis Date  . Arthritis   . Diabetes ([place])   . High cholesterol   . HTN (hypertension)     Past Surgical History:  Procedure Laterality Date  . HERNIA REPAIR    . TOTAL ABDOMINAL HYSTERECTOMY    . TUMOR REMOVAL      Social History:  reports that she has never smoked. She uses smokeless tobacco. She reports that she does not drink alcohol or use drugs.  Family History:  Family History  Problem Relation Age of Onset  . Heart disease Father   . Diabetes Mother      Prior to Admission medications   Medication Sig Start Date End Date Taking? Authorizing Provider  aspirin 325 MG tablet Take 325 mg by mouth daily.    Yes [provider]  DULoxetine (CYMBALTA) 30 MG capsule Take 30 mg by mouth daily.   Yes [provider]  fexofenadine (ALLEGRA) 180 MG tablet Take 180 mg by mouth daily.    Yes [provider]  gabapentin (NEURONTIN) 300 MG capsule Take 300-600 mg by mouth See admin instructions. Take 300 mg by  mouth in the morning and 600 mg at bedtime 03/18/17  Yes [provider]  glipiZIDE (GLUCOTROL) 10 MG tablet Take 10 mg by mouth 2 (two) times daily before a meal.  06/30/12  Yes [provider]  losartan (COZAAR) 50 MG tablet Take 50 mg by mouth daily. 06/17/17  Yes [provider]  metFORMIN (GLUCOPHAGE) 1000 MG tablet Take 1,000 mg by mouth 2 (two) times daily. 02/10/17  Yes [provider]    potassium chloride SA (KLOR-CON M20) 20 MEQ tablet Take 20 mEq by mouth 2 (two) times daily.  08/06/12  Yes [provider]  pravastatin (PRAVACHOL) 40 MG tablet Take 40 mg by mouth daily.  08/18/12  Yes [provider]  Pumpkin Seed-Soy Germ (AZO BLADDER CONTROL/GO-LESS) CAPS Take 2 capsules by mouth daily as needed (when bladder issues arise).   Yes [provider]  ranitidine (ZANTAC) 300 MG tablet Take 300 mg by mouth daily.   Yes [provider]  torsemide (DEMADEX) 20 MG tablet Take 20 mg by mouth daily.   Yes [provider]  ALPRAZolam (XANAX) 0.25 MG tablet Take 0.25 mg by mouth 3 (three) times daily as needed for anxiety. 06/10/17   [provider]  benzonatate (TESSALON) 100 MG capsule Take 1 capsule (100 mg total) by mouth every 8 (eight) hours. 04/22/17   Deno Etienne, DO  Cholecalciferol (VITAMIN D) 1000 UNITS capsule 1,000 mg. Take 1,000 mg by mouth.    [provider]  fenofibrate 160 MG tablet Take 160 mg by mouth daily. TAKE ONE TABLET BY MOUTH EVERY DAY 10/22/12   [provider]  pantoprazole (PROTONIX) 40 MG tablet TAKE ONE TABLET BY MOUTH ONCE DAILY Patient taking differently: Take 40 mg by mouth once a day 10/20/13   Brand Males, MD  ranitidine (ZANTAC) 300 MG capsule Take 1 capsule (300 mg total) by mouth every evening. Patient not taking: Reported on 06/17/2017 02/08/13   Brand Males, MD    Physical Exam: Vitals:   06/18/17 0145 06/18/17 0215 06/18/17 0300 06/18/17 0400  BP: 118/76 113/65 (!) 102/57 (!) 105/49  Pulse: (!) 107 (!) 106 (!) 102 (!) 101  Resp: (!) 24 (!) 24 (!) 21 (!) 22  Temp:      TempSrc:      SpO2: 91% 93% 96% 96%  Weight:      Height:       General: Not in acute distress HEENT:       Eyes: PERRL, EOMI, no scleral icterus.       ENT: No discharge from the ears and nose, no pharynx injection, no tonsillar enlargement.        Neck: No JVD, no bruit, no mass felt. Heme: No  neck lymph node enlargement. Cardiac: S1/S2, RRR, No murmurs, No gallops or rubs. Respiratory: No rales, wheezing, rhonchi or rubs. GI: Soft, nondistended, nontender, no rebound pain, no organomegaly, BS present. GU: No hematuria Ext: has trace leg edema bilaterally. 2+DP/PT pulse bilaterally. Musculoskeletal: No joint deformities, No joint redness or warmth, no limitation of ROM in spin. Skin: No rashes.  Neuro:  mildly confused, oriented to person and time, but not to place. Cranial nerves II-XII grossly intact, moves all extremities normally Psych: Patient is not psychotic, no suicidal or hemocidal ideation.  Labs on Admission: I have personally reviewed following labs and imaging studies  CBC: Recent Labs  Lab 06/17/17 1919  WBC 10.9*  NEUTROABS 8.0*  HGB 13.4  HCT 40.6  MCV 97.8  PLT 694   Basic Metabolic Panel: Recent Labs  Lab 06/17/17 1919  NA 145  K 3.9  CL 99*  CO2 28  GLUCOSE 78  BUN 25*  CREATININE 1.29*  CALCIUM 8.4*   GFR: Estimated Creatinine Clearance: 39.9 mL/min (A) (by C-G formula based on SCr of 1.29 mg/dL (H)). Liver Function Tests: Recent Labs  Lab 06/17/17 1919  AST 34  ALT 20  ALKPHOS 42  BILITOT 0.8  PROT 6.7  ALBUMIN 3.3*   No results for input(s): LIPASE, AMYLASE in the last 168 hours. No results for input(s): AMMONIA in the last 168 hours. Coagulation Profile: Recent Labs  Lab 06/17/17 1919  INR 1.08   Cardiac Enzymes: No results for input(s): CKTOTAL, CKMB, CKMBINDEX, TROPONINI in the last 168 hours. BNP (last 3 results) No results for input(s): PROBNP in the last 8760 hours. HbA1C: No results for input(s): HGBA1C in the last 72 hours. CBG: Recent Labs  Lab 06/17/17 1908 06/17/17 2135 06/18/17 0019  GLUCAP 65 143* 181*   Lipid Profile: No results for input(s): CHOL, HDL, LDLCALC, TRIG, CHOLHDL, LDLDIRECT in the last 72 hours. Thyroid Function Tests: No results for input(s): TSH, T4TOTAL, FREET4, T3FREE, THYROIDAB  in the last 72 hours. Anemia Panel: No results for input(s): VITAMINB12, FOLATE, FERRITIN, TIBC, IRON, RETICCTPCT in the last 72 hours. Urine analysis:    Component Value Date/Time   COLORURINE AMBER (A) 06/17/2017 2001   APPEARANCEUR CLEAR 06/17/2017 2001   LABSPEC 1.008 06/17/2017 2001   PHURINE 5.0 06/17/2017 2001   GLUCOSEU NEGATIVE 06/17/2017 2001   HGBUR NEGATIVE 06/17/2017 2001   [place] NEGATIVE 06/17/2017 2001   KETONESUR NEGATIVE 06/17/2017 2001   PROTEINUR NEGATIVE 06/17/2017 2001   NITRITE POSITIVE (A) 06/17/2017 2001   LEUKOCYTESUR NEGATIVE 06/17/2017 2001   Sepsis Labs: @LABRCNTIP (procalcitonin:4,lacticidven:4) )No results found for this or any previous visit (from the past 240 hour(s)).   Radiological Exams on Admission: Dg Chest 2 View  Result Date: 06/17/2017 CLINICAL DATA:  Sepsis EXAM: CHEST - 2 VIEW COMPARISON:  05/18/2017 FINDINGS: No focal opacity or pleural effusion. Stable cardiomediastinal silhouette with aortic atherosclerosis. No pneumothorax. IMPRESSION: No active cardiopulmonary disease. Electronically Signed   By: Donavan Foil M.D.   On: 06/17/2017 20:53   Ct Head Wo Contrast  Result Date: 06/18/2017 CLINICAL DATA:  Altered LOC generalize weakness EXAM: CT HEAD WITHOUT CONTRAST TECHNIQUE: Contiguous axial images were obtained from the base of the skull through the vertex without intravenous contrast. COMPARISON:  MRI brain 04/24/2017, CT brain 04/23/2017 FINDINGS: Brain: No acute territorial infarction, hemorrhage or intracranial mass is visualized. Atrophy with small vessel ischemic changes of the white matter. Stable ventricle size. Vascular: No hyperdense vessels.  Carotid vascular calcification Skull: Normal. Negative for fracture or focal lesion. Sinuses/Orbits: No acute finding. Other: None IMPRESSION: 1. No CT evidence for acute intracranial abnormality. 2. Atrophy with small vessel ischemic changes of the white matter Electronically Signed   By:  Donavan Foil M.D.   On: 06/18/2017 01:44     EKG: Independently reviewed.  Artificial effects, PVC   Assessment/Plan Principal Problem:   UTI (urinary tract infection) Active Problems:   Acute metabolic encephalopathy   Diabetes mellitus without complication (HCC)   HLD (hyperlipidemia)   Sepsis (HCC)   AKI (acute kidney injury) ([place])   Essential hypertension   GERD (gastroesophageal reflux disease)   Depression with anxiety  Sepsis due to UTI (urinary tract infection): Patient meets criteria for sepsis with leukocytosis, tachycardia and tachypnea, hypotension.  Blood pressure responded to IV fluids.  Currently blood pressure is 142/74.  Lactic acid is elevated at 4.7, 2.82.  Currently hemodynamically stable.  -will admit to tele bed as inpt -IV rocephin -f/u Bx and Uc -will get Procalcitonin and trend lactic acid levels per sepsis protocol. -IVF: 2L of NS bolus in ED, followed by 75 cc/h   Acute metabolic encephalopathy:  most likely due to UTI.  CT head is negative for acute intracranial abnormalities.  No focal neurologic findings on physical admission. -Frequent neuro check -Treat UTI as above  Diabetes mellitus without complication ([place]): Last A1c not on record. Patient is taking metformin and glipizide at home.  Blood sugar 78 -SSI  HLD: -Fenofibrate and pravastatin  AKI (acute kidney injury): creatinine 1.29, BUN 25.  Likely due to UTI.  ATN is also possible due to hypotension. Also due to continuation of ACEI, diuretics. - IVF as above - Follow up renal function by BMP - Hold Cozaar and torsemide  HTN:  -Hold Cozaar and torsemide due to hypotension -IV hydralazine prn  Depression and anxiety: Stable, no suicidal or homicidal ideations. -Continue home medications: Xanax, Cymbalta  GERD: -Protonix   DVT ppx: SQ Lovenox Code Status: Full code Family Communication: Yes, patient's 2 daughters at bed side Disposition Plan:  Anticipate discharge back to  previous home environment Consults called:  none Admission status: Inpatient/tele    Date of Service 06/18/2017    Ivor Costa [facility] Pager [phone]  If 7PM-7AM, please contact night-coverage [email or web address] Password [facility] 06/18/2017, 4:34 AM

## 2017-06-17 NOTE — ED Triage Notes (Signed)
Pt arrived [facility] with reports of worsening confusion, weakness, dark urine, and frequent urination. Per EMS intial BP was 80/40. Pt given 1L NS and 4mg  zofran PTA with EMS through 18 LAC last EMS vitals were 160/10, 98%RA, P70-110 Afib with RBBB, 70CBG

## 2017-06-18 ENCOUNTER — Inpatient Hospital Stay (HOSPITAL_COMMUNITY): Payer: PPO

## 2017-06-18 ENCOUNTER — Encounter (HOSPITAL_COMMUNITY): Payer: Self-pay | Admitting: General Practice

## 2017-06-18 ENCOUNTER — Other Ambulatory Visit: Payer: Self-pay

## 2017-06-18 DIAGNOSIS — N390 Urinary tract infection, site not specified: Secondary | ICD-10-CM

## 2017-06-18 DIAGNOSIS — I10 Essential (primary) hypertension: Secondary | ICD-10-CM | POA: Diagnosis present

## 2017-06-18 DIAGNOSIS — K219 Gastro-esophageal reflux disease without esophagitis: Secondary | ICD-10-CM | POA: Diagnosis present

## 2017-06-18 DIAGNOSIS — F418 Other specified anxiety disorders: Secondary | ICD-10-CM | POA: Diagnosis present

## 2017-06-18 HISTORY — DX: Urinary tract infection, site not specified: N39.0

## 2017-06-18 LAB — CBC
HCT: 34.5 % — ABNORMAL LOW (ref 36.0–46.0)
HEMOGLOBIN: 11.3 g/dL — AB (ref 12.0–15.0)
MCH: 32.1 pg (ref 26.0–34.0)
MCHC: 32.8 g/dL (ref 30.0–36.0)
MCV: 98 fL (ref 78.0–100.0)
Platelets: 232 10*3/uL (ref 150–400)
RBC: 3.52 MIL/uL — ABNORMAL LOW (ref 3.87–5.11)
RDW: 14.2 % (ref 11.5–15.5)
WBC: 9.4 10*3/uL (ref 4.0–10.5)

## 2017-06-18 LAB — BASIC METABOLIC PANEL
ANION GAP: 9 (ref 5–15)
BUN: 21 mg/dL — AB (ref 6–20)
CALCIUM: 7.3 mg/dL — AB (ref 8.9–10.3)
CO2: 30 mmol/L (ref 22–32)
Chloride: 103 mmol/L (ref 101–111)
Creatinine, Ser: 1.02 mg/dL — ABNORMAL HIGH (ref 0.44–1.00)
GFR calc Af Amer: 59 mL/min — ABNORMAL LOW (ref >60)
GFR calc non Af Amer: 51 mL/min — ABNORMAL LOW (ref >60)
GLUCOSE: 137 mg/dL — AB (ref 65–99)
Potassium: 3.4 mmol/L — ABNORMAL LOW (ref 3.5–5.1)
Sodium: 142 mmol/L (ref 135–145)

## 2017-06-18 LAB — GLUCOSE, CAPILLARY
Glucose-Capillary: 94 mg/dL (ref 65–99)
Glucose-Capillary: 105 mg/dL — ABNORMAL HIGH (ref 65–99)

## 2017-06-18 LAB — PROCALCITONIN: Procalcitonin: 0.12 ng/mL

## 2017-06-18 LAB — LACTIC ACID, PLASMA: Lactic Acid, Venous: 1.6 mmol/L (ref 0.5–1.9)

## 2017-06-18 LAB — CBG MONITORING, ED
GLUCOSE-CAPILLARY: 181 mg/dL — AB (ref 65–99)
Glucose-Capillary: 63 mg/dL — ABNORMAL LOW (ref 65–99)
Glucose-Capillary: 120 mg/dL — ABNORMAL HIGH (ref 65–99)
Glucose-Capillary: 102 mg/dL — ABNORMAL HIGH (ref 65–99)

## 2017-06-18 LAB — BRAIN NATRIURETIC PEPTIDE: B NATRIURETIC PEPTIDE 5: 44.3 pg/mL (ref 0.0–100.0)

## 2017-06-18 MED ORDER — DEXTROSE 50 % IV SOLN: 50.0000 mL | INTRAVENOUS | Status: DC | PRN

## 2017-06-18 MED ORDER — SODIUM CHLORIDE 0.9 % IV SOLN: 1.0000 g | Freq: Two times a day (BID) | INTRAVENOUS | Status: DC

## 2017-06-18 MED ORDER — SODIUM CHLORIDE 0.9 % IV SOLN
2.0000 g | INTRAVENOUS | Status: DC
  Administered 2017-06-18 – 2017-06-19 (×2): 2 g via INTRAVENOUS
  Filled 2017-06-18 (×2): qty 20

## 2017-06-18 MED ORDER — POTASSIUM CHLORIDE CRYS ER 20 MEQ PO TBCR
20.0000 meq | EXTENDED_RELEASE_TABLET | Freq: Once | ORAL | Status: AC
  Administered 2017-06-18: 20 meq via ORAL
  Filled 2017-06-18: qty 1

## 2017-06-18 MED ORDER — INSULIN ASPART 100 UNIT/ML ~~LOC~~ SOLN: 0.0000 [IU] | Freq: Every day | SUBCUTANEOUS | Status: DC

## 2017-06-18 MED ORDER — SODIUM CHLORIDE 0.9 % IV BOLUS
1000.0000 mL | Freq: Once | INTRAVENOUS | Status: AC
  Administered 2017-06-18: 1000 mL via INTRAVENOUS

## 2017-06-18 MED ORDER — BACID PO TABS
2.0000 | ORAL_TABLET | Freq: Three times a day (TID) | ORAL | Status: DC
  Administered 2017-06-18 – 2017-06-20 (×7): 2 via ORAL
  Filled 2017-06-18 (×9): qty 2

## 2017-06-18 MED ORDER — INSULIN ASPART 100 UNIT/ML ~~LOC~~ SOLN: 0.0000 [IU] | Freq: Three times a day (TID) | SUBCUTANEOUS | Status: DC

## 2017-06-18 NOTE — Progress Notes (Signed)
[facility]  Patient Status: Active (receiving services up to time of hospitalization)  [facility] is providing the following services: PT and OT  If patient discharges after hours, please call [phone].   Melissa Dalton 06/18/2017, 5:17 PM

## 2017-06-18 NOTE — Progress Notes (Signed)
Received report on pt.

## 2017-06-18 NOTE — ED Notes (Signed)
Pt eating her breakfast tray.

## 2017-06-18 NOTE — ED Notes (Signed)
CBG 181.  

## 2017-06-18 NOTE — Progress Notes (Signed)
Melissa Dalton is a 83 y.o. female patient admitted from ED awake, alert - oriented  X 4 - no acute distress noted.  VSS - Blood pressure 109/62, pulse 82, temperature 98.2 F (36.8 C), temperature source Oral, resp. rate 20, height 5\' 1"  (1.549 m), weight 109.8 kg (242 lb), SpO2 96 %.    IV in place, occlusive dsg intact without redness.  Orientation to room, and floor completed with information packet given to patient/family.  Patient declined safety video at this time.  Admission INP armband ID verified with patient/family, and in place.   SR up x 2, fall assessment complete, with patient and family able to verbalize understanding of risk associated with falls, and verbalized understanding to call nsg before up out of bed.  Call light within reach, patient able to voice, and demonstrate understanding.  Skin, clean-dry- intact without evidence of bruising, or skin tears.   No evidence of skin break down noted on exam.     Will cont to eval and treat per MD orders.  Celine Ahr, RN 06/18/2017 4:33 PM

## 2017-06-18 NOTE — ED Notes (Signed)
Pt CBG 63. Pt awake and talking, given juice and PO intake. Will recheck CBG.

## 2017-06-18 NOTE — Progress Notes (Signed)
PROGRESS NOTE    Patient: Melissa Dalton     PCP: Chesley Noon, MD                    DOB: [birth year]            DOA: 06/17/2017 NWG:[id number]             DOS: 06/18/2017, 12:27 PM   LOS: 1 day   Date of Service: The patient was seen and examined on 06/18/2017  Subjective:  She was seen and examined this morning, was awake alert was able to hold conversation, follow command. Mentation seems to be at baseline.  Daughter present at bedside. Patient and the daughter reports that she has difficulty moving, standing on her legs or ambulating. Daughter reports she needs assist with all ADLs at home.  Very difficult to move forward especially for bathroom use is.  She already has developed unstageable sacral decub. Once she was stable this morning not complaining of any pain. Patient has much improved since admission.  ----------------------------------------------------------------------------------------------------------------------  Brief Narrative:  Melissa Dalton is a 83 y.o. female with medical history significant of hypertension, hyperlipidemia, diabetes mellitus, GERD, depression, anxiety, who presents with dysuria, burning on urination, increased urinary frequency and altered mental status.  Currently she was found septic due to UTI, hypotensive, tachycardic, hypotensive, mental status. Daughters report at baseline she ambulates with assist.  Progressively gotten weaker over for last couple weeks.  No history of stroke or MIs.   Principal Problem:   UTI (urinary tract infection) Active Problems:   Acute metabolic encephalopathy   Diabetes mellitus without complication (HCC)   HLD (hyperlipidemia)   Sepsis (HCC)   AKI (acute kidney injury) ([place])   Essential hypertension   GERD (gastroesophageal reflux disease)   Depression with anxiety   Assessment & Plan:   Sepsis due to UTI (urinary tract infection):  -Admit the initial criteria for sepsis,  leukocytosis/tachycardia/tachypnea/hypotension -Symptoms is much improved, mildly low blood pressure still, another liter of NS will be given -Lactic acid is improved from 4.7-0.6, -3 IV antibiotics Rocephin -Follow the blood and urine cultures -Overall stable, awake  Acute metabolic encephalopathy:  -Exacerbated by sepsis, UTI -Mentation has much improved, at baseline  Diabetes mellitus without complication ([place]):  -She is blood sugars running low, holding metformin and glipizide -Continue SSI, checking blood sugar q. AC/at bedtime   HLD: -Fenofibrate and pravastatin  AKI (acute kidney injury):  -ATN, exacerbated by hypotension -BUN/creatinine 25/1.29 on admission, improved to 21/1.02 -To need to hold blood pressure medication including ACE inhibitors (Cozaar) -Holding torsemide  HTN:  -Hold Cozaar and torsemide due to hypotension -IV hydralazine prn  Depression and anxiety:  -Stable, no suicidal or homicidal ideations. -Continue home medications: Xanax, Cymbalta  GERD: -Protonix  Severe debility -Per patient and daughter she has difficulty ambulating at baseline, she is progressively has gotten worse now.  Unable to stand. -PT/OT will be consulted for evaluation and recommendations  Unstageable sacral decubitus -Consult wound care for further evaluation recommendation.   DVT ppx: SQ Lovenox Code Status: Full code Family Communication: Yes, patient's daughter present at bedside Boarding since her father, patient's husband died, condition has gotten worse, reporting at baseline unable to ambulate.   Disposition Plan:  Anticipate discharge  To SNF Consults called:  none Admission status: Inpatient/tele      Consultants:   PT/ OT   Procedures:  No admission procedures for hospital encounter.   Antimicrobials:  Anti-infectives (From admission,  onward)   Start     Dose/Rate Route Frequency Ordered Stop   06/18/17 2100  cefTRIAXone (ROCEPHIN) 2 g  in sodium chloride 0.9 % 100 mL IVPB     2 g 200 mL/hr over 30 Minutes Intravenous Every 24 hours 06/18/17 0921     06/18/17 2000  cefTRIAXone (ROCEPHIN) 1 g in sodium chloride 0.9 % 100 mL IVPB  Status:  Discontinued     1 g 200 mL/hr over 30 Minutes Intravenous Every 24 hours 06/17/17 2310 06/18/17 0728   06/18/17 1000  cefTRIAXone (ROCEPHIN) 1 g in sodium chloride 0.9 % 100 mL IVPB  Status:  Discontinued     1 g 200 mL/hr over 30 Minutes Intravenous Every 12 hours 06/18/17 0728 06/18/17 0921   06/17/17 2045  cefTRIAXone (ROCEPHIN) 1 g in sodium chloride 0.9 % 100 mL IVPB     1 g 200 mL/hr over 30 Minutes Intravenous  Once 06/17/17 2042 06/17/17 2130       Objective: Vitals:   06/18/17 0900 06/18/17 0952 06/18/17 0955 06/18/17 1001  BP: (!) 95/49 (!) 88/60 (!) 98/43 (!) 98/49  Pulse: 78 88 81 68  Resp: 19 (!) 27 20 18   Temp:      TempSrc:      SpO2: 99% 100% 99% 97%  Weight:      Height:        Intake/Output Summary (Last 24 hours) at 06/18/2017 1227 Last data filed at 06/18/2017 0849 Gross per 24 hour  Intake 3049.75 ml  Output -  Net 3049.75 ml   Filed Weights   06/17/17 1901  Weight: 109.8 kg (242 lb)    Examination:  General exam: Appears calm and comfortable  Psychiatry: Judgement and insight appear normal. Mood & affect appropriate. HEENT: WNLs Respiratory system: Clear to auscultation. Respiratory effort normal. Cardiovascular system: S1 & S2 heard, RRR. No JVD, murmurs, rubs, gallops or clicks. No pedal edema. Gastrointestinal system: Abd. nondistended, soft and nontender. No organomegaly or masses felt. Normal bowel sounds heard. Central nervous system: Alert and oriented. No focal neurological deficits. Extremities: Generalized weakness is noted, able to move her legs in bed but per patient and daughter she is unable to ambulate without assist  Skin: No rashes, lesions or ulcers Wounds: Per patient and daughter present on admission was a pressure ulcer  unstageable Please see and , Per nursing documentation (patient was not comfortable by me moving her by myself)   Data Reviewed: I have personally reviewed following labs and imaging studies  CBC: Recent Labs  Lab 06/17/17 1919 06/18/17 0441  WBC 10.9* 9.4  NEUTROABS 8.0*  --   HGB 13.4 11.3*  HCT 40.6 34.5*  MCV 97.8 98.0  PLT 305 086   Basic Metabolic Panel: Recent Labs  Lab 06/17/17 1919 06/18/17 0441  NA 145 142  K 3.9 3.4*  CL 99* 103  CO2 28 30  GLUCOSE 78 137*  BUN 25* 21*  CREATININE 1.29* 1.02*  CALCIUM 8.4* 7.3*   GFR: Estimated Creatinine Clearance: 50.4 mL/min (A) (by C-G formula based on SCr of 1.02 mg/dL (H)). Liver Function Tests: Recent Labs  Lab 06/17/17 1919  AST 34  ALT 20  ALKPHOS 42  BILITOT 0.8  PROT 6.7  ALBUMIN 3.3*  Coagulation Profile: Recent Labs  Lab 06/17/17 1919  INR 1.08  CBG: Recent Labs  Lab 06/17/17 1908 06/17/17 2135 06/18/17 0019 06/18/17 0803 06/18/17 0952  GLUCAP 65 143* 181* 63* 120*   LiSepsis Labs: Recent Labs  Lab 06/17/17 1930 06/17/17 2109 06/17/17 2345  PROCALCITON  --   --  0.12  LATICACIDVEN 4.72* 2.82* 1.6    No results found for this or any previous visit (from the past 240 hour(s)).    Radiology Studies: Dg Chest 2 View  Result Date: 06/17/2017 CLINICAL DATA:  Sepsis EXAM: CHEST - 2 VIEW COMPARISON:  05/18/2017 FINDINGS: No focal opacity or pleural effusion. Stable cardiomediastinal silhouette with aortic atherosclerosis. No pneumothorax. IMPRESSION: No active cardiopulmonary disease. Electronically Signed   By: Donavan Foil M.D.   On: 06/17/2017 20:53   Ct Head Wo Contrast  Result Date: 06/18/2017 CLINICAL DATA:  Altered LOC generalize weakness EXAM: CT HEAD WITHOUT CONTRAST TECHNIQUE: Contiguous axial images were obtained from the base of the skull through the vertex without intravenous contrast. COMPARISON:  MRI brain 04/24/2017, CT brain 04/23/2017 FINDINGS: Brain: No acute territorial  infarction, hemorrhage or intracranial mass is visualized. Atrophy with small vessel ischemic changes of the white matter. Stable ventricle size. Vascular: No hyperdense vessels.  Carotid vascular calcification Skull: Normal. Negative for fracture or focal lesion. Sinuses/Orbits: No acute finding. Other: None IMPRESSION: 1. No CT evidence for acute intracranial abnormality. 2. Atrophy with small vessel ischemic changes of the white matter Electronically Signed   By: Donavan Foil M.D.   On: 06/18/2017 01:44    Scheduled Meds: . aspirin  325 mg Oral Daily  . cholecalciferol  1,000 Units Oral Daily  . DULoxetine  30 mg Oral Daily  . enoxaparin (LOVENOX) injection  40 mg Subcutaneous Daily  . famotidine  40 mg Oral QHS  . gabapentin  300 mg Oral Daily   And  . gabapentin  600 mg Oral QHS  . insulin aspart  0-5 Units Subcutaneous QHS  . insulin aspart  0-9 Units Subcutaneous TID WC  . lactobacillus acidophilus  2 tablet Oral TID  . loratadine  10 mg Oral Daily  . pravastatin  40 mg Oral Daily   Continuous Infusions: . sodium chloride 75 mL/hr at 06/17/17 2326  . cefTRIAXone (ROCEPHIN)  IV      Time spent: >25 minutes  Deatra James, MD [facility],  Pager [phone]  If 7PM-7AM, please contact night-coverage [email or web address]   Password [facility]  06/18/2017, 12:27 PM

## 2017-06-18 NOTE — ED Notes (Signed)
Patient transported to CT 

## 2017-06-18 NOTE — Consult Note (Signed)
[place] Nurse wound consult note Reason for Consult: "sacral decubitus" Patient's sacral/coccyx/buttock area assessed.  There is no wound, not even a stage 1 PI, no MASD.  The patient states she sits in one position most of every day.  She is incontinent of urine; a Purewick is currently in use.  The gluteal folds are dark pink in color, but everything blanches and epithelial tissue is intact.  I have explained to her the importance of turning and repositioning.  She can turn herself side-to-side although it does take some effort.  I have explained how staying in one position too long can contribute to pressure injuries.  She verbalized her understanding.  I have entered an order for an air mattress to help with pressure relief and drying the skin, both of which should assist in reducing the likelihood of PI development.  Thank you for the consult.  Discussed plan of care with the patient and bedside nurse.  [place] nurse will not follow at this time.  Please re-consult the [place] team if needed.  Val Riles, RN, MSN, CWOCN, CNS-BC, pager [phone]

## 2017-06-18 NOTE — ED Notes (Signed)
Admitting MD updated that pt remains hypotensive after receiving bolus. CBGS improved after oral intake as well.

## 2017-06-18 NOTE — ED Notes (Signed)
MD text paged:  Greig Castilla Room [id number] patient BP 87/57 Map 67. Patient has fluids going at 75 ML/hour.   Carmelina Paddock RN

## 2017-06-18 NOTE — ED Notes (Signed)
Breakfast tray ordered 

## 2017-06-19 DIAGNOSIS — K210 Gastro-esophageal reflux disease with esophagitis: Secondary | ICD-10-CM

## 2017-06-19 DIAGNOSIS — I10 Essential (primary) hypertension: Secondary | ICD-10-CM

## 2017-06-19 LAB — BASIC METABOLIC PANEL
Anion gap: 9 (ref 5–15)
BUN: 16 mg/dL (ref 6–20)
CHLORIDE: 107 mmol/L (ref 101–111)
CO2: 27 mmol/L (ref 22–32)
CREATININE: 0.77 mg/dL (ref 0.44–1.00)
Calcium: 6.9 mg/dL — ABNORMAL LOW (ref 8.9–10.3)
GFR calc Af Amer: >60 mL/min (ref >60)
GFR calc non Af Amer: >60 mL/min (ref >60)
GLUCOSE: 93 mg/dL (ref 65–99)
POTASSIUM: 3.2 mmol/L — AB (ref 3.5–5.1)
Sodium: 143 mmol/L (ref 135–145)

## 2017-06-19 LAB — CBC
HCT: 31.4 % — ABNORMAL LOW (ref 36.0–46.0)
HEMOGLOBIN: 10.2 g/dL — AB (ref 12.0–15.0)
MCH: 32.8 pg (ref 26.0–34.0)
MCHC: 32.5 g/dL (ref 30.0–36.0)
MCV: 101 fL — AB (ref 78.0–100.0)
Platelets: 197 10*3/uL (ref 150–400)
RBC: 3.11 MIL/uL — AB (ref 3.87–5.11)
RDW: 14.1 % (ref 11.5–15.5)
WBC: 6.6 10*3/uL (ref 4.0–10.5)

## 2017-06-19 LAB — GLUCOSE, CAPILLARY
GLUCOSE-CAPILLARY: 121 mg/dL — AB (ref 65–99)
Glucose-Capillary: 88 mg/dL (ref 65–99)
Glucose-Capillary: 107 mg/dL — ABNORMAL HIGH (ref 65–99)
Glucose-Capillary: 120 mg/dL — ABNORMAL HIGH (ref 65–99)

## 2017-06-19 NOTE — Progress Notes (Signed)
PROGRESS NOTE    Patient: Melissa Dalton     PCP: Chesley Noon, MD                    DOB: 04-Sep-1934            DOA: 06/17/2017 FUX:[id number]             DOS: 06/19/2017, 12:55 PM   LOS: 2 days   Date of Service: The patient was seen and examined on 06/19/2017  Subjective:  Patient was seen and examined this morning, stable mentation has much improved. Still complaining of generalized weaknesses. The patient was successfully assessed by wound care team as she was moved in bed, no pressure ulcers were identified. We have informed the patient that even in bed she needs to move from side to side if the nurses unable to move her every 2 hours.  Patient's daughter was not present at bedside. She still agreeable to rehab/SNF placement, according to the social worker she has a co-pay $160 per day.  Not sure if patient can afford it. ----------------------------------------------------------------------------------------------------------------------  Brief Narrative:  Melissa Dalton is a 83 y.o. female with medical history significant of hypertension, hyperlipidemia, diabetes mellitus, GERD, depression, anxiety, who presents with dysuria, burning on urination, increased urinary frequency and altered mental status.  Currently she was found septic due to UTI, hypotensive, tachycardic, hypotensive, mental status. Daughters report at baseline she ambulates with assist.  Progressively gotten weaker over for last couple weeks.  No history of stroke or MIs.   Principal Problem:   UTI (urinary tract infection) Active Problems:   Acute metabolic encephalopathy   Diabetes mellitus without complication (HCC)   HLD (hyperlipidemia)   Sepsis ([place])   AKI (acute kidney injury) ([place])   Essential hypertension   GERD (gastroesophageal reflux disease)   Depression with anxiety   Assessment & Plan:   Sepsis due to UTI (urinary tract infection):  -On admission met the sepsis criteria, patient  has been treated aggressively per sepsis protocol, much improved -Urine culture growing greater than 100,000 colonies of Klebsiella, pending sensitivity -Pending blood cultures, negative to date -We will continue IV antibiotics Rocephin. -Currently stable, afebrile, normotensive, improved leukocytosis  -Lactic acid is improved from 4.7--->0.6,    Acute metabolic encephalopathy:  -Back to baseline, exacerbated by infection, sepsis as above.   Diabetes mellitus without complication ([place]):  -She is blood sugars running low, holding metformin and glipizide -Continue SSI, checking blood sugar q. AC/at bedtime   HLD: -Fenofibrate and pravastatin  AKI (acute kidney injury):  -ATN, exacerbated by hypotension -BUN/creatinine 25/1.29 on admission, improved to 21/1.02 -To need to hold blood pressure medication including ACE inhibitors (Cozaar) -Holding torsemide  HTN:  -Hold Cozaar and torsemide due to hypotension -IV hydralazine prn  Depression and anxiety:  -Stable, no suicidal or homicidal ideations. -Continue home medications: Xanax, Cymbalta  GERD: -Protonix  Severe debility -Per family patient is unable to ambulate or carry her ADLs -PT OT has recommended SNF, social worker reporting patient was on [facility] and her co-pay is $160 per day -but unsure if patient can afford it. -We will discuss with her daughter, as for now we will presume with home health arrangement.   Unstageable sacral decubitus -Wound care team was consulted, patient has been thoroughly assessed no open wounds were identified.   DVT ppx: SQ Lovenox Code Status: Full code Family Communication: Yes, patient's daughter present at bedside Boarding since her father, patient's husband died, condition  has gotten worse, reporting at baseline unable to ambulate.   Disposition Plan:  Anticipate discharge  To SNF Consults called:  none Admission status: Inpatient/tele      Consultants:     PT/ OT   Procedures:  No admission procedures for hospital encounter.   Antimicrobials:  Anti-infectives (From admission, onward)   Start     Dose/Rate Route Frequency Ordered Stop   06/18/17 2100  cefTRIAXone (ROCEPHIN) 2 g in sodium chloride 0.9 % 100 mL IVPB     2 g 200 mL/hr over 30 Minutes Intravenous Every 24 hours 06/18/17 0921     06/18/17 2000  cefTRIAXone (ROCEPHIN) 1 g in sodium chloride 0.9 % 100 mL IVPB  Status:  Discontinued     1 g 200 mL/hr over 30 Minutes Intravenous Every 24 hours 06/17/17 2310 06/18/17 0728   06/18/17 1000  cefTRIAXone (ROCEPHIN) 1 g in sodium chloride 0.9 % 100 mL IVPB  Status:  Discontinued     1 g 200 mL/hr over 30 Minutes Intravenous Every 12 hours 06/18/17 0728 06/18/17 0921   06/17/17 2045  cefTRIAXone (ROCEPHIN) 1 g in sodium chloride 0.9 % 100 mL IVPB     1 g 200 mL/hr over 30 Minutes Intravenous  Once 06/17/17 2042 06/17/17 2130       Objective: Vitals:   06/18/17 1629 06/18/17 2209 06/19/17 0559 06/19/17 0618  BP: 109/62 (!) 112/53 (!) 113/47 100/60  Pulse: 82 77 66   Resp: '20 17 17   ' Temp: 98.2 F (36.8 C) 98.2 F (36.8 C) 97.6 F (36.4 C)   TempSrc: Oral Oral Oral   SpO2: 96% 100% 98%   Weight:      Height:        Intake/Output Summary (Last 24 hours) at 06/19/2017 1255 Last data filed at 06/19/2017 0943 Gross per 24 hour  Intake 1280 ml  Output 502 ml  Net 778 ml   Filed Weights   06/17/17 1901  Weight: 109.8 kg (242 lb)    Examination:  General exam: Appears calm and comfortable  Psychiatry: Judgement and insight appear normal. Mood & affect appropriate. HEENT: WNLs Respiratory system: Clear to auscultation. Respiratory effort normal. Cardiovascular system: S1 & S2 heard, RRR. No JVD, murmurs, rubs, gallops or clicks. No pedal edema. Gastrointestinal system: Abd. nondistended, soft and nontender. No organomegaly or masses felt. Normal bowel sounds heard. Central nervous system: Alert and oriented. No  focal neurological deficits. Extremities: Generalized weakness is noted, able to move her legs in bed but per patient and daughter she is unable to ambulate without assist  Skin: No rashes, lesions or ulcers Wounds: Per patient and daughter present on admission was a pressure ulcer unstageable Per nursing notes/wound care team's note patient does not have any open wound at this time.  Data Reviewed: I have personally reviewed following labs and imaging studies  CBC: Recent Labs  Lab 06/17/17 1919 06/18/17 0441 06/19/17 0356  WBC 10.9* 9.4 6.6  NEUTROABS 8.0*  --   --   HGB 13.4 11.3* 10.2*  HCT 40.6 34.5* 31.4*  MCV 97.8 98.0 101.0*  PLT 305 232 638   Basic Metabolic Panel: Recent Labs  Lab 06/17/17 1919 06/18/17 0441 06/19/17 0356  NA 145 142 143  K 3.9 3.4* 3.2*  CL 99* 103 107  CO2 '28 30 27  ' GLUCOSE 78 137* 93  BUN 25* 21* 16  CREATININE 1.29* 1.02* 0.77  CALCIUM 8.4* 7.3* 6.9*   GFR: Estimated Creatinine Clearance: 64.3 mL/min (  by C-G formula based on SCr of 0.77 mg/dL). Liver Function Tests: Recent Labs  Lab 06/17/17 1919  AST 34  ALT 20  ALKPHOS 42  BILITOT 0.8  PROT 6.7  ALBUMIN 3.3*  Coagulation Profile: Recent Labs  Lab 06/17/17 1919  INR 1.08  CBG: Recent Labs  Lab 06/18/17 1448 06/18/17 1652 06/18/17 2209 06/19/17 0802 06/19/17 1155  GLUCAP 102* 94 105* 88 107*   LiSepsis Labs: Recent Labs  Lab 06/17/17 1930 06/17/17 2109 06/17/17 2345  PROCALCITON  --   --  0.12  LATICACIDVEN 4.72* 2.82* 1.6    Recent Results (from the past 240 hour(s))  Urine culture     Status: Abnormal (Preliminary result)   Collection Time: 06/17/17  7:23 PM  Result Value Ref Range Status   Specimen Description URINE, CATHETERIZED  Final   Special Requests   Final    Normal Performed at [facility], [place] [address] [place], [place] [address]    Culture >=100,000 COLONIES/mL KLEBSIELLA ORNITHINOLYTICA (A)  Final   Report Status PENDING   Incomplete  Culture, blood (Routine x 2)     Status: None (Preliminary result)   Collection Time: 06/17/17  7:44 PM  Result Value Ref Range Status   Specimen Description BLOOD RIGHT ANTECUBITAL  Final   Special Requests   Final    BOTTLES DRAWN AEROBIC AND ANAEROBIC Blood Culture adequate volume   Culture   Final    NO GROWTH 2 DAYS Performed at [facility], [place] [address], [place], [place] [address]    Report Status PENDING  Incomplete  Culture, blood (Routine x 2)     Status: None (Preliminary result)   Collection Time: 06/17/17  7:50 PM  Result Value Ref Range Status   Specimen Description BLOOD LEFT ARM  Final   Special Requests   Final    BOTTLES DRAWN AEROBIC AND ANAEROBIC Blood Culture adequate volume   Culture   Final    NO GROWTH 2 DAYS Performed at [facility], [address], [place], [place] [address]    Report Status PENDING  Incomplete      Radiology Studies: Dg Chest 2 View  Result Date: 06/17/2017 CLINICAL DATA:  Sepsis EXAM: CHEST - 2 VIEW COMPARISON:  05/18/2017 FINDINGS: No focal opacity or pleural effusion. Stable cardiomediastinal silhouette with aortic atherosclerosis. No pneumothorax. IMPRESSION: No active cardiopulmonary disease. Electronically Signed   By: Donavan Foil M.D.   On: 06/17/2017 20:53   Ct Head Wo Contrast  Result Date: 06/18/2017 CLINICAL DATA:  Altered LOC generalize weakness EXAM: CT HEAD WITHOUT CONTRAST TECHNIQUE: Contiguous axial images were obtained from the base of the skull through the vertex without intravenous contrast. COMPARISON:  MRI brain 04/24/2017, CT brain 04/23/2017 FINDINGS: Brain: No acute territorial infarction, hemorrhage or intracranial mass is visualized. Atrophy with small vessel ischemic changes of the white matter. Stable ventricle size. Vascular: No hyperdense vessels.  Carotid vascular calcification Skull: Normal. Negative for fracture or focal lesion. Sinuses/Orbits: No acute finding. Other: None  IMPRESSION: 1. No CT evidence for acute intracranial abnormality. 2. Atrophy with small vessel ischemic changes of the white matter Electronically Signed   By: Donavan Foil M.D.   On: 06/18/2017 01:44    Scheduled Meds: . aspirin  325 mg Oral Daily  . cholecalciferol  1,000 Units Oral Daily  . DULoxetine  30 mg Oral Daily  . enoxaparin (LOVENOX) injection  40 mg Subcutaneous Daily  . famotidine  40 mg  Oral QHS  . gabapentin  300 mg Oral Daily   And  . gabapentin  600 mg Oral QHS  . insulin aspart  0-5 Units Subcutaneous QHS  . insulin aspart  0-9 Units Subcutaneous TID WC  . lactobacillus acidophilus  2 tablet Oral TID  . loratadine  10 mg Oral Daily  . pravastatin  40 mg Oral Daily   Continuous Infusions: . sodium chloride 50 mL/hr at 06/19/17 0803  . cefTRIAXone (ROCEPHIN)  IV Stopped (06/18/17 2135)    Time spent: >25 minutes  Deatra James, MD [facility],  Pager [phone]  If 7PM-7AM, please contact night-coverage [email or web address]   Password [facility]  06/19/2017, 12:55 PM

## 2017-06-19 NOTE — Progress Notes (Signed)
Occupational Therapy Evaluation Patient Details Name: Melissa Dalton MRN: [id number] DOB: 08/22/1934 Today's Date: 06/19/2017    History of Present Illness 83yo female having persistent weakness and confusion, also with history of trip to ED one day earlier due to cough, fever, weakness. She was found to be covered in feces at home by home health, and family reports history of hallucinations. CT of head negative for acute changes. Diagnosed with acute encephalopathy of unknown origin, possible pneumonia. PMH OA, DM, HTN, hernia repair    Clinical Impression   PTA, patient was living at home, independent in ADL's but received assistance from her daughter for meal prep and housekeeping. Per daughter report, pt's participation in daily activities has declined since pt's husband's death 2 weeks ago. During session, pt was withdrawn, made poor eye contact and had a flat affect. Pt required min-modA+2 for safety/supervision for stand pivot transfer to [facility] using RW; Pt required minA for upper body dressing and modA for lower body dressing. Pt currently presents with generalized weakness and limited activity tolerance.  Pt eager to return home; Will continue to follow in acute setting. Due to deficits client will benefit from continued skilled OT services at SNF to increase her independence and safety with ADL's/IADL's.  Follow Up Recommendations  SNF;Supervision/Assistance - 24 hour    Equipment Recommendations  3 in 1 bedside commode    Recommendations for Other Services       Precautions / Restrictions Precautions Precautions: Fall Restrictions Weight Bearing Restrictions: No      Mobility Bed Mobility Overal bed mobility: Modified Independent Bed Mobility: Supine to Sit     Supine to sit: HOB elevated;Modified independent (Device/Increase time)       Transfers Overall transfer level: Needs assistance Equipment used: Rolling walker (2 wheeled) Transfers: Sit to/from [employer] Sit to Stand: Mod assist;+2 safety/equipment Stand pivot transfers: Mod assist;From elevated surface;+2 safety/equipment         Balance Overall balance assessment: Needs assistance Sitting-balance support: Feet supported Sitting balance-Leahy Scale: Good     Standing balance support: Bilateral upper extremity supported;During functional activity Standing balance-Leahy Scale: Poor                            ADL either performed or assessed with clinical judgement   ADL Overall ADL's : Needs assistance/impaired Eating/Feeding: Set up Eating/Feeding Details (indicate cue type and reason): Meal prep limited by pt's generalized weakness. Grooming: Minimal assistance;Sitting;Bed level   Upper Body Bathing: Minimal assistance;Sitting;Bed level   Lower Body Bathing: Minimal assistance;Bed level   Upper Body Dressing : Sitting;Bed level;Minimal assistance   Lower Body Dressing: Moderate assistance;Sitting/lateral leans;Bed level   Toilet Transfer: Moderate assistance;RW;BSC;+2 for safety/equipment   Toileting- Clothing Manipulation and Hygiene: Min guard Toileting - Clothing Manipulation Details (indicate cue type and reason): Pt completed toileting on BSC, sit>stand for pericare, +2 for safety     Functional mobility during ADLs: Moderate assistance;+2 for safety/equipment;Rolling walker;Cueing for safety General ADL Comments: Per daughter, pt has not been walking around at home to complete ADLs.      Vision Baseline Vision/History: Wears glasses Wears Glasses: At all times Patient Visual Report: No change from baseline       Perception     Praxis      Pertinent Vitals/Pain Pain Assessment: Faces Pain Score: 4  Faces Pain Scale: Hurts little more Pain Location: L hip  Pain Descriptors / Indicators: Sore;Tender Pain Intervention(s): Limited  activity within patient's tolerance;Monitored during session     Hand Dominance Right    Extremity/Trunk Assessment Upper Extremity Assessment Upper Extremity Assessment: Generalized weakness;   Lower Extremity Assessment Lower Extremity Assessment: Generalized weakness;Defer to PT evaluation   Cervical / Trunk Assessment Cervical / Trunk Assessment: Kyphotic   Communication Communication Communication: No difficulties   Cognition Arousal/Alertness: Awake/alert Behavior During Therapy: Anxious;Flat affect Overall Cognitive Status: Difficult to assess                                 General Comments: daughter confirming pt's responses to PLOF   General Comments  Pt grieving husbands death as demonstrated by disengagement during session (flat affect and poor eye contact). Pt stated she "just wants to go home". Per daughter report, pt's participation in daily activities has been declining since death of pt's husband.     Exercises     Shoulder Instructions      Home Living Family/patient expects to be discharged to:: Private residence Living Arrangements: Alone Available Help at Discharge: Family Type of Home: House Home Access: Stairs to enter Technical brewer of Steps: 2-3 Entrance Stairs-Rails: None Home Layout: One level     Bathroom Shower/Tub: Other (comment)(Patient sponge bathes)   Bathroom Toilet: Standard Bathroom Accessibility: Yes How Accessible: Accessible via walker Home Equipment: Walker - 2 wheels          Prior Functioning/Environment Level of Independence: Needs assistance  Gait / Transfers Assistance Needed: walks with a walker, daughter reports very little activity since her husband's death 2 weeks ago, walking only a few steps  ADL's / Homemaking Assistance Needed: can perform ADL independently, does laundry, dependent in meal prep and housekeeping   Comments: Pt's husband passed away 2 weeks ago.         OT Problem List: Decreased strength;Decreased activity tolerance;Impaired balance (sitting and/or  standing);Pain      OT Treatment/Interventions: Self-care/ADL training;DME and/or AE instruction;Therapeutic activities;Patient/family education;Balance training;Cognitive remediation/compensation    OT Goals(Current goals can be found in the care plan section) Acute Rehab OT Goals Patient Stated Goal: to go home OT Goal Formulation: With patient Time For Goal Achievement: 07/03/17 Potential to Achieve Goals: Good  OT Frequency: Min 2X/week   Barriers to D/C:            Co-evaluation PT/OT/SLP Co-Evaluation/Treatment: Yes Reason for Co-Treatment: For patient/therapist safety PT goals addressed during session: Mobility/safety with mobility;Proper use of DME OT goals addressed during session: ADL's and self-care      AM-PAC PT "6 Clicks" Daily Activity     Outcome Measure Help from another person eating meals?: None Help from another person taking care of personal grooming?: A Little Help from another person toileting, which includes using toliet, bedpan, or urinal?: A Little Help from another person bathing (including washing, rinsing, drying)?: A Little Help from another person to put on and taking off regular upper body clothing?: A Little Help from another person to put on and taking off regular lower body clothing?: A Lot 6 Click Score: 18   End of Session Equipment Utilized During Treatment: Gait belt;Rolling walker  Activity Tolerance: Patient limited by pain Patient left: in chair;with call bell/phone within reach;with chair alarm set;with family/visitor present  OT Visit Diagnosis: Muscle weakness (generalized) (M62.81);Pain;Other symptoms and signs involving cognitive function Pain - Right/Left: Left Pain - part of body: Hip  Time: 1235-1300 OT Time Calculation (min): 25 min Charges:  OT General Charges $OT Visit: 1 Visit OT Evaluation $OT Eval Moderate Complexity: 1 Mod G-Codes:     Dorinda Hill OTS    06/19/2017, 4:13 PM

## 2017-06-19 NOTE — Evaluation (Signed)
Physical Therapy Evaluation Patient Details Name: Melissa Dalton MRN: [id number] DOB: Jan 06, 1935 Today's Date: 06/19/2017   History of Present Illness  83yo female having persistent weakness and confusion, also with history of trip to ED one day earlier due to cough, fever, weakness. She was found to be covered in feces at home by home health, and family reports history of hallucinations. CT of head negative for acute changes. Diagnosed with acute encephalopathy of unknown origin, possible pneumonia. PMH OA, DM, HTN, hernia repair   Clinical Impression  Pt admitted with above diagnosis. Pt currently with functional limitations due to the deficits listed below (see PT Problem List). Pt deconditioned, requiring assist for transfers, short distance amb and with limited home support since her husband passed away a few weeks ago; Pt is reluctantly agreeable to rehab/SNF; will continue to follow in acute setting;  Pt will benefit from skilled PT to increase their independence and safety with mobility to allow discharge to the venue listed below.       Follow Up Recommendations SNF    Equipment Recommendations  None recommended by PT    Recommendations for Other Services       Precautions / Restrictions Precautions Precautions: Fall Restrictions Weight Bearing Restrictions: No      Mobility  Bed Mobility Overal bed mobility: Modified Independent Bed Mobility: Supine to Sit     Supine to sit: HOB elevated;Modified independent (Device/Increase time)     General bed mobility comments: incr time, no physical assist needed  Transfers Overall transfer level: Needs assistance Equipment used: Rolling walker (2 wheeled) Transfers: Sit to/from [facility] Sit to Stand: Mod assist;+2 safety/equipment Stand pivot transfers: Mod assist;From elevated surface;+2 safety/equipment       General transfer comment: pt requires +2 to boost into standing from EOB and to steady at  RW; minA+2 to take pivotal steps to recliner   Ambulation/Gait Ambulation/Gait assistance: Min assist;+2 physical assistance;+2 safety/equipment Ambulation Distance (Feet): 4 Feet Assistive device: Rolling walker (2 wheeled) Gait Pattern/deviations: Step-to pattern;Decreased step length - right;Decreased step length - left;Trunk flexed     General Gait Details: pt was able to take steps from [facility] to recliner, cues for RW safety  Stairs            Wheelchair Mobility    Modified Rankin (Stroke Patients Only)       Balance Overall balance assessment: Needs assistance Sitting-balance support: Feet supported Sitting balance-Leahy Scale: Good Sitting balance - Comments: pt completed toileting on BSC with supervision   Standing balance support: Bilateral upper extremity supported;During functional activity Standing balance-Leahy Scale: Poor Standing balance comment: reliant on UEs                             Pertinent Vitals/Pain Pain Assessment: Faces Pain Score: 4  Faces Pain Scale: Hurts little more Pain Location: L hip  Pain Descriptors / Indicators: Sore;Tender Pain Intervention(s): Limited activity within patient's tolerance;Monitored during session    Home Living Family/patient expects to be discharged to:: Private residence Living Arrangements: Alone Available Help at Discharge: Family Type of Home: House Home Access: Stairs to enter Entrance Stairs-Rails: None Entrance Stairs-Number of Steps: 2-3 Home Layout: One level Home Equipment: Environmental consultant - 2 wheels      Prior Function Level of Independence: Needs assistance   Gait / Transfers Assistance Needed: walks with a walker, daughter reports very little activity since her husband's death 2 weeks ago, walking only  a few steps   ADL's / Homemaking Assistance Needed: can perform ADL independently, does laundry, dependent in meal prep and housekeeping  Comments: Pt's husband passed away 2 weeks ago.       Hand Dominance   Dominant Hand: Right    Extremity/Trunk Assessment   Upper Extremity Assessment Upper Extremity Assessment: Generalized weakness;Defer to OT evaluation    Lower Extremity Assessment Lower Extremity Assessment: Generalized weakness;LLE deficits/detail LLE Deficits / Details: knee flexion ~20* in supine, ~80* in sitting; hip flexion ~25* in supine, ~85* in sitting; strength 2+/5 grossly; AROM limited by pain    Cervical / Trunk Assessment Cervical / Trunk Assessment: Kyphotic  Communication   Communication: No difficulties  Cognition Arousal/Alertness: Awake/alert Behavior During Therapy: Anxious;Flat affect Overall Cognitive Status: Difficult to assess                                 General Comments: daughter confirming pt's responses to PLOF      General Comments General comments (skin integrity, edema, etc.): Pt's demeanor is she states she "just wants to go home". Per daughter report, pt has been worsening since pt's husband's death.      Exercises     Assessment/Plan    PT Assessment Patient needs continued PT services  PT Problem List Decreased strength;Decreased mobility;Decreased safety awareness;Decreased coordination;Obesity;Decreased activity tolerance;Decreased balance;Pain       PT Treatment Interventions DME instruction;Therapeutic activities;Gait training;Patient/family education;Therapeutic exercise;Stair training;Balance training;Functional mobility training;Neuromuscular re-education    PT Goals (Current goals can be found in the Care Plan section)  Acute Rehab PT Goals Patient Stated Goal: to go home PT Goal Formulation: With patient/family Time For Goal Achievement: 07/03/17 Potential to Achieve Goals: Fair    Frequency Min 3X/week   Barriers to discharge        Co-evaluation PT/OT/SLP Co-Evaluation/Treatment: Yes Reason for Co-Treatment: For patient/therapist safety PT goals addressed during session:  Mobility/safety with mobility;Proper use of DME OT goals addressed during session: ADL's and self-care       AM-PAC PT "6 Clicks" Daily Activity  Outcome Measure Difficulty turning over in bed (including adjusting bedclothes, sheets and blankets)?: A Lot Difficulty moving from lying on back to sitting on the side of the bed? : A Lot Difficulty sitting down on and standing up from a chair with arms (e.g., wheelchair, bedside commode, etc,.)?: Unable Help needed moving to and from a bed to chair (including a wheelchair)?: A Lot Help needed walking in hospital room?: A Lot Help needed climbing 3-5 steps with a railing? : Total 6 Click Score: 10    End of Session Equipment Utilized During Treatment: Gait belt Activity Tolerance: Patient limited by fatigue Patient left: with call bell/phone within reach;in chair;with chair alarm set   PT Visit Diagnosis: Muscle weakness (generalized) (M62.81);Adult, failure to thrive (R62.7);Difficulty in walking, not elsewhere classified (R26.2)    Time: 1240-1300 PT Time Calculation (min) (ACUTE ONLY): 20 min   Charges:   PT Evaluation $PT Eval Low Complexity: 1 Low     PT G CodesKenyon Ana, PT Pager: [phone] 06/19/2017   [facility] 06/19/2017, 3:13 PM

## 2017-06-19 NOTE — Progress Notes (Signed)
CSW received consult regarding SNF placement. CSW notes that patient recently discharged from [facility] and is in copay days ($160/day). Home health following patient.   Percell Locus Carma Dwiggins LCSW [phone]

## 2017-06-20 LAB — BASIC METABOLIC PANEL
Anion gap: 9 (ref 5–15)
BUN: 10 mg/dL (ref 6–20)
CALCIUM: 7.4 mg/dL — AB (ref 8.9–10.3)
CO2: 28 mmol/L (ref 22–32)
Chloride: 106 mmol/L (ref 101–111)
Creatinine, Ser: 0.69 mg/dL (ref 0.44–1.00)
GFR calc Af Amer: >60 mL/min (ref >60)
Glucose, Bld: 101 mg/dL — ABNORMAL HIGH (ref 65–99)
Potassium: 3.4 mmol/L — ABNORMAL LOW (ref 3.5–5.1)
SODIUM: 143 mmol/L (ref 135–145)

## 2017-06-20 LAB — CBC
HCT: 32.7 % — ABNORMAL LOW (ref 36.0–46.0)
Hemoglobin: 10.7 g/dL — ABNORMAL LOW (ref 12.0–15.0)
MCH: 32.4 pg (ref 26.0–34.0)
MCHC: 32.7 g/dL (ref 30.0–36.0)
MCV: 99.1 fL (ref 78.0–100.0)
Platelets: 190 10*3/uL (ref 150–400)
RBC: 3.3 MIL/uL — ABNORMAL LOW (ref 3.87–5.11)
RDW: 13.5 % (ref 11.5–15.5)
WBC: 6.7 10*3/uL (ref 4.0–10.5)

## 2017-06-20 LAB — URINE CULTURE
Culture: >=100000 — AB
Special Requests: NORMAL

## 2017-06-20 LAB — GLUCOSE, CAPILLARY
Glucose-Capillary: 91 mg/dL (ref 65–99)
Glucose-Capillary: 123 mg/dL — ABNORMAL HIGH (ref 65–99)

## 2017-06-20 MED ORDER — CIPROFLOXACIN HCL 500 MG PO TABS
500.0000 mg | ORAL_TABLET | Freq: Two times a day (BID) | ORAL | Status: DC
  Administered 2017-06-20: 500 mg via ORAL
  Filled 2017-06-20: qty 1

## 2017-06-20 MED ORDER — DULOXETINE HCL 20 MG PO CPEP: 20.0000 mg | ORAL_CAPSULE | Freq: Every day | ORAL | 3 refills | Status: DC

## 2017-06-20 MED ORDER — BACID PO TABS: 2.0000 | ORAL_TABLET | Freq: Two times a day (BID) | ORAL | 0 refills | Status: AC

## 2017-06-20 MED ORDER — DULOXETINE HCL 20 MG PO CPEP: 20.0000 mg | ORAL_CAPSULE | Freq: Every day | ORAL | Status: DC

## 2017-06-20 MED ORDER — ORAL CARE MOUTH RINSE
15.0000 mL | Freq: Two times a day (BID) | OROMUCOSAL | Status: DC
  Administered 2017-06-20: 15 mL via OROMUCOSAL

## 2017-06-20 MED ORDER — CIPROFLOXACIN HCL 500 MG PO TABS: 500.0000 mg | ORAL_TABLET | Freq: Two times a day (BID) | ORAL | 0 refills | Status: AC

## 2017-06-20 NOTE — Progress Notes (Signed)
CSW received consult regarding PT recommendation of SNF at discharge.  Patient is refusing SNF. She states she will be returning home with the help of her daughters. She states that she thinks she uses [facility] and does not need any equipment. She reports using a wheelchair to get around. RNCM aware. Patient aware that she is still in copay days at [facility].   CSW signing off.   Percell Locus Argus Caraher LCSW [phone]

## 2017-06-20 NOTE — Progress Notes (Signed)
Nsg Discharge Note  Admit Date:  06/17/2017 Discharge date: 06/20/2017   Rande Brunt Tuch to be D/C'd Home per MD order.  AVS completed.  Copy for chart, and copy for patient signed, and dated. Patient/caregiver able to verbalize understanding.  Discharge Medication: Allergies as of 06/20/2017      Reactions   Oxycodone Nausea And Vomiting   Penicillins Hives, Rash   Has patient had a PCN reaction causing immediate rash, facial/tongue/throat swelling, SOB or lightheadedness with hypotension: Yes Has patient had a PCN reaction causing severe rash involving mucus membranes or skin necrosis: No Has patient had a PCN reaction that required hospitalization: No Has patient had a PCN reaction occurring within the last 10 years: No If all of the above answers are "NO", then may proceed with Cephalosporin use.   Tramadol Nausea And Vomiting      Medication List    STOP taking these medications   pantoprazole 40 MG tablet Commonly known as:  PROTONIX     TAKE these medications   acetaminophen 500 MG tablet Commonly known as:  TYLENOL Take 500 mg by mouth every 6 (six) hours as needed (for pain or headaches).   ALPRAZolam 0.25 MG tablet Commonly known as:  XANAX Take 0.25 mg by mouth 3 (three) times daily as needed for anxiety.   aspirin 325 MG tablet Take 325 mg by mouth daily.   AZO BLADDER CONTROL/GO-LESS Caps Take 2 capsules by mouth at bedtime as needed (when bladder issues arise).   benzonatate 100 MG capsule Commonly known as:  TESSALON Take 1 capsule (100 mg total) by mouth every 8 (eight) hours.   ciprofloxacin 500 MG tablet Commonly known as:  CIPRO Take 1 tablet (500 mg total) by mouth 2 (two) times daily for 7 days.   DULoxetine 20 MG capsule Commonly known as:  CYMBALTA Take 1 capsule (20 mg total) by mouth daily. Start taking on:  06/21/2017 What changed:    medication strength  how much to take   fexofenadine 180 MG tablet Commonly known as:   ALLEGRA Take 180 mg by mouth daily.   gabapentin 300 MG capsule Commonly known as:  NEURONTIN Take 300-600 mg by mouth See admin instructions. Take 300 mg by mouth in the morning and 600 mg at bedtime   glipiZIDE 10 MG tablet Commonly known as:  GLUCOTROL Take 10 mg by mouth 2 (two) times daily before a meal.   KLOR-CON M20 20 MEQ tablet Generic drug:  potassium chloride SA Take 20 mEq by mouth 2 (two) times daily.   lactobacillus acidophilus Tabs tablet Take 2 tablets by mouth 2 (two) times daily for 10 days.   losartan 50 MG tablet Commonly known as:  COZAAR Take 50 mg by mouth daily.   metFORMIN 1000 MG tablet Commonly known as:  GLUCOPHAGE Take 1,000 mg by mouth 2 (two) times daily.   pravastatin 40 MG tablet Commonly known as:  PRAVACHOL Take 40 mg by mouth daily.   ranitidine 300 MG tablet Commonly known as:  ZANTAC Take 300 mg by mouth daily. What changed:  Another medication with the same name was removed. Continue taking this medication, and follow the directions you see here.   torsemide 20 MG tablet Commonly known as:  DEMADEX Take 20 mg by mouth daily.   Vitamin D-3 1000 units Caps Take 1,000 Units by mouth daily.       Discharge Assessment: Vitals:   06/20/17 0507 06/20/17 1326  BP: 132/63 (!) 149/61  Pulse: 81 (!) 57  Resp: 20 16  Temp: 97.8 F (36.6 C) 97.8 F (36.6 C)  SpO2: 96% 98%   Skin clean, dry and intact without evidence of skin break down, no evidence of skin tears noted. IV catheter discontinued intact. Site without signs and symptoms of complications - no redness or edema noted at insertion site, patient denies c/o pain - only slight tenderness at site.  Dressing with slight pressure applied.  D/c Instructions-Education: Discharge instructions given to patient/family with verbalized understanding. D/c education completed with patient/family including follow up instructions, medication list, d/c activities limitations if indicated,  with other d/c instructions as indicated by MD - patient able to verbalize understanding, all questions fully answered. Patient instructed to return to ED, call 911, or call MD for any changes in condition.  Patient escorted via [place], and D/C home via private auto.  [place] N Emmalin Jaquess, RN 06/20/2017 4:17 PM

## 2017-06-20 NOTE — Discharge Summary (Signed)
Physician Discharge Summary [facility]   Patient: Melissa Dalton                   Admit date: 06/17/2017   DOB: 01-Jul-1934             Discharge date:06/20/2017/12:11 PM KPT:[id number]                           PCP: Chesley Noon, MD Recommendations for Outpatient Follow-up:   1.  Please follow-up with your primary care physician within 1-2 weeks. 2.  F/up with Home Health/PT/OT/RN/Aid    Discharge Condition: Stable  CODE STATUS:  Full code   Diet recommendation: Diabetic diet   ----------------------------------------------------------------------------------------------------------------------  Discharge Diagnoses:   Principal Problem:   UTI (urinary tract infection) Active Problems:   Acute metabolic encephalopathy   Diabetes mellitus without complication (HCC)   HLD (hyperlipidemia)   Sepsis ([place])   AKI (acute kidney injury) ([place])   Essential hypertension   GERD (gastroesophageal reflux disease)   Depression with anxiety   History of present illness : Melissa Klute Southernis a 83 y.o.femalewith medical history significant ofhypertension, hyperlipidemia, diabetes mellitus, GERD, depression, anxiety, who presents with dysuria, burning on urination, increased urinary frequency and altered mental status.  Currently she was found septic due to UTI, hypotensive, tachycardic, hypotensive, mental status. Daughters report at baseline she ambulates with assist.  Progressively gotten weaker over for last couple weeks.  No history of stroke or MIs.  Hospital course / Brief Summary: Patient was subsequently admitted for acute encephalopathy, was thought to be induced and exacerbated by sepsis, infection, primary source of urine tract infection.  On admission patient was septic, with lactic acid of 4.7.  Was treated aggressively for sepsis per sepsis protocol, she is a source of infection was thought to be urine tract infection, started on IV antibiotics of Rocephin.   Subsequent blood cultures revealed Klebsiella, pansensitive.  Patient subsequently stabilized, continue to improve, antibiotics was switched to p.o. on 06/20/2017. Continues to complain of severe generalized weaknesses to the point that she cannot ambulate without assist from the bed to chair,  Or to stand up.  Patient has been exacerbated with her recent infection, sepsis.  Vascular PT/OT has seen and evaluate the patient, recommended SNF.  Patient has been recently SNF where she has used all her days.  She had a co-pay per day but she cannot afford it. Home had, PT/OT/RN/aid and social worker has been arranged.  She is to be discharged home with oral antibiotics for 7 more days to complete total of 10 days of antibiotic coverage. ------------------------------------------------------------------------------------------------------------------------------------------ For detailed discharge summary please see below:   Sepsis due toUTI (urinary tract infection): -On admission met the sepsis criteria, patient has been treated aggressively per sepsis protocol, much improved -Urine culture growing greater than 100,000 colonies of Klebsiella, pending sensitivity -Pending blood cultures, negative to date -was on  IV antibiotics Rocephin 06/20/2017, 97 switched to p.o. ciprofloxacin -Currently stable, afebrile, normotensive, improved leukocytosis  -Lactic acid is improved from 1.7--->0.0,  Acute metabolic encephalopathy: -Back to baseline, exacerbated by infection, sepsis as above.  Diabetes mellitus without complication ([place]): -resume home medication of metformin and glipizide -Diabetic diet  HLD: -Fenofibrate and pravastatin  AKI (acute kidney injury):  -ATN, exacerbated by hypotension -BUN/creatinine 25/1.29 on admission, improved to 21/1.02 -Resuming her home medication of torsemide and Cozaar  HTN: -improved, cont home meds   Depression and anxiety: -Stable, no  suicidal or  homicidal ideations. -Continue home medications:Xanax, Cymbalta (reduced dose patient will be on ciprofloxacin)  GERD: -Holding PPI this patient will be on prolonged antibiotics to avoid side effect including C. difficile colitis.  Severe debility -Per family patient is unable to ambulate or carry her ADLs -PT/ OT has recommended SNF, social worker reporting patient was on [facility] [place] and her co-pay is $160 per day -but unsure if patient can afford it. -We will discuss with her daughter, as for now we will presume with home health arrangement. -Home Health has been arranged  -Patient and her daughter states that she cannot afford the copayment.  Therefore home health PT OT RN and aide and social worker has been arranged.  Disposition-home with home health ----------------------------------------------------------------------------------------------------------------------  Discharge Instructions:   Discharge Instructions    Activity as tolerated - No restrictions   Complete by:  As directed    Diet - low sodium heart healthy   Complete by:  As directed    Diet Carb Modified   Complete by:  As directed    Discharge instructions   Complete by:  As directed    F/up with HH   Increase activity slowly   Complete by:  As directed        Medication List    STOP taking these medications   pantoprazole 40 MG tablet Commonly known as:  PROTONIX     TAKE these medications   acetaminophen 500 MG tablet Commonly known as:  TYLENOL Take 500 mg by mouth every 6 (six) hours as needed (for pain or headaches).   ALPRAZolam 0.25 MG tablet Commonly known as:  XANAX Take 0.25 mg by mouth 3 (three) times daily as needed for anxiety.   aspirin 325 MG tablet Take 325 mg by mouth daily.   AZO BLADDER CONTROL/GO-LESS Caps Take 2 capsules by mouth at bedtime as needed (when bladder issues arise).   benzonatate 100 MG capsule Commonly known as:  TESSALON Take 1 capsule (100 mg  total) by mouth every 8 (eight) hours.   ciprofloxacin 500 MG tablet Commonly known as:  CIPRO Take 1 tablet (500 mg total) by mouth 2 (two) times daily for 7 days.   DULoxetine 20 MG capsule Commonly known as:  CYMBALTA Take 1 capsule (20 mg total) by mouth daily. Start taking on:  06/21/2017 What changed:    medication strength  how much to take   fexofenadine 180 MG tablet Commonly known as:  ALLEGRA Take 180 mg by mouth daily.   gabapentin 300 MG capsule Commonly known as:  NEURONTIN Take 300-600 mg by mouth See admin instructions. Take 300 mg by mouth in the morning and 600 mg at bedtime   glipiZIDE 10 MG tablet Commonly known as:  GLUCOTROL Take 10 mg by mouth 2 (two) times daily before a meal.   KLOR-CON M20 20 MEQ tablet Generic drug:  potassium chloride SA Take 20 mEq by mouth 2 (two) times daily.   lactobacillus acidophilus Tabs tablet Take 2 tablets by mouth 2 (two) times daily for 10 days.   losartan 50 MG tablet Commonly known as:  COZAAR Take 50 mg by mouth daily.   metFORMIN 1000 MG tablet Commonly known as:  GLUCOPHAGE Take 1,000 mg by mouth 2 (two) times daily.   pravastatin 40 MG tablet Commonly known as:  PRAVACHOL Take 40 mg by mouth daily.   ranitidine 300 MG tablet Commonly known as:  ZANTAC Take 300 mg by mouth daily. What changed:  Another medication with the same name was removed. Continue taking this medication, and follow the directions you see here.   torsemide 20 MG tablet Commonly known as:  DEMADEX Take 20 mg by mouth daily.   Vitamin D-3 1000 units Caps Take 1,000 Units by mouth daily.       Allergies  Allergen Reactions  . Oxycodone Nausea And Vomiting  . Penicillins Hives and Rash    Has patient had a PCN reaction causing immediate rash, facial/tongue/throat swelling, SOB or lightheadedness with hypotension: Yes Has patient had a PCN reaction causing severe rash involving mucus membranes or skin necrosis: No Has  patient had a PCN reaction that required hospitalization: No Has patient had a PCN reaction occurring within the last 10 years: No If all of the above answers are "NO", then may proceed with Cephalosporin use.   . Tramadol Nausea And Vomiting      Procedures/Studies: Dg Chest 2 View  Result Date: 06/17/2017 CLINICAL DATA:  Sepsis EXAM: CHEST - 2 VIEW COMPARISON:  05/18/2017 FINDINGS: No focal opacity or pleural effusion. Stable cardiomediastinal silhouette with aortic atherosclerosis. No pneumothorax. IMPRESSION: No active cardiopulmonary disease. Electronically Signed   By: Donavan Foil M.D.   On: 06/17/2017 20:53   Ct Head Wo Contrast  Result Date: 06/18/2017 CLINICAL DATA:  Altered LOC generalize weakness EXAM: CT HEAD WITHOUT CONTRAST TECHNIQUE: Contiguous axial images were obtained from the base of the skull through the vertex without intravenous contrast. COMPARISON:  MRI brain 04/24/2017, CT brain 04/23/2017 FINDINGS: Brain: No acute territorial infarction, hemorrhage or intracranial mass is visualized. Atrophy with small vessel ischemic changes of the white matter. Stable ventricle size. Vascular: No hyperdense vessels.  Carotid vascular calcification Skull: Normal. Negative for fracture or focal lesion. Sinuses/Orbits: No acute finding. Other: None IMPRESSION: 1. No CT evidence for acute intracranial abnormality. 2. Atrophy with small vessel ischemic changes of the white matter Electronically Signed   By: Donavan Foil M.D.   On: 06/18/2017 01:44      Subjective: Patient was seen and examined 06/20/2017, 12:11 PM Patient stable  Today. No acute distress.  No issues overnight Stable for discharge.  Discharge Exam:  Vitals:   06/19/17 1326 06/19/17 2119 06/19/17 2121 06/20/17 0507  BP: 129/71 (!) 129/39 (!) 115/49 132/63  Pulse: (!) 57 80 77 81  Resp: '20 16  20  ' Temp: 97.9 F (36.6 C) 98 F (36.7 C)  97.8 F (36.6 C)  TempSrc: Oral     SpO2: 97% 95%  96%  Weight:        Height:        General: Pt lying comfortably in bed & appears in no obvious distress. Cardiovascular: S1 & S2 heard, RRR, S1/S2 +. No murmurs, rubs, gallops or clicks. No JVD or pedal edema. Respiratory: Clear to auscultation without wheezing, rhonchi or crackles. No increased work of breathing. Abdominal:  Non distended, non tender & soft. No organomegaly or masses appreciated. Normal bowel sounds heard. CNS: Alert and oriented. No focal deficits. Extremities: no edema, no cyanosis    The results of significant diagnostics from this hospitalization (including imaging, microbiology, ancillary and laboratory) are listed below for reference.     Microbiology: Recent Results (from the past 240 hour(s))  Urine culture     Status: Abnormal   Collection Time: 06/17/17  7:23 PM  Result Value Ref Range Status   Specimen Description URINE, CATHETERIZED  Final   Special Requests   Final    Normal Performed  at [facility], [place] [address] [place], [place] [address]    Culture >=100,000 COLONIES/mL KLEBSIELLA ORNITHINOLYTICA (A)  Final   Report Status 06/20/2017 FINAL  Final   Organism ID, Bacteria KLEBSIELLA ORNITHINOLYTICA (A)  Final      Susceptibility   Klebsiella ornithinolytica - MIC*    AMPICILLIN >=32 RESISTANT Resistant     CEFAZOLIN <=4 SENSITIVE Sensitive     CEFTRIAXONE <=1 SENSITIVE Sensitive     CIPROFLOXACIN <=0.25 SENSITIVE Sensitive     GENTAMICIN <=1 SENSITIVE Sensitive     IMIPENEM 0.5 SENSITIVE Sensitive     NITROFURANTOIN <=16 SENSITIVE Sensitive     TRIMETH/SULFA <=20 SENSITIVE Sensitive     AMPICILLIN/SULBACTAM 4 SENSITIVE Sensitive     PIP/TAZO <=4 SENSITIVE Sensitive     * >=100,000 COLONIES/mL KLEBSIELLA ORNITHINOLYTICA  Culture, blood (Routine x 2)     Status: None (Preliminary result)   Collection Time: 06/17/17  7:44 PM  Result Value Ref Range Status   Specimen Description BLOOD RIGHT ANTECUBITAL  Final   Special Requests   Final    BOTTLES  DRAWN AEROBIC AND ANAEROBIC Blood Culture adequate volume   Culture   Final    NO GROWTH 3 DAYS Performed at [facility], [address], [place], [place] [address]    Report Status PENDING  Incomplete  Culture, blood (Routine x 2)     Status: None (Preliminary result)   Collection Time: 06/17/17  7:50 PM  Result Value Ref Range Status   Specimen Description BLOOD LEFT ARM  Final   Special Requests   Final    BOTTLES DRAWN AEROBIC AND ANAEROBIC Blood Culture adequate volume   Culture   Final    NO GROWTH 3 DAYS Performed at [facility], [address], [place], [place] [address]    Report Status PENDING  Incomplete     Labs: CBC: Recent Labs  Lab 06/17/17 1919 06/18/17 0441 06/19/17 0356 06/20/17 0518  WBC 10.9* 9.4 6.6 6.7  NEUTROABS 8.0*  --   --   --   HGB 13.4 11.3* 10.2* 10.7*  HCT 40.6 34.5* 31.4* 32.7*  MCV 97.8 98.0 101.0* 99.1  PLT 305 232 197 387   Basic Metabolic Panel: Recent Labs  Lab 06/17/17 1919 06/18/17 0441 06/19/17 0356 06/20/17 0518  NA 145 142 143 143  K 3.9 3.4* 3.2* 3.4*  CL 99* 103 107 106  CO2 '28 30 27 28  ' GLUCOSE 78 137* 93 101*  BUN 25* 21* 16 10  CREATININE 1.29* 1.02* 0.77 0.69  CALCIUM 8.4* 7.3* 6.9* 7.4*   Liver Function Tests: Recent Labs  Lab 06/17/17 1919  AST 34  ALT 20  ALKPHOS 42  BILITOT 0.8  PROT 6.7  ALBUMIN 3.3*   BNP (last 3 results) Recent Labs    05/18/17 2254 06/17/17 2345  BNP 47.6 44.3   Cardiac Enzymes: No results for input(s): CKTOTAL, CKMB, CKMBINDEX, TROPONINI in the last 168 hours. CBG: Recent Labs  Lab 06/19/17 1155 06/19/17 1708 06/19/17 2117 06/20/17 0813 06/20/17 1203  GLUCAP 107* 120* 121* 91 123*  Urinalysis    Component Value Date/Time   COLORURINE AMBER (A) 06/17/2017 2001   APPEARANCEUR CLEAR 06/17/2017 2001   LABSPEC 1.008 06/17/2017 2001   PHURINE 5.0 06/17/2017 2001   GLUCOSEU NEGATIVE 06/17/2017 2001   HGBUR NEGATIVE 06/17/2017 2001   [place] 06/17/2017 2001   [place] 06/17/2017 [place] NEGATIVE 06/17/2017 2001  NITRITE POSITIVE (A) 06/17/2017 2001   LEUKOCYTESUR NEGATIVE 06/17/2017 2001    Time coordinating discharge: Over 30 minutes  SIGNED: Deatra James, MD, FACP, [facility]. [facility],  Pager [phone]  If 7PM-7AM, please contact night-coverage [email or web address] Ota [facility] 06/20/2017, 12:11 PM

## 2017-06-20 NOTE — Consult Note (Addendum)
   [facility] CM Inpatient Consult   06/20/2017  Tashawnda Bleiler [facility] 09/15/1934 [id number]   Patient is currently active with [facility] for chronic disease management services in the [facility] Advantage plan.  Patient has been engaged by a [facility] RN [employer]. Active consent form on file. A brochure and 24 hour nurse advise line magnet given with contact information. Our community based plan of care has focused on disease management and community resource support.  Met with the patient, daughter, [name], and patient's niece Clarise Cruz at the bedside regarding ongoing needs.  Patient is active with [place] and will have additional services added for [facility] and bath aide, was active with [place] and [place] previously.  Patient is interested in Meals on Wheels and thought that a friend had already referred her but since her husband's recent death she states, "I don't know what all we have and can do." [name] states, "yes, we just buried my father last week."  All patient and daughter began crying.Offered empathy and encouraged expressing feelings.  Daughter states, "he died at this hospital... I hate this hospital [crying]."  Niece states and hugging daughter, "we have to have hospitals." Spoke with them regarding [facility] [address] [facility] ongoing for post hospital care and how [facility] can follow up with resources needed.  Daughter states, "we have a hard time with transportation for mom as she can only get into my sister's car and my sister works full time.  We also need to see if she can get those meals on wheels.  We live in [place] but we are in [place]."  [name] does her mom's medication management and they both feel comfortable with that.  Primary Care Provider;  Anastasia Pall, MD [name] [place]      Made Inpatient Case Manager aware that [place] [facility] following. Of note, [facility] Care Management services does not replace or interfere with any services that are needed or arranged by  inpatient case management or social work.  For additional questions or referrals please contact:  Natividad Brood, RN BSN [facility] Liaison  [phone] business mobile phone Toll free office [phone]

## 2017-06-20 NOTE — Care Management Note (Signed)
Case Management Note  Patient Details  Name: Melissa Dalton MRN: [id number] Date of Birth: 11/02/1934  Subjective/Objective:     Acute encephalopathy of unknown origin, possible pneumonia. PMH OA, DM, HTN, hernia repair. Resides with daughter [name].              [name] (Daughter) [name] (Daughter)    [phone]     PCP: Anastasia Pall  Action/Plan: Transition to home with family with home health services to follow.  Transportation will be provided by daughter Edd Fabian @ 5pm once she's off work.  Expected Discharge Date:   06/20/2017            Expected Discharge Plan:  [place]  In-House Referral:   [facility]  Discharge planning Services  CM Consult  Post Acute Care Choice:    Choice offered to:  Adult Children([place])  DME Arranged:  (OWNS W/C, LIFT CHAIR, Civil engineer, contracting) DME Agency:     HH Arranged:  RN, PT, OT, Nurse's Aide [place] Agency:  [place]  Status of Service:  Completed, signed off  If discussed at [place] of Stay Meetings, dates discussed:    Additional Comments:  Sharin Mons, RN 06/20/2017, 12:07 PM

## 2017-06-20 NOTE — Care Management Important Message (Signed)
Important Message  Patient Details  Name: Melissa Dalton MRN: [id number] Date of Birth: 04-28-1934   Medicare Important Message Given:  Yes    [name] [name] 06/20/2017, 1:42 PM

## 2017-06-22 DIAGNOSIS — Z9181 History of falling: Secondary | ICD-10-CM | POA: Diagnosis not present

## 2017-06-22 DIAGNOSIS — M6281 Muscle weakness (generalized): Secondary | ICD-10-CM | POA: Diagnosis not present

## 2017-06-22 DIAGNOSIS — M1990 Unspecified osteoarthritis, unspecified site: Secondary | ICD-10-CM | POA: Diagnosis not present

## 2017-06-22 DIAGNOSIS — M109 Gout, unspecified: Secondary | ICD-10-CM | POA: Diagnosis not present

## 2017-06-22 DIAGNOSIS — R278 Other lack of coordination: Secondary | ICD-10-CM | POA: Diagnosis not present

## 2017-06-22 LAB — CULTURE, BLOOD (ROUTINE X 2)
CULTURE: NO GROWTH
Culture: NO GROWTH
SPECIAL REQUESTS: ADEQUATE
SPECIAL REQUESTS: ADEQUATE

## 2017-06-23 ENCOUNTER — Other Ambulatory Visit: Payer: Self-pay | Admitting: *Deleted

## 2017-06-23 DIAGNOSIS — E559 Vitamin D deficiency, unspecified: Secondary | ICD-10-CM | POA: Diagnosis not present

## 2017-06-23 DIAGNOSIS — M1990 Unspecified osteoarthritis, unspecified site: Secondary | ICD-10-CM | POA: Diagnosis not present

## 2017-06-23 DIAGNOSIS — M6281 Muscle weakness (generalized): Secondary | ICD-10-CM | POA: Diagnosis not present

## 2017-06-23 DIAGNOSIS — Z9181 History of falling: Secondary | ICD-10-CM | POA: Diagnosis not present

## 2017-06-23 DIAGNOSIS — Z7982 Long term (current) use of aspirin: Secondary | ICD-10-CM | POA: Diagnosis not present

## 2017-06-23 DIAGNOSIS — J189 Pneumonia, unspecified organism: Secondary | ICD-10-CM | POA: Diagnosis not present

## 2017-06-23 DIAGNOSIS — M109 Gout, unspecified: Secondary | ICD-10-CM | POA: Diagnosis not present

## 2017-06-23 DIAGNOSIS — K219 Gastro-esophageal reflux disease without esophagitis: Secondary | ICD-10-CM | POA: Diagnosis not present

## 2017-06-23 DIAGNOSIS — G4700 Insomnia, unspecified: Secondary | ICD-10-CM | POA: Diagnosis not present

## 2017-06-23 DIAGNOSIS — Z7984 Long term (current) use of oral hypoglycemic drugs: Secondary | ICD-10-CM | POA: Diagnosis not present

## 2017-06-23 DIAGNOSIS — I10 Essential (primary) hypertension: Secondary | ICD-10-CM | POA: Diagnosis not present

## 2017-06-23 DIAGNOSIS — E669 Obesity, unspecified: Secondary | ICD-10-CM | POA: Diagnosis not present

## 2017-06-23 DIAGNOSIS — E119 Type 2 diabetes mellitus without complications: Secondary | ICD-10-CM | POA: Diagnosis not present

## 2017-06-23 NOTE — Patient Outreach (Signed)
Transition of care call #1. Today, I spoke with Melissa Dalton, pt's daughter. She reports pt came home on Friday. Melissa Dalton is much improved with her antibiotic treatment. She is cognitively intact and able to ambulate with her walker. She is able to get up and ambulate independently without someone to assist her. The family is staying with her 24 hours a day at present. [name] tells me she spoke with a gentleman at [employer] that told her they could have 35 hours of caregiving paid for on a weekly basis. I advised that I do not think  That is accurate, that usually it is only the homehealth nursing, OT, PT, Bath aid that is provided while she is getting therapy. I advised her I would check with [facility] to clarify this matter. I advised I know a nurse aid in their area that I can inquire as to if she maybe able to assist them. They would need to pay her fee privately. I will call [name] back with this information.  Pt has all her meds. She has a previously scheduled appt with her MD on 07/16/17, however, I have asked her daughter to call MD, explain she just got out of the hosptial and the request a sooner appt. She said she would do so.  I gave her my name and number and encouraged her to call me anytime this week while her primary care manager is out of the office.  Eulah Pont. Myrtie Neither, MSN, [facility] Gerontological Nurse Practitioner [facility] Care Management [phone]

## 2017-06-24 DIAGNOSIS — E119 Type 2 diabetes mellitus without complications: Secondary | ICD-10-CM | POA: Diagnosis not present

## 2017-06-24 DIAGNOSIS — E669 Obesity, unspecified: Secondary | ICD-10-CM | POA: Diagnosis not present

## 2017-06-24 DIAGNOSIS — G4700 Insomnia, unspecified: Secondary | ICD-10-CM | POA: Diagnosis not present

## 2017-06-24 DIAGNOSIS — E559 Vitamin D deficiency, unspecified: Secondary | ICD-10-CM | POA: Diagnosis not present

## 2017-06-24 DIAGNOSIS — Z7982 Long term (current) use of aspirin: Secondary | ICD-10-CM | POA: Diagnosis not present

## 2017-06-24 DIAGNOSIS — Z9181 History of falling: Secondary | ICD-10-CM | POA: Diagnosis not present

## 2017-06-24 DIAGNOSIS — Z7984 Long term (current) use of oral hypoglycemic drugs: Secondary | ICD-10-CM | POA: Diagnosis not present

## 2017-06-24 DIAGNOSIS — I10 Essential (primary) hypertension: Secondary | ICD-10-CM | POA: Diagnosis not present

## 2017-06-24 DIAGNOSIS — M109 Gout, unspecified: Secondary | ICD-10-CM | POA: Diagnosis not present

## 2017-06-24 DIAGNOSIS — M6281 Muscle weakness (generalized): Secondary | ICD-10-CM | POA: Diagnosis not present

## 2017-06-24 DIAGNOSIS — K219 Gastro-esophageal reflux disease without esophagitis: Secondary | ICD-10-CM | POA: Diagnosis not present

## 2017-06-24 DIAGNOSIS — J189 Pneumonia, unspecified organism: Secondary | ICD-10-CM | POA: Diagnosis not present

## 2017-06-24 DIAGNOSIS — M1990 Unspecified osteoarthritis, unspecified site: Secondary | ICD-10-CM | POA: Diagnosis not present

## 2017-06-25 ENCOUNTER — Encounter: Payer: Self-pay | Admitting: Licensed Clinical Social Worker

## 2017-06-25 ENCOUNTER — Other Ambulatory Visit: Payer: Self-pay | Admitting: Licensed Clinical Social Worker

## 2017-06-25 NOTE — Patient Outreach (Signed)
Assessment:  CSW received referral on [place].  CSW completed chart review on client on 06/25/17.  Client was recently hospitalized from 06/17/17 to 06/20/17. She discharged from the hospital on 06/20/17 and returned to her home in the community with needed supports in place. Client has support from her two daughters, [name] and [name].  [facility] is providing in home health services for client as scheduled. Client is receiving home health physical therapy,  home health occupational therapy, home health nursing services and help from a home health aide.  Dr. Chesley Noon is primary care doctor for client. Client  has Health [employer]. Of note, spouse of client recently died.  Client is still in process of adjusting to recent death of her spouse.  She thus is experiencing grief over recent death of her spouse. Also, she has a history of depression and anxiety.  CSW called home phone number of client on 06/25/17.  CSW spoke via phone with Ronie Spies. CSW verified identity of Sadonna Kotara. CSW received verbal permission on 06/25/17 for CSW to speak with client about client needs and status. Client said she uses a walker to ambulate. She said she had no pain issues. She said she has support from her two daughters.  CSW and client completed needed [facility] assessments for client.  Client said she has experienced a weight loss of about 15 pounds in about one month. She said she is scheduled for an appointment with her primary care doctor next week.  CSW spoke with client about client care plan. CSW encouraged client to communicate with CSW in next 30 days to discuss grief issues of client and grief symptoms experienced by client over recent death of her spouse. Client said she has needed durable medical equipment at her home.  CSW informed client of [facility] program support in nursing, social work, and pharmacy.  CSW thanked client for phone call with CSW on 06/25/17.  Client was appreciative of CSW phone call on 06/25/17.  Plan:  Client to communicate with CSW in next 30 days to discuss grief issues of client and grief symptoms experienced by client  over the recent death of spouse of client.   CSW to call client in 3 weeks to assess client needs at that time.   [name].Avalynne Diver MSW, LCSW Licensed Clinical Social Worker [facility] Care Management [phone]

## 2017-06-26 ENCOUNTER — Other Ambulatory Visit: Payer: Self-pay | Admitting: *Deleted

## 2017-06-26 NOTE — Patient Outreach (Signed)
[facility]) Care Management  06/26/2017  Kline Mar 04, 1934 [id number]   Care coordination   [facility] CM received a message from [place] at that patient's daughter called to request a change in appointment related to a conflict with her MD appointment on 07/02/17 at [place] spoke with [name] Verified patient's identity and verbal consent provided Cancelled Jul 02, 2017 1130 appointment per request Appointment changed to Thursday May 30 at 1330.  Discussed with [name] that if there is a conflict [facility] CM will contact her upon her return.  Encouraged [name] and Mrs [place] to contact [facility] or this CM if further concerns  [facility] [place] CM updated [facility] CM via e-mail  Joelene Millin L. Lavina Hamman, RN, BSN, CCM [facility] Telephonic Care Management Care Coordinator Direct number [phone]  Main [facility] number [phone] Fax number [phone]

## 2017-07-02 ENCOUNTER — Emergency Department (HOSPITAL_COMMUNITY): Payer: PPO

## 2017-07-02 ENCOUNTER — Emergency Department (HOSPITAL_COMMUNITY)
Admission: EM | Admit: 2017-07-02 | Discharge: 2017-07-02 | Disposition: A | Discharge status: Home / Self Care | Payer: PPO | Attending: Emergency Medicine | Admitting: Emergency Medicine

## 2017-07-02 ENCOUNTER — Other Ambulatory Visit: Payer: Self-pay

## 2017-07-02 ENCOUNTER — Ambulatory Visit: Payer: Self-pay | Admitting: *Deleted

## 2017-07-02 ENCOUNTER — Encounter (HOSPITAL_COMMUNITY): Payer: Self-pay | Admitting: Emergency Medicine

## 2017-07-02 DIAGNOSIS — Z7984 Long term (current) use of oral hypoglycemic drugs: Secondary | ICD-10-CM | POA: Diagnosis not present

## 2017-07-02 DIAGNOSIS — E162 Hypoglycemia, unspecified: Secondary | ICD-10-CM | POA: Insufficient documentation

## 2017-07-02 DIAGNOSIS — I10 Essential (primary) hypertension: Secondary | ICD-10-CM | POA: Insufficient documentation

## 2017-07-02 DIAGNOSIS — R001 Bradycardia, unspecified: Secondary | ICD-10-CM | POA: Diagnosis not present

## 2017-07-02 DIAGNOSIS — E11649 Type 2 diabetes mellitus with hypoglycemia without coma: Secondary | ICD-10-CM | POA: Diagnosis not present

## 2017-07-02 DIAGNOSIS — E161 Other hypoglycemia: Secondary | ICD-10-CM | POA: Diagnosis not present

## 2017-07-02 DIAGNOSIS — R0902 Hypoxemia: Secondary | ICD-10-CM | POA: Diagnosis not present

## 2017-07-02 DIAGNOSIS — Z7982 Long term (current) use of aspirin: Secondary | ICD-10-CM | POA: Diagnosis not present

## 2017-07-02 DIAGNOSIS — M545 Low back pain: Secondary | ICD-10-CM | POA: Diagnosis not present

## 2017-07-02 DIAGNOSIS — Z79899 Other long term (current) drug therapy: Secondary | ICD-10-CM | POA: Diagnosis not present

## 2017-07-02 DIAGNOSIS — M4306 Spondylolysis, lumbar region: Secondary | ICD-10-CM | POA: Diagnosis not present

## 2017-07-02 DIAGNOSIS — M47816 Spondylosis without myelopathy or radiculopathy, lumbar region: Secondary | ICD-10-CM | POA: Diagnosis not present

## 2017-07-02 DIAGNOSIS — M5489 Other dorsalgia: Secondary | ICD-10-CM | POA: Diagnosis not present

## 2017-07-02 LAB — CBC WITH DIFFERENTIAL/PLATELET
BASOS ABS: 0 10*3/uL (ref 0.0–0.1)
Basophils Relative: 0 %
Eosinophils Absolute: 0.2 10*3/uL (ref 0.0–0.7)
Eosinophils Relative: 2 %
HCT: 37.9 % (ref 36.0–46.0)
HEMOGLOBIN: 12.5 g/dL (ref 12.0–15.0)
LYMPHS PCT: 19 %
Lymphs Abs: 1.4 10*3/uL (ref 0.7–4.0)
MCH: 32.8 pg (ref 26.0–34.0)
MCHC: 33 g/dL (ref 30.0–36.0)
MCV: 99.5 fL (ref 78.0–100.0)
Monocytes Absolute: 0.5 10*3/uL (ref 0.1–1.0)
Monocytes Relative: 6 %
NEUTROS ABS: 5.3 10*3/uL (ref 1.7–7.7)
Neutrophils Relative %: 73 %
Platelets: 288 10*3/uL (ref 150–400)
RBC: 3.81 MIL/uL — AB (ref 3.87–5.11)
RDW: 14.4 % (ref 11.5–15.5)
WBC: 7.4 10*3/uL (ref 4.0–10.5)

## 2017-07-02 LAB — BASIC METABOLIC PANEL
Anion gap: 9 (ref 5–15)
BUN: 12 mg/dL (ref 6–20)
CO2: 27 mmol/L (ref 22–32)
Calcium: 9.2 mg/dL (ref 8.9–10.3)
Chloride: 103 mmol/L (ref 101–111)
Creatinine, Ser: 0.61 mg/dL (ref 0.44–1.00)
GFR calc Af Amer: >60 mL/min (ref >60)
Glucose, Bld: 104 mg/dL — ABNORMAL HIGH (ref 65–99)
POTASSIUM: 4.4 mmol/L (ref 3.5–5.1)
SODIUM: 139 mmol/L (ref 135–145)

## 2017-07-02 LAB — URINALYSIS, ROUTINE W REFLEX MICROSCOPIC
Bilirubin Urine: NEGATIVE
GLUCOSE, UA: NEGATIVE mg/dL
Hgb urine dipstick: NEGATIVE
Ketones, ur: NEGATIVE mg/dL
LEUKOCYTES UA: NEGATIVE
Nitrite: NEGATIVE
PH: 6 (ref 5.0–8.0)
PROTEIN: NEGATIVE mg/dL
SPECIFIC GRAVITY, URINE: 1.005 (ref 1.005–1.030)

## 2017-07-02 LAB — I-STAT CG4 LACTIC ACID, ED: Lactic Acid, Venous: 1.06 mmol/L (ref 0.5–1.9)

## 2017-07-02 LAB — CBG MONITORING, ED: GLUCOSE-CAPILLARY: 72 mg/dL (ref 65–99)

## 2017-07-02 MED ORDER — SODIUM CHLORIDE 0.9 % IV BOLUS
500.0000 mL | Freq: Once | INTRAVENOUS | Status: AC
  Administered 2017-07-02: 500 mL via INTRAVENOUS

## 2017-07-02 MED ORDER — SODIUM CHLORIDE 0.9 % IV SOLN
INTRAVENOUS | Status: DC
  Administered 2017-07-02: 11:00:00 via INTRAVENOUS

## 2017-07-02 MED ORDER — KETOROLAC TROMETHAMINE 30 MG/ML IJ SOLN
15.0000 mg | Freq: Once | INTRAMUSCULAR | Status: AC
  Administered 2017-07-02: 15 mg via INTRAVENOUS
  Filled 2017-07-02: qty 1

## 2017-07-02 NOTE — Progress Notes (Signed)
[facility]  Patient Status: Active (receiving services up to time of hospitalization)  [facility] is providing the following services: RN, PT and OT  If patient discharges after hours, please call [phone].   Edwinna Areola 07/02/2017, 10:33 AM

## 2017-07-02 NOTE — ED Provider Notes (Signed)
[facility] DEPT Provider Note   CSN: [id number] Arrival date & time: 07/02/17  7371     History   Chief Complaint Chief Complaint  Patient presents with  . Hip Pain  . Back Pain    HPI Melissa Dalton is a 83 y.o. female.  HPI   She presents for evaluation of low back pain which she noticed this morning, when she tried to get out of bed.  This pain made her feel "paralyzed."  She was unable to get out of bed.  EMS was called and transferred her here.  They found that her CBG was low at 45, and treated her with oral supplementation.  CBG improved to 80.  Patient did not have altered mental status this morning.  She has not had any altered oral intake recently.  She denies nausea, vomiting, fever, chills, dysuria, constipation, focal weakness or paresthesia.  She has not had any cough or shortness of breath.  She has chronic hip pain left greater than right.  She has known hip arthritis.  She was recently hospitalized and treated for UTI.  There are no other known modifying factors.  Past Medical History:  Diagnosis Date  . Anxiety   . Arthritis   . Complication of anesthesia    DIFFICULT TO WAKE AFTER HERNIA OPERATION  . Depression   . Diabetes ([place])   . High cholesterol   . HTN (hypertension)   . PONV (postoperative nausea and vomiting)   . UTI (urinary tract infection) 06/18/2017    Patient Active Problem List   Diagnosis Date Noted  . Essential hypertension 06/18/2017  . GERD (gastroesophageal reflux disease) 06/18/2017  . Depression with anxiety 06/18/2017  . Sepsis ([place]) 06/17/2017  . UTI (urinary tract infection) 06/17/2017  . AKI (acute kidney injury) ([place]) 06/17/2017  . Acute metabolic encephalopathy 07/31/9483  . Weakness 04/24/2017  . Diabetes mellitus without complication ([place]) 46/27/0350  . CAP (community acquired pneumonia) 04/24/2017  . HLD (hyperlipidemia) 04/24/2017  . Gout 04/24/2017  . Coronary artery calcification  12/20/2012  . Multiple lung nodules 10/25/2012  . Chronic cough 10/25/2012    Past Surgical History:  Procedure Laterality Date  . HERNIA REPAIR    . TOTAL ABDOMINAL HYSTERECTOMY    . TUMOR REMOVAL       OB History   None      Home Medications    Prior to Admission medications   Medication Sig Start Date End Date Taking? Authorizing Provider  acetaminophen (TYLENOL) 500 MG tablet Take 500 mg by mouth every 6 (six) hours as needed (for pain or headaches).   Yes [provider]  Cholecalciferol (VITAMIN D-3) 1000 units CAPS Take 1,000 Units by mouth daily.   Yes [provider]  DULoxetine (CYMBALTA) 20 MG capsule Take 1 capsule (20 mg total) by mouth daily. 06/21/17  Yes Shahmehdi, Seyed A, MD  fexofenadine (ALLEGRA) 180 MG tablet Take 180 mg by mouth daily.    Yes [provider]  gabapentin (NEURONTIN) 300 MG capsule Take 300-600 mg by mouth See admin instructions. Take 300 mg by mouth in the morning and 600 mg at bedtime 03/18/17  Yes [provider]  glipiZIDE (GLUCOTROL) 10 MG tablet Take 10 mg by mouth 2 (two) times daily before a meal.  06/30/12  Yes [provider]  losartan (COZAAR) 50 MG tablet Take 50 mg by mouth daily. 06/17/17  Yes [provider]  metFORMIN (GLUCOPHAGE) 1000 MG tablet Take 1,000 mg  by mouth 2 (two) times daily. 02/10/17  Yes [provider]  Multiple Vitamins-Minerals (MULTIVITAMIN WITH MINERALS) tablet Take 1 tablet by mouth daily.   Yes [provider]  potassium chloride SA (KLOR-CON M20) 20 MEQ tablet Take 20 mEq by mouth 2 (two) times daily.  08/06/12  Yes [provider]  pravastatin (PRAVACHOL) 40 MG tablet Take 40 mg by mouth daily.  08/18/12  Yes [provider]  Pumpkin Seed-Soy Germ (AZO BLADDER CONTROL/GO-LESS) CAPS Take 2 capsules by mouth at bedtime as needed (when bladder issues arise).    Yes [provider]  ranitidine (ZANTAC) 300 MG tablet Take  300 mg by mouth daily.   Yes [provider]  torsemide (DEMADEX) 20 MG tablet Take 20 mg by mouth daily.   Yes [provider]  ALPRAZolam (XANAX) 0.25 MG tablet Take 0.25 mg by mouth 3 (three) times daily as needed for anxiety. 06/10/17   [provider]  aspirin 325 MG tablet Take 325 mg by mouth daily.     [provider]    Family History Family History  Problem Relation Age of Onset  . Heart disease Father   . Diabetes Mother     Social History Social History   Tobacco Use  . Smoking status: Never Smoker  . Smokeless tobacco: Never Used  Substance Use Topics  . Alcohol use: No  . Drug use: No     Allergies   Oxycodone; Penicillins; and Tramadol   Review of Systems Review of Systems  All other systems reviewed and are negative.    Physical Exam Updated Vital Signs BP 140/79 (BP Location: Right Arm)   Pulse 69   Temp 97.9 F (36.6 C) (Oral)   Resp 18   Ht 5\' 2"  (1.575 m)   Wt 85.7 kg (189 lb)   SpO2 96%   BMI 34.57 kg/m   Physical Exam  Constitutional: She is oriented to person, place, and time. She appears well-developed. She appears distressed (Uncomfortable).  Elderly, obese  HENT:  Head: Normocephalic and atraumatic.  Eyes: Pupils are equal, round, and reactive to light. Conjunctivae and EOM are normal.  Neck: Normal range of motion and phonation normal. Neck supple.  Cardiovascular: Normal rate and regular rhythm.  Pulmonary/Chest: Effort normal and breath sounds normal. She exhibits no tenderness.  Abdominal: Soft. She exhibits no distension. There is no tenderness. There is no guarding.  Musculoskeletal:  Mild left lower back pain to palpation.  No tenderness along the thoracic or lumbar spine regions.  Decreased motion left hip secondary to pain.  This occurs both actively and passively.  Fair range of motion right hip.  Neurological: She is alert and oriented to person, place, and time. She exhibits normal  muscle tone.  Skin: Skin is warm and dry.  Psychiatric: She has a normal mood and affect. Her behavior is normal. Judgment and thought content normal.  Nursing note and vitals reviewed.    ED Treatments / Results  Labs (all labs ordered are listed, but only abnormal results are displayed) Labs Reviewed  BASIC METABOLIC PANEL - Abnormal; Notable for the following components:      Result Value   Glucose, Bld 104 (*)    All other components within normal limits  CBC WITH DIFFERENTIAL/PLATELET - Abnormal; Notable for the following components:   RBC 3.81 (*)    All other components within normal limits  URINALYSIS, ROUTINE W REFLEX MICROSCOPIC - Abnormal; Notable for the following components:  Color, Urine STRAW (*)    All other components within normal limits  CBG MONITORING, ED  I-STAT CG4 LACTIC ACID, ED  I-STAT CG4 LACTIC ACID, ED    EKG None  Radiology Dg Chest 2 View  Result Date: 07/02/2017 CLINICAL DATA:  Hypoglycemia.  Bilateral hip and back pain. EXAM: CHEST - 2 VIEW COMPARISON:  Chest x-ray of Jun 17, 2017 FINDINGS: The lungs are reasonably well inflated. The right hemidiaphragm is slightly higher than the left which is a chronic finding. There is no alveolar infiltrate or pleural effusion. The heart is top-normal in size. The pulmonary vascularity is normal. There are degenerative changes of both shoulders. There is calcification in the wall of the thoracic aorta. IMPRESSION: Chronic bronchitic changes, stable. Stable mild cardiomegaly without pulmonary edema. Thoracic aortic atherosclerosis. Electronically Signed   By: David  Martinique M.D.   On: 07/02/2017 11:26   Dg Lumbar Spine Complete  Result Date: 07/02/2017 CLINICAL DATA:  Bilateral hip and back pain; unable to get out bed this morning. The patient is normally ambulatory with a walker. No known injury. EXAM: LUMBAR SPINE - COMPLETE 4+ VIEW COMPARISON:  No recent studies in [facility] FINDINGS: The lumbar vertebral bodies  are preserved in height. There is subjective diffuse osteopenia. There is mild disc space narrowing at L4-5 and moderate narrowing at L5-S1. There is facet joint hypertrophy at L4-5 and L5-S1. There is no spondylolisthesis. There is calcification in the wall of the abdominal aorta. IMPRESSION: Mild degenerative disc space narrowing at L4-5 with moderate narrowing at L5-S1. Facet joint hypertrophy at these levels. The findings are consistent with osteoarthritis. No compression fracture. Abdominal aortic atherosclerosis. Electronically Signed   By: David  Martinique M.D.   On: 07/02/2017 11:28    Procedures Procedures (including critical care time)  Medications Ordered in ED Medications  0.9 %  sodium chloride infusion ( Intravenous New Bag/Given 07/02/17 1043)  sodium chloride 0.9 % bolus 500 mL (0 mLs Intravenous Stopped 07/02/17 1211)  ketorolac (TORADOL) 30 MG/ML injection 15 mg (15 mg Intravenous Given 07/02/17 1040)     Initial Impression / Assessment and Plan / ED Course  I have reviewed the triage vital signs and the nursing notes.  Pertinent labs & imaging results that were available during my care of the patient were reviewed by me and considered in my medical decision making (see chart for details).  Clinical Course as of Jul 03 1346  Wed Jul 02, 2017  1011 Initial evaluation consistent with low back pain due to degenerative joint disease.  Incidental and unexpected hypoglycemia, cause not clear.  Will evaluate for infectious processes that can sometimes lower blood sugar.  Doubt sepsis, patient is hemodynamically stable.   [EW]  1338 Normal  I-Stat CG4 Lactic Acid, ED [EW]  1339 Normal  CBC with Differential(!) [EW]  1339 CBG monitoring, ED [EW]  1339 Normal  CBG monitoring, ED [EW]  1339 Normal except glucose high 782  Basic metabolic panel(!) [EW]  9562 Normal  Urinalysis, Routine w reflex microscopic(!) [EW]  1339 DJD, consistent with site of pain, images reviewed  DG Lumbar  Spine Complete [EW]  1340 DG Chest 2 View [EW]  1340 Chronic changes, images reviewed  DG Chest 2 View [EW]    Clinical Course User Index [EW] Daleen Bo, MD     Patient Vitals for the past 24 hrs:  BP Temp Temp src Pulse Resp SpO2 Height Weight  07/02/17 1330 140/79 - - 69 18 96 % - -  07/02/17 1313 (!) 147/79 - - 68 18 100 % - -  07/02/17 1030 118/74 - - 65 18 99 % - -  07/02/17 1001 102/74 - - 87 18 97 % - -  07/02/17 0900 108/64 - - 68 18 100 % - -  07/02/17 0840 - - - 71 - 98 % - -  07/02/17 0839 105/60 97.9 F (36.6 C) Oral 69 18 100 % 5\' 2"  (1.575 m) 85.7 kg (189 lb)  07/02/17 0837 105/60 - - - - - - -  07/02/17 0835 - - - - - 98 % - -    1:45 PM Reevaluation with update and discussion. After initial assessment and treatment, an updated evaluation reveals she is feeling better at this time, has no further complaints.Daleen Bo   Medical Decision Making: Back pain with degenerative changes, likely source.  Doubt fracture, lumbar myelopathy or significant radiculopathy.  Incidental episode of low blood sugar, on oral hypoglycemic agents, without vomiting, or acute medical illness.  No indication for changing medications at this time.  CRITICAL CARE-no Performed by: Daleen Bo   Nursing Notes Reviewed/ Care Coordinated Applicable Imaging Reviewed Interpretation of Laboratory Data incorporated into ED treatment  The patient appears reasonably screened and/or stabilized for discharge and I doubt any other medical condition or other [facility] requiring further screening, evaluation, or treatment in the ED at this time prior to discharge.  Plan: Home Medications-continue usual medication, try ibuprofen to help control pain; Home Treatments-rest, fluids, 3 meals a day; return here if the recommended treatment, does not improve the symptoms; Recommended follow up-PCP follow-up for ongoing management and treatment.    Final Clinical Impressions(s) / ED Diagnoses   Final  diagnoses:  Spondylosis of lumbar region without myelopathy or radiculopathy  Hypoglycemia    ED Discharge Orders    None       Daleen Bo, MD 07/02/17 1348

## 2017-07-02 NOTE — ED Notes (Signed)
Bed: XU38 Expected date:  Expected time:  Means of arrival:  Comments: EMS/hypoglycemia-resolved

## 2017-07-02 NOTE — ED Notes (Signed)
Patient denies back pain. Patient states her hip pain is chronic.

## 2017-07-02 NOTE — Discharge Instructions (Signed)
There were no serious problems found today, your back pain is likely related to arthritis.  To help the discomfort take ibuprofen 400 mg 2 or 3 times a day along with Tylenol for pain.  Make sure that you are eating 3 meals a day to improve your blood sugar.  Follow-up with your medical doctor soon as you can for further evaluation and treatment.

## 2017-07-02 NOTE — ED Triage Notes (Signed)
[facility] pt from home with bilateral hip pain and back pain. Was not able to get out of the bed this morning when she is normally ambulatory with a walker. CBG was 42 on EMS arrival to home. Pineapple juice was given and now it is 80. Pain is 10/10 while moving and is not related to any injury.

## 2017-07-03 ENCOUNTER — Other Ambulatory Visit: Payer: Self-pay | Admitting: *Deleted

## 2017-07-03 ENCOUNTER — Ambulatory Visit: Payer: Self-pay | Admitting: *Deleted

## 2017-07-03 NOTE — Patient Outreach (Signed)
Transition of care call in lieu of home visit which was scheduled today. I spoke with [name] and advised her that Jacqlyn Larsen, RN, is out of the office for bereavement. I advised that I am covering for her. I noted that pt had been in the ED yesterday. [name] said she went in for hypoglycemia CBG was 42 at 5:30 am. She also complained of back and hip pain. They treated her for both conditions and discharged her. No medication changes were made. [name] told me she has had some low readings lately and she thinks maybe she is taking too much diabetic medication. I see she is on glipizide 10 mg bid and metformin 1000 mg bid. Mrs. Amble has an MD appt on 07/17/17.  I have instructed [name] to stop the glipizide until they see Vicenta Aly her primary care provider on June 13th. They are to check the FBS and 4 PM glucose and record the readings and bring this with them to the MD appt. I am advising the MD of this intervention.  Jacqlyn Larsen will be in touch next week for follow up and [name] has my number for any questions in the mean time.  Eulah Pont. Myrtie Neither, MSN, [facility] Gerontological Nurse Practitioner [facility] Care Management [phone]

## 2017-07-07 DIAGNOSIS — Z7982 Long term (current) use of aspirin: Secondary | ICD-10-CM | POA: Diagnosis not present

## 2017-07-07 DIAGNOSIS — I10 Essential (primary) hypertension: Secondary | ICD-10-CM | POA: Diagnosis not present

## 2017-07-07 DIAGNOSIS — M109 Gout, unspecified: Secondary | ICD-10-CM | POA: Diagnosis not present

## 2017-07-07 DIAGNOSIS — E669 Obesity, unspecified: Secondary | ICD-10-CM | POA: Diagnosis not present

## 2017-07-07 DIAGNOSIS — E559 Vitamin D deficiency, unspecified: Secondary | ICD-10-CM | POA: Diagnosis not present

## 2017-07-07 DIAGNOSIS — G4700 Insomnia, unspecified: Secondary | ICD-10-CM | POA: Diagnosis not present

## 2017-07-07 DIAGNOSIS — Z9181 History of falling: Secondary | ICD-10-CM | POA: Diagnosis not present

## 2017-07-07 DIAGNOSIS — E119 Type 2 diabetes mellitus without complications: Secondary | ICD-10-CM | POA: Diagnosis not present

## 2017-07-07 DIAGNOSIS — M1990 Unspecified osteoarthritis, unspecified site: Secondary | ICD-10-CM | POA: Diagnosis not present

## 2017-07-07 DIAGNOSIS — Z7984 Long term (current) use of oral hypoglycemic drugs: Secondary | ICD-10-CM | POA: Diagnosis not present

## 2017-07-07 DIAGNOSIS — J189 Pneumonia, unspecified organism: Secondary | ICD-10-CM | POA: Diagnosis not present

## 2017-07-07 DIAGNOSIS — M6281 Muscle weakness (generalized): Secondary | ICD-10-CM | POA: Diagnosis not present

## 2017-07-07 DIAGNOSIS — K219 Gastro-esophageal reflux disease without esophagitis: Secondary | ICD-10-CM | POA: Diagnosis not present

## 2017-07-08 ENCOUNTER — Other Ambulatory Visit: Payer: Self-pay | Admitting: *Deleted

## 2017-07-08 NOTE — Patient Outreach (Addendum)
Telephone call to reschedule home visit and Transition of care week 3, spoke with patient's daughter [name] and rescheduled home visit for next week.  [name] reports " she's doing ok, blood sugar is better since not on glipizide"  Fasting CBG today 105, last night 157, daughter reports "most readings in 100's range"  Pt does not always check CBG BID "it depends if she remembers it, but we're trying to help with this"  [name] reports pt has all medications and taking as prescribed, continues to be off glipizide and has had no hypoglycemic episodes.  THN CM Care Plan Problem One     Most Recent Value  Care Plan Problem One  Knowledge deficit related to pneumonia  Role Documenting the Problem One  Care Management Coordinator  Care Plan for Problem One  Active  THN Long Term Goal   Pt will verbalize and demonstrate improved self care for prevention of pneumonia and other respiratory illnesses within 60 days  THN Long Term Goal Start Date  05/23/17  Interventions for Problem One Long Term Goal  RN CM reviewed signs/ symptoms infection, correlation of infection to blood sugar  THN CM Short Term Goal #1   Pt will verbalize ways to prevent respiratory infections within 30 days  THN CM Short Term Goal #1 Start Date  05/23/17  [facility] CM Short Term Goal #1 Met Date  06/23/17  THN CM Short Term Goal #2   Pt will take all medications as prescribed within 30 days  THN CM Short Term Goal #2 Start Date  06/02/17  [facility] CM Short Term Goal #2 Met Date  06/23/17    [facility] CM Care Plan Problem Two     Most Recent Value  Care Plan Problem Two  Pt high risk for falls  Role Documenting the Problem Two  Care Management Coordinator  Care Plan for Problem Two  Active  THN CM Short Term Goal #1   Pt will work with home health PT to increase endurance, stamina within 30 days  THN CM Short Term Goal #1 Start Date  06/02/17  Interventions for Short Term Goal #2   RN CM reviewed safety precautions    THN CM Care Plan Problem  Three     Most Recent Value  Care Plan Problem Three  Pt needs more oversight by family and privately paid caregivers.  Role Documenting the Problem Three  Care Management Coordinator  Care Plan for Problem Three  Active  THN CM Short Term Goal #1   Family will hire caregiver to asssit with the care of their mother for times that they are not able to cover themselves within the next 30 days.  Interventions for Short Term Goal #1  Family has not hired help presently,  daughter has been assisting pt      PLAN See pt for home visit next week  Jacqlyn Larsen [facility], [place] Coordinator [phone]

## 2017-07-10 ENCOUNTER — Other Ambulatory Visit: Payer: Self-pay | Admitting: *Deleted

## 2017-07-10 ENCOUNTER — Encounter: Payer: Self-pay | Admitting: *Deleted

## 2017-07-10 NOTE — Patient Outreach (Signed)
Telephone call to patient for transition of care week 3, no answer to telephone, no option to leave voicemail.  PLAN See pt for home visit next week Continue transition of care  Jacqlyn Larsen [facility], [place] Coordinator [phone]

## 2017-07-16 ENCOUNTER — Other Ambulatory Visit: Payer: Self-pay | Admitting: Licensed Clinical Social Worker

## 2017-07-16 ENCOUNTER — Encounter: Payer: Self-pay | Admitting: *Deleted

## 2017-07-16 ENCOUNTER — Other Ambulatory Visit: Payer: Self-pay | Admitting: *Deleted

## 2017-07-16 DIAGNOSIS — E1169 Type 2 diabetes mellitus with other specified complication: Secondary | ICD-10-CM | POA: Diagnosis not present

## 2017-07-16 DIAGNOSIS — F4321 Adjustment disorder with depressed mood: Secondary | ICD-10-CM | POA: Diagnosis not present

## 2017-07-16 DIAGNOSIS — M150 Primary generalized (osteo)arthritis: Secondary | ICD-10-CM | POA: Diagnosis not present

## 2017-07-16 DIAGNOSIS — R410 Disorientation, unspecified: Secondary | ICD-10-CM | POA: Diagnosis not present

## 2017-07-16 DIAGNOSIS — R112 Nausea with vomiting, unspecified: Secondary | ICD-10-CM | POA: Diagnosis not present

## 2017-07-16 DIAGNOSIS — N390 Urinary tract infection, site not specified: Secondary | ICD-10-CM | POA: Diagnosis not present

## 2017-07-16 DIAGNOSIS — R54 Age-related physical debility: Secondary | ICD-10-CM | POA: Diagnosis not present

## 2017-07-16 NOTE — Patient Outreach (Signed)
[facility]) Care Management   07/16/2017  Maty Zeisler [facility] 12/19/1934 [id number]  Melissa Dalton is an 83 y.o. female  Subjective: Routine home visit with pt, HIPAA verified, daughter Melissa Dalton present at end of visit, pt gave incorrect answers to most questions (per daughter),  Melissa Dalton reports CBG is being checking daily but not recorded, since being off glipizide pt has had no hypoglycemic episodes and most readings 150-200 range.  Pt now has more assistance in the home.  Pt saw primary care MD today and diagnosed with urinary tract infection and on antibiotic (to be picked up from pharmacy today, was not ready)  Pt states her appetite has not been as good since her husband passed away, pt is eating cherries during visit, daughter reports " she drinks plenty of water", daughter offering small frequent meals.   Objective:   Vitals:   07/16/17 1404  BP: 108/66  Pulse: 92  Resp: 18  SpO2: 93%  Weight: 189 lb (85.7 kg)  CBG ranges 150-200's per daughter, pt is not recording 7 day average 174 14 day average 154 30 day average 142  ROS  Physical Exam  Constitutional: She is oriented to person, place, and time. She appears well-developed.  HENT:  Head: Normocephalic.  Neck: Normal range of motion.  Cardiovascular: Normal rate.  Respiratory: Effort normal and breath sounds normal.  GI: Soft. Bowel sounds are normal.  Musculoskeletal: Normal range of motion. She exhibits no edema.  Neurological: She is alert and oriented to person, place, and time.  Skin: Skin is warm and dry.  Psychiatric: She has a normal mood and affect. Her behavior is normal.  Pt is forgetful, confused at times    Encounter Medications:   Outpatient Encounter Medications as of 07/16/2017  Medication Sig Note  . acetaminophen (TYLENOL) 500 MG tablet Take 500 mg by mouth every 6 (six) hours as needed (for pain or headaches).   . ALPRAZolam (XANAX) 0.25 MG tablet Take 0.25 mg by mouth 3 (three)  times daily as needed for anxiety. 06/23/2017: Not taking, was given at time of husband's death. Advised daughter to lkeep this medication locked in a safe place to avoid diversion.  Marland Kitchen aspirin 325 MG tablet Take 325 mg by mouth daily.  06/17/2017: ECOTRIN  . Cholecalciferol (VITAMIN D-3) 1000 units CAPS Take 1,000 Units by mouth daily.   . DULoxetine (CYMBALTA) 20 MG capsule Take 1 capsule (20 mg total) by mouth daily.   . fexofenadine (ALLEGRA) 180 MG tablet Take 180 mg by mouth daily.    Marland Kitchen gabapentin (NEURONTIN) 300 MG capsule Take 300-600 mg by mouth See admin instructions. Take 300 mg by mouth in the morning and 600 mg at bedtime   . losartan (COZAAR) 50 MG tablet Take 50 mg by mouth daily.   . metFORMIN (GLUCOPHAGE) 1000 MG tablet Take 1,000 mg by mouth 2 (two) times daily.   . Multiple Vitamins-Minerals (MULTIVITAMIN WITH MINERALS) tablet Take 1 tablet by mouth daily.   . potassium chloride SA (KLOR-CON M20) 20 MEQ tablet Take 20 mEq by mouth 2 (two) times daily.    . pravastatin (PRAVACHOL) 40 MG tablet Take 40 mg by mouth daily.  06/23/2017: Suggested this medicaton is best taken in the evening.  Marland Kitchen Pumpkin Seed-Soy Germ (AZO BLADDER CONTROL/GO-LESS) CAPS Take 2 capsules by mouth at bedtime as needed (when bladder issues arise).    . ranitidine (ZANTAC) 300 MG tablet Take 300 mg by mouth daily.   Marland Kitchen glipiZIDE (GLUCOTROL)  10 MG tablet Take 10 mg by mouth 2 (two) times daily before a meal.    . torsemide (DEMADEX) 20 MG tablet Take 20 mg by mouth daily. 06/23/2017: Daughter states she only takes this if she has pedal edema.   No facility-administered encounter medications on file as of 07/16/2017.     Functional Status:   In your present state of health, do you have any difficulty performing the following activities: 06/25/2017 06/18/2017  Hearing? N N  Vision? N N  Difficulty concentrating or making decisions? N N  Walking or climbing stairs? Y Y  Dressing or bathing? Y Y  Doing errands,  shopping? Tempie Donning  Preparing Food and eating ? Y -  Using the Toilet? N -  In the past six months, have you accidently leaked urine? N -  Do you have problems with loss of bowel control? N -  Managing your Medications? Y -  Managing your Finances? Y -  Housekeeping or managing your Housekeeping? Y -  Some recent data might be hidden    Fall/Depression Screening:    Fall Risk  06/25/2017 06/02/2017  Falls in the past year? No Yes  Number falls in past yr: - 1  Injury with Fall? - No  Risk for fall due to : Impaired balance/gait;Impaired mobility History of fall(s);Medication side effect  Follow up - Falls evaluation completed;Falls prevention discussed   PHQ 2/9 Scores 06/25/2017 06/02/2017  PHQ - 2 Score 2 0  PHQ- 9 Score 6 -    Assessment:  RN CM emphasized safety precautions, signs/ symptoms infection and prevention of urinary tract infection,  Importance of drinking adequate fluids, including adequate protein, fresh fruits and vegetables in diet.  THN CM Care Plan Problem One     Most Recent Value  Care Plan Problem One  Knowledge deficit related to pneumonia  Role Documenting the Problem One  Care Management Coordinator  Care Plan for Problem One  Active  THN Long Term Goal   Pt will verbalize and demonstrate improved self care for prevention of pneumonia and other respiratory illnesses within 60 days  THN Long Term Goal Start Date  05/23/17  Interventions for Problem One Long Term Goal  RN CM reviewed medications with pt and daughter, pt does have assistance in the afternoon hours of 530-10 pm, daughter stays with pt most of day and pt sleeps at the house alone.  RN CM reviewed signs/ symptoms infection, respiratory illness.  (Significant)   THN CM Short Term Goal #1   Pt will verbalize ways to prevent respiratory infections within 30 days  THN CM Short Term Goal #1 Start Date  05/23/17  [facility] CM Short Term Goal #1 Met Date  06/23/17  THN CM Short Term Goal #2   Pt will take all  medications as prescribed within 30 days  THN CM Short Term Goal #2 Start Date  06/02/17  [facility] CM Short Term Goal #2 Met Date  06/23/17    [facility] CM Care Plan Problem Two     Most Recent Value  Care Plan Problem Two  Pt high risk for falls  Role Documenting the Problem Two  Care Management Coordinator  Care Plan for Problem Two  Active  THN CM Short Term Goal #1   Pt will work with home health PT to increase endurance, stamina within 30 days  THN CM Short Term Goal #1 Start Date  06/02/17  [facility] CM Short Term Goal #1 Met Date   07/16/17  Interventions for Short Term Goal #2   RN CM reviewed safety precautions, ask pt to use walker at all times, reviewed benefits of obtaining LIfe Alert necklace, pt and daughter do not seem to be interested    [facility] CM Care Plan Problem Three     Most Recent Value  Care Plan Problem Three  Pt needs more oversight by family and privately paid caregivers.  Role Documenting the Problem Three  Care Management Coordinator  Care Plan for Problem Three  Active  THN CM Short Term Goal #1   Family will hire caregiver to asssit with the care of their mother for times that they are not able to cover themselves within the next 30 days.  THN CM Short Term Goal #1 Met Date  07/16/17  Interventions for Short Term Goal #1  pt now has assistance in the evening      Plan: see pt for home visit next month  Jacqlyn Larsen [facility], [place] Coordinator [phone]

## 2017-07-16 NOTE — Patient Outreach (Signed)
Assessment:  CSW spoke via phone with client . CSW verified client identity. CSW received verbal permission from client for CSW to speak with client about client needs. CSW spoke with client about client care plan. CSW encouraged client to communicate with CSW in next 30 days to discuss grief issues of client and grief symptoms experienced by client over the recent death of spouse of client. Client takes prescribed medications as scheduled.  Client likes to sit on her porch and look outdoors as a means of relaxation. CSW suggested that client spend time relaxing on her porch as a way to manage stress and grief symptoms. Client has support from her daughters. Client has had weight loss and has talked with her medical provider about weight loss of client.  Client is receiving [facility] nursing support with RN Jacqlyn Larsen.  CSW informed client of [facility] program support in nursing, social work and pharmacy.  CSW thanked client for phone call with CSW on 07/16/17. CSW encouraged that client or her daughters call CSW as needed to discuss social work needs of client.   Plan:  Client to talk with CSW in next 30 days to discuss grief issues of client and grief symptoms experienced by client over the recent death of spouse of client.    CSW to call client or daughter of client in 4 weeks to assess client needs at that time.  [name].Jaylynn Siefert MSW, LCSW Licensed Clinical Social Worker [facility] Care Management [phone]

## 2017-07-22 DIAGNOSIS — K449 Diaphragmatic hernia without obstruction or gangrene: Secondary | ICD-10-CM | POA: Diagnosis not present

## 2017-07-22 DIAGNOSIS — E1151 Type 2 diabetes mellitus with diabetic peripheral angiopathy without gangrene: Secondary | ICD-10-CM | POA: Diagnosis not present

## 2017-07-22 DIAGNOSIS — F4321 Adjustment disorder with depressed mood: Secondary | ICD-10-CM | POA: Diagnosis not present

## 2017-07-22 DIAGNOSIS — E1142 Type 2 diabetes mellitus with diabetic polyneuropathy: Secondary | ICD-10-CM | POA: Diagnosis not present

## 2017-07-22 DIAGNOSIS — R54 Age-related physical debility: Secondary | ICD-10-CM | POA: Diagnosis not present

## 2017-07-22 DIAGNOSIS — M150 Primary generalized (osteo)arthritis: Secondary | ICD-10-CM | POA: Diagnosis not present

## 2017-07-22 DIAGNOSIS — Z7984 Long term (current) use of oral hypoglycemic drugs: Secondary | ICD-10-CM | POA: Diagnosis not present

## 2017-07-22 DIAGNOSIS — Z87440 Personal history of urinary (tract) infections: Secondary | ICD-10-CM | POA: Diagnosis not present

## 2017-07-23 DIAGNOSIS — Z9181 History of falling: Secondary | ICD-10-CM | POA: Diagnosis not present

## 2017-07-23 DIAGNOSIS — M109 Gout, unspecified: Secondary | ICD-10-CM | POA: Diagnosis not present

## 2017-07-23 DIAGNOSIS — M1990 Unspecified osteoarthritis, unspecified site: Secondary | ICD-10-CM | POA: Diagnosis not present

## 2017-07-23 DIAGNOSIS — R278 Other lack of coordination: Secondary | ICD-10-CM | POA: Diagnosis not present

## 2017-07-23 DIAGNOSIS — M6281 Muscle weakness (generalized): Secondary | ICD-10-CM | POA: Diagnosis not present

## 2017-08-02 ENCOUNTER — Other Ambulatory Visit: Payer: Self-pay

## 2017-08-02 ENCOUNTER — Emergency Department (HOSPITAL_COMMUNITY): Payer: PPO

## 2017-08-02 ENCOUNTER — Encounter (HOSPITAL_COMMUNITY): Payer: Self-pay

## 2017-08-02 ENCOUNTER — Inpatient Hospital Stay (HOSPITAL_COMMUNITY)
Admission: EM | Admit: 2017-08-02 | Discharge: 2017-08-04 | DRG: 683 | Disposition: A | Discharge status: Home Health | Payer: PPO | Attending: Internal Medicine | Admitting: Internal Medicine

## 2017-08-02 DIAGNOSIS — F0390 Unspecified dementia without behavioral disturbance: Secondary | ICD-10-CM | POA: Diagnosis not present

## 2017-08-02 DIAGNOSIS — Z7401 Bed confinement status: Secondary | ICD-10-CM | POA: Diagnosis not present

## 2017-08-02 DIAGNOSIS — I10 Essential (primary) hypertension: Secondary | ICD-10-CM | POA: Diagnosis not present

## 2017-08-02 DIAGNOSIS — N179 Acute kidney failure, unspecified: Principal | ICD-10-CM | POA: Diagnosis present

## 2017-08-02 DIAGNOSIS — R627 Adult failure to thrive: Secondary | ICD-10-CM | POA: Diagnosis present

## 2017-08-02 DIAGNOSIS — F4321 Adjustment disorder with depressed mood: Secondary | ICD-10-CM | POA: Diagnosis not present

## 2017-08-02 DIAGNOSIS — Z880 Allergy status to penicillin: Secondary | ICD-10-CM | POA: Diagnosis not present

## 2017-08-02 DIAGNOSIS — E860 Dehydration: Secondary | ICD-10-CM | POA: Diagnosis not present

## 2017-08-02 DIAGNOSIS — F0391 Unspecified dementia with behavioral disturbance: Secondary | ICD-10-CM | POA: Diagnosis not present

## 2017-08-02 DIAGNOSIS — E118 Type 2 diabetes mellitus with unspecified complications: Secondary | ICD-10-CM | POA: Diagnosis not present

## 2017-08-02 DIAGNOSIS — Z7982 Long term (current) use of aspirin: Secondary | ICD-10-CM | POA: Diagnosis not present

## 2017-08-02 DIAGNOSIS — E785 Hyperlipidemia, unspecified: Secondary | ICD-10-CM | POA: Diagnosis not present

## 2017-08-02 DIAGNOSIS — R1110 Vomiting, unspecified: Secondary | ICD-10-CM | POA: Diagnosis not present

## 2017-08-02 DIAGNOSIS — R0602 Shortness of breath: Secondary | ICD-10-CM | POA: Diagnosis not present

## 2017-08-02 DIAGNOSIS — Z886 Allergy status to analgesic agent status: Secondary | ICD-10-CM | POA: Diagnosis not present

## 2017-08-02 DIAGNOSIS — F332 Major depressive disorder, recurrent severe without psychotic features: Secondary | ICD-10-CM | POA: Diagnosis not present

## 2017-08-02 DIAGNOSIS — E7800 Pure hypercholesterolemia, unspecified: Secondary | ICD-10-CM | POA: Diagnosis present

## 2017-08-02 DIAGNOSIS — Z634 Disappearance and death of family member: Secondary | ICD-10-CM | POA: Diagnosis not present

## 2017-08-02 DIAGNOSIS — K219 Gastro-esophageal reflux disease without esophagitis: Secondary | ICD-10-CM | POA: Diagnosis not present

## 2017-08-02 DIAGNOSIS — Z7984 Long term (current) use of oral hypoglycemic drugs: Secondary | ICD-10-CM

## 2017-08-02 DIAGNOSIS — F329 Major depressive disorder, single episode, unspecified: Secondary | ICD-10-CM | POA: Diagnosis not present

## 2017-08-02 DIAGNOSIS — E119 Type 2 diabetes mellitus without complications: Secondary | ICD-10-CM | POA: Diagnosis present

## 2017-08-02 DIAGNOSIS — F419 Anxiety disorder, unspecified: Secondary | ICD-10-CM | POA: Diagnosis not present

## 2017-08-02 DIAGNOSIS — Z79899 Other long term (current) drug therapy: Secondary | ICD-10-CM | POA: Diagnosis not present

## 2017-08-02 DIAGNOSIS — R531 Weakness: Secondary | ICD-10-CM | POA: Diagnosis not present

## 2017-08-02 LAB — NA AND K (SODIUM & POTASSIUM), RAND UR
Potassium Urine: 66 mmol/L
Sodium, Ur: <10 mmol/L

## 2017-08-02 LAB — URINALYSIS, ROUTINE W REFLEX MICROSCOPIC
Bilirubin Urine: NEGATIVE
GLUCOSE, UA: NEGATIVE mg/dL
Ketones, ur: NEGATIVE mg/dL
NITRITE: NEGATIVE
PROTEIN: NEGATIVE mg/dL
SPECIFIC GRAVITY, URINE: 1.015 (ref 1.005–1.030)
pH: 5 (ref 5.0–8.0)

## 2017-08-02 LAB — GLUCOSE, CAPILLARY
GLUCOSE-CAPILLARY: 97 mg/dL (ref 70–99)
Glucose-Capillary: 75 mg/dL (ref 70–99)

## 2017-08-02 LAB — CREATININE, URINE, RANDOM: Creatinine, Urine: 147.42 mg/dL

## 2017-08-02 LAB — TSH: TSH: 1.066 u[IU]/mL (ref 0.350–4.500)

## 2017-08-02 LAB — CBC
HCT: 40.6 % (ref 36.0–46.0)
HEMOGLOBIN: 13.9 g/dL (ref 12.0–15.0)
MCH: 32.2 pg (ref 26.0–34.0)
MCHC: 34.2 g/dL (ref 30.0–36.0)
MCV: 94 fL (ref 78.0–100.0)
Platelets: 300 10*3/uL (ref 150–400)
RBC: 4.32 MIL/uL (ref 3.87–5.11)
RDW: 13.9 % (ref 11.5–15.5)
WBC: 11 10*3/uL — ABNORMAL HIGH (ref 4.0–10.5)

## 2017-08-02 LAB — COMPREHENSIVE METABOLIC PANEL
ALK PHOS: 46 U/L (ref 38–126)
ALT: 22 U/L (ref 0–44)
ANION GAP: 18 — AB (ref 5–15)
AST: 33 U/L (ref 15–41)
Albumin: 3.6 g/dL (ref 3.5–5.0)
BUN: 37 mg/dL — ABNORMAL HIGH (ref 8–23)
CALCIUM: 9.4 mg/dL (ref 8.9–10.3)
CO2: 18 mmol/L — AB (ref 22–32)
Chloride: 97 mmol/L — ABNORMAL LOW (ref 98–111)
Creatinine, Ser: 1.61 mg/dL — ABNORMAL HIGH (ref 0.44–1.00)
GFR calc non Af Amer: 29 mL/min — ABNORMAL LOW (ref >60)
GFR, EST AFRICAN AMERICAN: 34 mL/min — AB (ref >60)
Glucose, Bld: 139 mg/dL — ABNORMAL HIGH (ref 70–99)
POTASSIUM: 4.5 mmol/L (ref 3.5–5.1)
SODIUM: 133 mmol/L — AB (ref 135–145)
Total Bilirubin: 0.9 mg/dL (ref 0.3–1.2)
Total Protein: 6.8 g/dL (ref 6.5–8.1)

## 2017-08-02 LAB — I-STAT TROPONIN, ED: TROPONIN I, POC: 0.01 ng/mL (ref 0.00–0.08)

## 2017-08-02 LAB — VITAMIN B12: Vitamin B-12: 388 pg/mL (ref 180–914)

## 2017-08-02 LAB — LIPASE, BLOOD: Lipase: 26 U/L (ref 11–51)

## 2017-08-02 LAB — OSMOLALITY, URINE: Osmolality, Ur: 400 mOsm/kg (ref 300–900)

## 2017-08-02 MED ORDER — ONDANSETRON HCL 4 MG/2ML IJ SOLN: 4.0000 mg | Freq: Four times a day (QID) | INTRAMUSCULAR | Status: DC | PRN

## 2017-08-02 MED ORDER — ASPIRIN 325 MG PO TABS
325.0000 mg | ORAL_TABLET | Freq: Every day | ORAL | Status: DC
  Administered 2017-08-03 – 2017-08-04 (×2): 325 mg via ORAL
  Filled 2017-08-02 (×2): qty 1

## 2017-08-02 MED ORDER — PRAVASTATIN SODIUM 40 MG PO TABS
40.0000 mg | ORAL_TABLET | Freq: Every day | ORAL | Status: DC
  Administered 2017-08-02 – 2017-08-03 (×2): 40 mg via ORAL
  Filled 2017-08-02 (×2): qty 1

## 2017-08-02 MED ORDER — ACETAMINOPHEN 325 MG PO TABS: 650.0000 mg | ORAL_TABLET | Freq: Four times a day (QID) | ORAL | Status: DC | PRN

## 2017-08-02 MED ORDER — TRAZODONE HCL 50 MG PO TABS: 50.0000 mg | ORAL_TABLET | Freq: Every evening | ORAL | Status: DC | PRN

## 2017-08-02 MED ORDER — ONDANSETRON HCL 4 MG PO TABS: 4.0000 mg | ORAL_TABLET | Freq: Four times a day (QID) | ORAL | Status: DC | PRN

## 2017-08-02 MED ORDER — ACETAMINOPHEN 650 MG RE SUPP: 650.0000 mg | Freq: Four times a day (QID) | RECTAL | Status: DC | PRN

## 2017-08-02 MED ORDER — GLUCERNA SHAKE PO LIQD
237.0000 mL | Freq: Three times a day (TID) | ORAL | Status: DC
  Administered 2017-08-03 – 2017-08-04 (×4): 237 mL via ORAL
  Filled 2017-08-02 (×7): qty 237

## 2017-08-02 MED ORDER — SODIUM CHLORIDE 0.9 % IV SOLN
INTRAVENOUS | Status: DC
  Administered 2017-08-02 – 2017-08-04 (×4): via INTRAVENOUS

## 2017-08-02 MED ORDER — SENNOSIDES-DOCUSATE SODIUM 8.6-50 MG PO TABS
1.0000 | ORAL_TABLET | Freq: Every evening | ORAL | Status: DC | PRN
Start: 2017-08-02 — End: 2017-08-04

## 2017-08-02 MED ORDER — GABAPENTIN 300 MG PO CAPS
300.0000 mg | ORAL_CAPSULE | Freq: Every day | ORAL | Status: DC
  Administered 2017-08-03 – 2017-08-04 (×2): 300 mg via ORAL
  Filled 2017-08-02 (×2): qty 1

## 2017-08-02 MED ORDER — INSULIN ASPART 100 UNIT/ML ~~LOC~~ SOLN
0.0000 [IU] | Freq: Three times a day (TID) | SUBCUTANEOUS | Status: DC
  Administered 2017-08-03 – 2017-08-04 (×2): 3 [IU] via SUBCUTANEOUS
  Administered 2017-08-04: 2 [IU] via SUBCUTANEOUS

## 2017-08-02 MED ORDER — HEPARIN SODIUM (PORCINE) 5000 UNIT/ML IJ SOLN
5000.0000 [IU] | Freq: Three times a day (TID) | INTRAMUSCULAR | Status: DC
  Administered 2017-08-02 – 2017-08-04 (×6): 5000 [IU] via SUBCUTANEOUS
  Filled 2017-08-02 (×7): qty 1

## 2017-08-02 MED ORDER — MIRTAZAPINE 15 MG PO TABS
15.0000 mg | ORAL_TABLET | Freq: Every day | ORAL | Status: DC
  Administered 2017-08-02 – 2017-08-03 (×2): 15 mg via ORAL
  Filled 2017-08-02 (×2): qty 1

## 2017-08-02 MED ORDER — ENSURE ENLIVE PO LIQD
237.0000 mL | Freq: Two times a day (BID) | ORAL | Status: DC
  Administered 2017-08-03 (×2): 237 mL via ORAL

## 2017-08-02 MED ORDER — FAMOTIDINE 20 MG PO TABS
40.0000 mg | ORAL_TABLET | Freq: Every day | ORAL | Status: DC
  Administered 2017-08-02 – 2017-08-03 (×2): 40 mg via ORAL
  Filled 2017-08-02 (×2): qty 2

## 2017-08-02 MED ORDER — DULOXETINE HCL 20 MG PO CPEP
20.0000 mg | ORAL_CAPSULE | Freq: Every day | ORAL | Status: DC
  Administered 2017-08-03 – 2017-08-04 (×2): 20 mg via ORAL
  Filled 2017-08-02 (×2): qty 1

## 2017-08-02 MED ORDER — GABAPENTIN 300 MG PO CAPS
600.0000 mg | ORAL_CAPSULE | Freq: Every day | ORAL | Status: DC
  Administered 2017-08-02 – 2017-08-03 (×2): 600 mg via ORAL
  Filled 2017-08-02 (×2): qty 2

## 2017-08-02 MED ORDER — SODIUM CHLORIDE 0.9 % IV BOLUS
1000.0000 mL | Freq: Once | INTRAVENOUS | Status: AC
  Administered 2017-08-02: 1000 mL via INTRAVENOUS

## 2017-08-02 NOTE — ED Notes (Signed)
ED TO INPATIENT HANDOFF REPORT  Name/Age/Gender Melissa Dalton 83 y.o. female  Code Status    Code Status Orders  (From admission, onward)        Start     Ordered   08/02/17 1558  Full code  Continuous     08/02/17 1601    Code Status History    Date Active Date Inactive Code Status Order ID Comments User Context   06/17/2017 2316 06/20/2017 2012 Full Code [id number]  Ivor Costa, MD ED   04/24/2017 0035 04/29/2017 1532 Full Code [id number]  Rise Patience, MD ED      Home/SNF/Other Home  Chief Complaint emesis/weakness  Level of Care/Admitting Diagnosis ED Disposition    ED Disposition Condition [facility] Area: [facility] Acute [facility] [[id number]]  Level of Care: Med-Surg [16]  Diagnosis: AKI (acute kidney injury) [facility]) [202542]  Admitting Physician: Patrecia Pour, EDWIN [[id number]]  Attending Physician: Patrecia Pour, EDWIN [[id number]]  PT Class (Do Not Modify): Observation [104]  PT Acc Code (Do Not Modify): Observation [10022]       Medical History Past Medical History:  Diagnosis Date  . Anxiety   . Arthritis   . Complication of anesthesia    DIFFICULT TO WAKE AFTER HERNIA OPERATION  . Depression   . Diabetes ([place])   . High cholesterol   . HTN (hypertension)   . PONV (postoperative nausea and vomiting)   . UTI (urinary tract infection) 06/18/2017    Allergies Allergies  Allergen Reactions  . Oxycodone Nausea And Vomiting  . Penicillins Hives and Rash    Has patient had a PCN reaction causing immediate rash, facial/tongue/throat swelling, SOB or lightheadedness with hypotension: Yes Has patient had a PCN reaction causing severe rash involving mucus membranes or skin necrosis: No Has patient had a PCN reaction that required hospitalization: No Has patient had a PCN reaction occurring within the last 10 years: No If all of the above answers are "NO", then may proceed with Cephalosporin use.   . Tramadol Nausea And Vomiting     IV Location/Drains/Wounds Patient Lines/Drains/Airways Status   Active Line/Drains/Airways    Name:   Placement date:   Placement time:   Site:   Days:   Peripheral IV 08/02/17 Left Antecubital   08/02/17    1520    Antecubital   less than 1          Labs/Imaging Results for orders placed or performed during the hospital encounter of 08/02/17 (from the past 48 hour(s))  Lipase, blood     Status: None   Collection Time: 08/02/17  1:11 PM  Result Value Ref Range   Lipase 26 11 - 51 U/L    Comment: Performed at [facility], [place] [address] [place], [place] [address]  Comprehensive metabolic panel     Status: Abnormal   Collection Time: 08/02/17  1:11 PM  Result Value Ref Range   Sodium 133 (L) 135 - 145 mmol/L   Potassium 4.5 3.5 - 5.1 mmol/L   Chloride 97 (L) 98 - 111 mmol/L    Comment: Please note change in reference range.   CO2 18 (L) 22 - 32 mmol/L   Glucose, Bld 139 (H) 70 - 99 mg/dL    Comment: Please note change in reference range.   BUN 37 (H) 8 - 23 mg/dL    Comment: Please note change in reference range.   Creatinine, Ser 1.61 (H) 0.44 - 1.00 mg/dL  Calcium 9.4 8.9 - 10.3 mg/dL   Total Protein 6.8 6.5 - 8.1 g/dL   Albumin 3.6 3.5 - 5.0 g/dL   AST 33 15 - 41 U/L   ALT 22 0 - 44 U/L    Comment: Please note change in reference range.   Alkaline Phosphatase 46 38 - 126 U/L   Total Bilirubin 0.9 0.3 - 1.2 mg/dL   GFR calc non Af Amer 29 (L) >60 mL/min   GFR calc Af Amer 34 (L) >60 mL/min    Comment: (NOTE) The eGFR has been calculated using the CKD EPI equation. This calculation has not been validated in all clinical situations. eGFR's persistently <60 mL/min signify possible Chronic Kidney Disease.    Anion gap 18 (H) 5 - 15    Comment: Performed at [facility], [place] [address] [place], [place] [address]  CBC     Status: Abnormal   Collection Time: 08/02/17  1:11 PM  Result Value Ref Range   WBC 11.0 (H) 4.0 - 10.5  K/uL   RBC 4.32 3.87 - 5.11 MIL/uL   Hemoglobin 13.9 12.0 - 15.0 g/dL   HCT 40.6 36.0 - 46.0 %   MCV 94.0 78.0 - 100.0 fL   MCH 32.2 26.0 - 34.0 pg   MCHC 34.2 30.0 - 36.0 g/dL   RDW 13.9 11.5 - 15.5 %   Platelets 300 150 - 400 K/uL    Comment: Performed at [facility], [place] [address], [place],  [address]  I-stat troponin, ED     Status: None   Collection Time: 08/02/17  3:23 PM  Result Value Ref Range   Troponin i, poc 0.01 0.00 - 0.08 ng/mL   Comment 3            Comment: Due to the release kinetics of cTnI, a negative result within the first hours of the onset of symptoms does not rule out myocardial infarction with certainty. If myocardial infarction is still suspected, repeat the test at appropriate intervals.    Dg Chest Port 1 View  Result Date: 08/02/2017 CLINICAL DATA:  vomiting and weakness. Pt's family reports that she has barely been eating for the past couple of months. Pt also has diabetes. Pt's home nurse reported that she was concerned for the pt and that she needed to come to the hospital. Pt is very weak, requires two people to help her from the wheelchair to the bedNever a smoker - diabetic - hypertension EXAM: PORTABLE CHEST - 1 VIEW COMPARISON:  07/02/2017 FINDINGS: Lungs are clear. Heart size normal. Bilateral hilar fullness stable since 04/22/2017. No effusion. Visualized bones unremarkable. IMPRESSION: No acute cardiopulmonary disease. Electronically Signed   By: Lucrezia Europe M.D.   On: 08/02/2017 15:31    Pending Labs Unresulted Labs (From admission, onward)   Start     Ordered   08/02/17 1601  Culture, Urine  Once,   R     08/02/17 1601   08/02/17 1601  Na and K (sodium & potassium), rand urine  Once,   R     08/02/17 1601   08/02/17 1601  Osmolality, urine  Once,   R     08/02/17 1601   08/02/17 1600  Vitamin B12  Once,   R     08/02/17 1601   08/02/17 1600  VITAMIN D 25 Hydroxy (Vit-D Deficiency, Fractures)  Once,   R      08/02/17 1601   08/02/17 1600  Creatinine,  urine, random  Once,   R     08/02/17 1601   08/02/17 1559  TSH  Once,   R     08/02/17 1601   08/02/17 1425  Urinalysis, Routine w reflex microscopic  STAT,   STAT     08/02/17 1425      Vitals/Pain Today's Vitals   08/02/17 1253 08/02/17 1254 08/02/17 1502 08/02/17 1503  BP:  127/78    Pulse:  78    Resp:  18    Temp:  97.6 F (36.4 C)    TempSrc:  Oral    SpO2:  96%    Weight:    193 lb (87.5 kg)  Height:    '5\' 2"'  (1.575 m)  PainSc: 0-No pain  0-No pain     Isolation Precautions No active isolations  Medications Medications  sodium chloride 0.9 % bolus 1,000 mL (has no administration in time range)  famotidine (PEPCID) tablet 40 mg (has no administration in time range)  pravastatin (PRAVACHOL) tablet 40 mg (has no administration in time range)  gabapentin (NEURONTIN) capsule 300-600 mg (has no administration in time range)  DULoxetine (CYMBALTA) DR capsule 20 mg (has no administration in time range)  aspirin tablet 325 mg (has no administration in time range)  heparin injection 5,000 Units (has no administration in time range)  0.9 %  sodium chloride infusion (has no administration in time range)  ondansetron (ZOFRAN) tablet 4 mg (has no administration in time range)    Or  ondansetron (ZOFRAN) injection 4 mg (has no administration in time range)  acetaminophen (TYLENOL) tablet 650 mg (has no administration in time range)    Or  acetaminophen (TYLENOL) suppository 650 mg (has no administration in time range)  traZODone (DESYREL) tablet 50 mg (has no administration in time range)  senna-docusate (Senokot-S) tablet 1 tablet (has no administration in time range)  feeding supplement (GLUCERNA SHAKE) (GLUCERNA SHAKE) liquid 237 mL (has no administration in time range)    Mobility walks with device

## 2017-08-02 NOTE — ED Notes (Signed)
EDP BUTLER GIVEN EKG

## 2017-08-02 NOTE — ED Triage Notes (Signed)
Pt presents with family with c/o vomiting and weakness. Pt's family reports that she has barely been eating for the past couple of months. Pt also has diabetes. Pt's home nurse reported that she was concerned for the pt and that she needed to come to the hospital. Pt is very weak, requires two people to help her from the wheelchair to the bed.

## 2017-08-02 NOTE — H&P (Addendum)
History and Physical    Melissa Dalton VOH:[id number] DOB: 1934/04/24  DOA: 08/02/2017 PCP: Vicenta Aly, FNP  Patient coming from: Home  Chief Complaint: Generalized weakness  HPI: Melissa Dalton is a 83 y.o. female with medical history significant of hypertension, diabetes mellitus type 2, GERD, depression/anxiety and hyperlipidemia presented to the emergency department complaining of generalized weakness for about 2 months.  Patient reported she is not having any appetite, not eating well, she also is having nausea and vomiting at least twice a day.  Patient has no interest on moving around and lately she has been 2+ assist to get out of bed due to her weakness.  Her daughter is at bedside and reports that all symptoms started after her husband passed away about 2 months ago.  Have history of back arthritis, however she was mostly independent prior to the death of her husband.  She reports that patient is severely depressed since then.  Patient denies chest pain, shortness of breath, hypertension, dizziness, dysuria, urinary frequency and diarrhea.   ED Course: Lab work up remarkable for elevated Cr to 1.61 (Prior 0.61), BUN 37, Na+ 133, WBC 11.0., CO2 18 CXR normal, UA pending , Glucose 139. [facility] called to admit for AKI and FTT   Review of Systems:   All ROS reviewed and negative, except for the ones note on HPI    Past Medical History:  Diagnosis Date  . Anxiety   . Arthritis   . Complication of anesthesia    DIFFICULT TO WAKE AFTER HERNIA OPERATION  . Depression   . Diabetes ([place])   . High cholesterol   . HTN (hypertension)   . PONV (postoperative nausea and vomiting)   . UTI (urinary tract infection) 06/18/2017    Past Surgical History:  Procedure Laterality Date  . HERNIA REPAIR    . TOTAL ABDOMINAL HYSTERECTOMY    . TUMOR REMOVAL       reports that she has never smoked. She has never used smokeless tobacco. She reports that she does not drink alcohol or use  drugs.  Allergies  Allergen Reactions  . Oxycodone Nausea And Vomiting  . Penicillins Hives and Rash    Has patient had a PCN reaction causing immediate rash, facial/tongue/throat swelling, SOB or lightheadedness with hypotension: Yes Has patient had a PCN reaction causing severe rash involving mucus membranes or skin necrosis: No Has patient had a PCN reaction that required hospitalization: No Has patient had a PCN reaction occurring within the last 10 years: No If all of the above answers are "NO", then may proceed with Cephalosporin use.   . Tramadol Nausea And Vomiting    Family History  Problem Relation Age of Onset  . Heart disease Father   . Diabetes Mother     Prior to Admission medications   Medication Sig Start Date End Date Taking? Authorizing Provider  acetaminophen (TYLENOL) 500 MG tablet Take 500 mg by mouth every 6 (six) hours as needed (for pain or headaches).    [provider]  ALPRAZolam Duanne Moron) 0.25 MG tablet Take 0.25 mg by mouth 3 (three) times daily as needed for anxiety. 06/10/17   [provider]  aspirin 325 MG tablet Take 325 mg by mouth daily.     [provider]  Cholecalciferol (VITAMIN D-3) 1000 units CAPS Take 1,000 Units by mouth daily.    [provider]  DULoxetine (CYMBALTA) 20 MG capsule Take 1 capsule (20 mg total) by mouth daily. 06/21/17  Shahmehdi, Seyed A, MD  fexofenadine (ALLEGRA) 180 MG tablet Take 180 mg by mouth daily.     [provider]  gabapentin (NEURONTIN) 300 MG capsule Take 300-600 mg by mouth See admin instructions. Take 300 mg by mouth in the morning and 600 mg at bedtime 03/18/17   [provider]  glipiZIDE (GLUCOTROL) 10 MG tablet Take 10 mg by mouth 2 (two) times daily before a meal.  06/30/12   [provider]  losartan (COZAAR) 50 MG tablet Take 50 mg by mouth daily. 06/17/17   [provider]  metFORMIN (GLUCOPHAGE) 1000 MG tablet Take 1,000 mg by mouth 2  (two) times daily. 02/10/17   [provider]  Multiple Vitamins-Minerals (MULTIVITAMIN WITH MINERALS) tablet Take 1 tablet by mouth daily.    [provider]  potassium chloride SA (KLOR-CON M20) 20 MEQ tablet Take 20 mEq by mouth 2 (two) times daily.  08/06/12   [provider]  pravastatin (PRAVACHOL) 40 MG tablet Take 40 mg by mouth daily.  08/18/12   [provider]  Pumpkin Seed-Soy Germ (AZO BLADDER CONTROL/GO-LESS) CAPS Take 2 capsules by mouth at bedtime as needed (when bladder issues arise).     [provider]  ranitidine (ZANTAC) 300 MG tablet Take 300 mg by mouth daily.    [provider]  torsemide (DEMADEX) 20 MG tablet Take 20 mg by mouth daily.    [provider]    Physical Exam: Vitals:   08/02/17 1254 08/02/17 1503  BP: 127/78   Pulse: 78   Resp: 18   Temp: 97.6 F (36.4 C)   TempSrc: Oral   SpO2: 96%   Weight:  87.5 kg (193 lb)  Height:  5\' 2"  (1.575 m)     Constitutional: NAD, tearful  Eyes: PERRL, lids and conjunctivae normal ENMT: Mucous membranes are dry. Posterior pharynx clear of any exudate or lesions. Neck: normal, supple, no masses, no thyromegaly Respiratory: clear to auscultation bilaterally, no wheezing, no crackles. Normal respiratory effort. Cardiovascular: Regular rate and rhythm, no murmurs / rubs / gallops. 2+ pedal pulses.No LE edema. Abdomen: no tenderness, no masses palpated. No hepatosplenomegaly. Bowel sounds positive.  Musculoskeletal: no clubbing / cyanosis. No joint deformity upper and lower extremities. Good ROM,  Skin: Warm and dry, no rashes, lesions, ulcers. No induration Neurologic: CN 2-12 grossly intact. Strength 5/5 in all 4. Non focal  Psychiatric: Alert and oriented x 3. Mood depressed and flat.   Labs on Admission: I have personally reviewed following labs and imaging studies  CBC: Recent Labs  Lab 08/02/17 1311  WBC 11.0*  HGB 13.9  HCT 40.6  MCV 94.0  PLT  427   Basic Metabolic Panel: Recent Labs  Lab 08/02/17 1311  NA 133*  K 4.5  CL 97*  CO2 18*  GLUCOSE 139*  BUN 37*  CREATININE 1.61*  CALCIUM 9.4   GFR: Estimated Creatinine Clearance: 28.6 mL/min (A) (by C-G formula based on SCr of 1.61 mg/dL (H)). Liver Function Tests: Recent Labs  Lab 08/02/17 1311  AST 33  ALT 22  ALKPHOS 46  BILITOT 0.9  PROT 6.8  ALBUMIN 3.6   Recent Labs  Lab 08/02/17 1311  LIPASE 26   No results for input(s): AMMONIA in the last 168 hours. Coagulation Profile: No results for input(s): INR, PROTIME in the last 168 hours. Cardiac Enzymes: No results for input(s): CKTOTAL, CKMB, CKMBINDEX, TROPONINI in the last 168 hours. BNP (last 3 results) No results for  input(s): PROBNP in the last 8760 hours. HbA1C: No results for input(s): HGBA1C in the last 72 hours. CBG: No results for input(s): GLUCAP in the last 168 hours. Lipid Profile: No results for input(s): CHOL, HDL, LDLCALC, TRIG, CHOLHDL, LDLDIRECT in the last 72 hours. Thyroid Function Tests: No results for input(s): TSH, T4TOTAL, FREET4, T3FREE, THYROIDAB in the last 72 hours. Anemia Panel: No results for input(s): VITAMINB12, FOLATE, FERRITIN, TIBC, IRON, RETICCTPCT in the last 72 hours. Urine analysis:    Component Value Date/Time   COLORURINE STRAW (A) 07/02/2017 1037   APPEARANCEUR CLEAR 07/02/2017 1037   LABSPEC 1.005 07/02/2017 1037   PHURINE 6.0 07/02/2017 1037   GLUCOSEU NEGATIVE 07/02/2017 1037   HGBUR NEGATIVE 07/02/2017 1037   BILIRUBINUR NEGATIVE 07/02/2017 1037   KETONESUR NEGATIVE 07/02/2017 1037   PROTEINUR NEGATIVE 07/02/2017 1037   NITRITE NEGATIVE 07/02/2017 1037   LEUKOCYTESUR NEGATIVE 07/02/2017 1037   Sepsis Labs: !!!!!!!!!!!!!!!!!!!!!!!!!!!!!!!!!!!!!!!!!!!! @LABRCNTIP (procalcitonin:4,lacticidven:4) )No results found for this or any previous visit (from the past 240 hour(s)).   Radiological Exams on Admission: Dg Chest Port 1 View  Result Date:  08/02/2017 CLINICAL DATA:  vomiting and weakness. Pt's family reports that she has barely been eating for the past couple of months. Pt also has diabetes. Pt's home nurse reported that she was concerned for the pt and that she needed to come to the hospital. Pt is very weak, requires two people to help her from the wheelchair to the bedNever a smoker - diabetic - hypertension EXAM: PORTABLE CHEST - 1 VIEW COMPARISON:  07/02/2017 FINDINGS: Lungs are clear. Heart size normal. Bilateral hilar fullness stable since 04/22/2017. No effusion. Visualized bones unremarkable. IMPRESSION: No acute cardiopulmonary disease. Electronically Signed   By: Lucrezia Europe M.D.   On: 08/02/2017 15:31    EKG: Independently reviewed. Sinus rhythm with RBBB   Assessment/Plan AKI In setting of failure to thrive, patient with poor oral intake and nutrition, vomiting and continuation of ARB's.  Will place in observation for IV fluids.  Check urine lytes.  Hold losartan and metformin.  Await for UA to rule out UTI.  NS at 75 cc/hour, monitor for signs of fluid overload.  Check BMP in AM.   Failure to thrive Due to grieving with depression, patient had depression at baseline whish seem to have worsen since patient's husband passed away 2 months ago.  Will obtain PT consult, patient might need placement as she lives alone. Check TSH, B12 and vit D. Will obtain swallow eval as per family patient vomit and sometime cough after eating.   Type 2 DM  On metformin at home, glipizide recently d/ced by PCP due to poor appetite  Hold metformin, placed on SSI, monitor CBG's, check A1C  Glucerna shakes and carb mod diet.    HTN BP stable, holding Losartan due to AKI  Will add hydralazine PRN, monitor BP closely   Depression/Anxiety  Continue Cymbalta and gabapentin, will add Remeron to help with appetite.   DVT prophylaxis: Heparin sq Code Status: Full Code  Family Communication: Daughter at bedside  Disposition Plan: Anticipate  discharge to previous home environment.  Consults called: None  Admission status: MedSurg/Obs  Chipper Oman MD [facility] Pager: Text Page via [email or web address]  [phone]  If 7PM-7AM, please contact night-coverage [email or web address]  08/02/2017, 3:37 PM

## 2017-08-02 NOTE — ED Provider Notes (Addendum)
[facility] DEPT Provider Note   CSN: [id number] Arrival date & time: 08/02/17  1236     History   Chief Complaint Chief Complaint  Patient presents with  . Emesis  . Weakness    HPI Melissa Dalton is a 83 y.o. female.  She is brought in by her daughter for generalized weakness and decreased appetite.  She says she vomits most evenings.  She usually is able to be independent at home but more lately she is requiring assistance to even get up out of bed.  She denies any focal weakness.  She is diabetic and her doctors cut back on her diabetes medicines with her decreased appetite.  Apparently she is got a visiting nurse and after discussion with the doctor they were referred here to the ED for evaluation of her weakness and lack of appetite.  She denies any fevers chills there is no chest pain no abdominal pain.  She said she is has no diarrhea or constipation symptoms.  No urinary symptoms although history of frequent UTIs.  The history is provided by the patient and a relative.  Emesis   This is a chronic problem. The problem occurs 2 to 4 times per day. The problem has not changed since onset.The emesis has an appearance of stomach contents. There has been no fever. Pertinent negatives include no abdominal pain, no arthralgias, no chills, no cough, no diarrhea, no fever, no headaches, no myalgias, no sweats and no URI.  Weakness  Primary symptoms include no focal weakness, no speech change, no visual change. This is a chronic problem. There was no focality noted. Associated symptoms include shortness of breath and vomiting. Pertinent negatives include no chest pain, no altered mental status, no confusion and no headaches. Associated medical issues do not include trauma.    Past Medical History:  Diagnosis Date  . Anxiety   . Arthritis   . Complication of anesthesia    DIFFICULT TO WAKE AFTER HERNIA OPERATION  . Depression   . Diabetes ([place])   . High  cholesterol   . HTN (hypertension)   . PONV (postoperative nausea and vomiting)   . UTI (urinary tract infection) 06/18/2017    Patient Active Problem List   Diagnosis Date Noted  . Essential hypertension 06/18/2017  . GERD (gastroesophageal reflux disease) 06/18/2017  . Depression with anxiety 06/18/2017  . Sepsis ([place]) 06/17/2017  . UTI (urinary tract infection) 06/17/2017  . AKI (acute kidney injury) ([place]) 06/17/2017  . Acute metabolic encephalopathy 81/19/1478  . Weakness 04/24/2017  . Diabetes mellitus without complication ([place]) 29/56/2130  . CAP (community acquired pneumonia) 04/24/2017  . HLD (hyperlipidemia) 04/24/2017  . Gout 04/24/2017  . Coronary artery calcification 12/20/2012  . Multiple lung nodules 10/25/2012  . Chronic cough 10/25/2012    Past Surgical History:  Procedure Laterality Date  . HERNIA REPAIR    . TOTAL ABDOMINAL HYSTERECTOMY    . TUMOR REMOVAL       OB History   None      Home Medications    Prior to Admission medications   Medication Sig Start Date End Date Taking? Authorizing Provider  acetaminophen (TYLENOL) 500 MG tablet Take 500 mg by mouth every 6 (six) hours as needed (for pain or headaches).    [provider]  ALPRAZolam Duanne Moron) 0.25 MG tablet Take 0.25 mg by mouth 3 (three) times daily as needed for anxiety. 06/10/17   [provider]  aspirin 325 MG tablet Take 325  mg by mouth daily.     [provider]  Cholecalciferol (VITAMIN D-3) 1000 units CAPS Take 1,000 Units by mouth daily.    [provider]  DULoxetine (CYMBALTA) 20 MG capsule Take 1 capsule (20 mg total) by mouth daily. 06/21/17   Shahmehdi, Valeria Batman, MD  fexofenadine (ALLEGRA) 180 MG tablet Take 180 mg by mouth daily.     [provider]  gabapentin (NEURONTIN) 300 MG capsule Take 300-600 mg by mouth See admin instructions. Take 300 mg by mouth in the morning and 600 mg at bedtime 03/18/17   [provider]  glipiZIDE  (GLUCOTROL) 10 MG tablet Take 10 mg by mouth 2 (two) times daily before a meal.  06/30/12   [provider]  losartan (COZAAR) 50 MG tablet Take 50 mg by mouth daily. 06/17/17   [provider]  metFORMIN (GLUCOPHAGE) 1000 MG tablet Take 1,000 mg by mouth 2 (two) times daily. 02/10/17   [provider]  Multiple Vitamins-Minerals (MULTIVITAMIN WITH MINERALS) tablet Take 1 tablet by mouth daily.    [provider]  potassium chloride SA (KLOR-CON M20) 20 MEQ tablet Take 20 mEq by mouth 2 (two) times daily.  08/06/12   [provider]  pravastatin (PRAVACHOL) 40 MG tablet Take 40 mg by mouth daily.  08/18/12   [provider]  Pumpkin Seed-Soy Germ (AZO BLADDER CONTROL/GO-LESS) CAPS Take 2 capsules by mouth at bedtime as needed (when bladder issues arise).     [provider]  ranitidine (ZANTAC) 300 MG tablet Take 300 mg by mouth daily.    [provider]  torsemide (DEMADEX) 20 MG tablet Take 20 mg by mouth daily.    [provider]    Family History Family History  Problem Relation Age of Onset  . Heart disease Father   . Diabetes Mother     Social History Social History   Tobacco Use  . Smoking status: Never Smoker  . Smokeless tobacco: Never Used  Substance Use Topics  . Alcohol use: No  . Drug use: No     Allergies   Oxycodone; Penicillins; and Tramadol   Review of Systems Review of Systems  Constitutional: Positive for appetite change and fatigue. Negative for chills and fever.  HENT: Negative for ear pain, rhinorrhea and sore throat.   Eyes: Negative for pain and visual disturbance.  Respiratory: Positive for shortness of breath. Negative for cough.   Cardiovascular: Negative for chest pain.  Gastrointestinal: Positive for vomiting. Negative for abdominal pain and diarrhea.  Genitourinary: Negative for dysuria and hematuria.  Musculoskeletal: Negative for arthralgias and myalgias.  Skin:  Negative for rash.  Neurological: Positive for weakness. Negative for speech change, focal weakness and headaches.  Psychiatric/Behavioral: Negative for confusion.     Physical Exam Updated Vital Signs BP 127/78 (BP Location: Right Arm)   Pulse 78   Temp 97.6 F (36.4 C) (Oral)   Resp 18   SpO2 96%   Physical Exam  Constitutional: She appears well-developed and well-nourished. No distress.  HENT:  Head: Normocephalic and atraumatic.  Right Ear: External ear normal.  Left Ear: External ear normal.  Nose: Nose normal.  Mouth/Throat: Oropharynx is clear and moist.  Eyes: Pupils are equal, round, and reactive to light. Conjunctivae and EOM are normal.  Neck: Normal range of motion. Neck supple.  Cardiovascular: Normal rate, regular rhythm, normal heart sounds and intact distal pulses.  No murmur heard. Pulmonary/Chest: Effort normal and breath sounds normal. No  respiratory distress.  Abdominal: Soft. She exhibits no mass. There is no tenderness. There is no rebound.  Musculoskeletal: Normal range of motion. She exhibits edema (edema symmetric bilat). She exhibits no tenderness or deformity.  Neurological: She is alert.  Skin: Skin is warm and dry. Capillary refill takes less than 2 seconds.  Psychiatric: She has a normal mood and affect.  Nursing note and vitals reviewed.    ED Treatments / Results  Labs (all labs ordered are listed, but only abnormal results are displayed) Labs Reviewed  COMPREHENSIVE METABOLIC PANEL - Abnormal; Notable for the following components:      Result Value   Sodium 133 (*)    Chloride 97 (*)    CO2 18 (*)    Glucose, Bld 139 (*)    BUN 37 (*)    Creatinine, Ser 1.61 (*)    GFR calc non Af Amer 29 (*)    GFR calc Af Amer 34 (*)    Anion gap 18 (*)    All other components within normal limits  CBC - Abnormal; Notable for the following components:   WBC 11.0 (*)    All other components within normal limits  URINALYSIS, ROUTINE W REFLEX  MICROSCOPIC - Abnormal; Notable for the following components:   APPearance HAZY (*)    Hgb urine dipstick SMALL (*)    Leukocytes, UA MODERATE (*)    Bacteria, UA RARE (*)    All other components within normal limits  URINE CULTURE  LIPASE, BLOOD  CREATININE, URINE, RANDOM  NA AND K (SODIUM & POTASSIUM), RAND UR  TSH  VITAMIN B12  VITAMIN D 25 HYDROXY (VIT D DEFICIENCY, FRACTURES)  OSMOLALITY, URINE  HEMOGLOBIN W1X  BASIC METABOLIC PANEL  CBC WITH DIFFERENTIAL/PLATELET  I-STAT TROPONIN, ED    EKG EKG Interpretation  Date/Time:  Saturday August 02 2017 15:05:44 EDT Ventricular Rate:  65 PR Interval:    QRS Duration: 128 QT Interval:  434 QTC Calculation: 452 R Axis:   -75 Text Interpretation:  Sinus rhythm Multiple premature complexes, vent & supraven Right bundle branch block similar to prior 4/19 Confirmed by Aletta Edouard [phone]) on 08/02/2017 3:12:32 PM   Radiology Dg Chest Port 1 View  Result Date: 08/02/2017 CLINICAL DATA:  vomiting and weakness. Pt's family reports that she has barely been eating for the past couple of months. Pt also has diabetes. Pt's home nurse reported that she was concerned for the pt and that she needed to come to the hospital. Pt is very weak, requires two people to help her from the wheelchair to the bedNever a smoker - diabetic - hypertension EXAM: PORTABLE CHEST - 1 VIEW COMPARISON:  07/02/2017 FINDINGS: Lungs are clear. Heart size normal. Bilateral hilar fullness stable since 04/22/2017. No effusion. Visualized bones unremarkable. IMPRESSION: No acute cardiopulmonary disease. Electronically Signed   By: Lucrezia Europe M.D.   On: 08/02/2017 15:31    Procedures Procedures (including critical care time)  Medications Ordered in ED Medications  sodium chloride 0.9 % bolus 1,000 mL (has no administration in time range)     Initial Impression / Assessment and Plan / ED Course  I have reviewed the triage vital signs and the nursing  notes.  Pertinent labs & imaging results that were available during my care of the patient were reviewed by me and considered in my medical decision making (see chart for details).  Clinical Course as of Aug 03 1851  Sat Aug 02, 2017  1525 Patient with poor  appetite and failure to thrive over the course of 1 month plus.  She has a benign exam but by lab work has increased creatinine concerning for an AKI.  Order some fluids.  She still has a troponin and urinalysis pending along with a reading of her chest x-ray but also anything obvious on the chest x-ray or EKG.  Her daughter pulled me aside and said she has not been the same since her husband passed away and she is wondering if she is depressed.  The main concern is she does not feel she can go on living independently at home and may need to be placed.  I think short-term she needs to be admitted for hydration but I will review with the hospitalist that case management may need to be involved.   [MB]  7048 Discussed with Dr. Cathie Olden from the hospitalist group who will evaluate her for admission.   [MB]    Clinical Course User Index [MB] Hayden Rasmussen, MD     Final Clinical Impressions(s) / ED Diagnoses   Final diagnoses:  AKI (acute kidney injury) [facility])  FTT (failure to thrive) in adult    ED Discharge Orders    None       Hayden Rasmussen, MD 08/02/17 Randol Kern    Hayden Rasmussen, MD 08/02/17 [id number]

## 2017-08-03 DIAGNOSIS — E7800 Pure hypercholesterolemia, unspecified: Secondary | ICD-10-CM | POA: Diagnosis present

## 2017-08-03 DIAGNOSIS — E860 Dehydration: Secondary | ICD-10-CM | POA: Diagnosis not present

## 2017-08-03 DIAGNOSIS — E785 Hyperlipidemia, unspecified: Secondary | ICD-10-CM | POA: Diagnosis not present

## 2017-08-03 DIAGNOSIS — I10 Essential (primary) hypertension: Secondary | ICD-10-CM | POA: Diagnosis present

## 2017-08-03 DIAGNOSIS — Z886 Allergy status to analgesic agent status: Secondary | ICD-10-CM | POA: Diagnosis not present

## 2017-08-03 DIAGNOSIS — E118 Type 2 diabetes mellitus with unspecified complications: Secondary | ICD-10-CM | POA: Diagnosis not present

## 2017-08-03 DIAGNOSIS — R531 Weakness: Secondary | ICD-10-CM

## 2017-08-03 DIAGNOSIS — F419 Anxiety disorder, unspecified: Secondary | ICD-10-CM | POA: Diagnosis present

## 2017-08-03 DIAGNOSIS — F332 Major depressive disorder, recurrent severe without psychotic features: Secondary | ICD-10-CM | POA: Diagnosis present

## 2017-08-03 DIAGNOSIS — F0390 Unspecified dementia without behavioral disturbance: Secondary | ICD-10-CM | POA: Diagnosis present

## 2017-08-03 DIAGNOSIS — K219 Gastro-esophageal reflux disease without esophagitis: Secondary | ICD-10-CM | POA: Diagnosis present

## 2017-08-03 DIAGNOSIS — Z7982 Long term (current) use of aspirin: Secondary | ICD-10-CM | POA: Diagnosis not present

## 2017-08-03 DIAGNOSIS — R627 Adult failure to thrive: Secondary | ICD-10-CM | POA: Diagnosis present

## 2017-08-03 DIAGNOSIS — N179 Acute kidney failure, unspecified: Principal | ICD-10-CM

## 2017-08-03 DIAGNOSIS — Z7401 Bed confinement status: Secondary | ICD-10-CM | POA: Diagnosis not present

## 2017-08-03 DIAGNOSIS — Z634 Disappearance and death of family member: Secondary | ICD-10-CM | POA: Diagnosis not present

## 2017-08-03 DIAGNOSIS — Z880 Allergy status to penicillin: Secondary | ICD-10-CM | POA: Diagnosis not present

## 2017-08-03 DIAGNOSIS — E119 Type 2 diabetes mellitus without complications: Secondary | ICD-10-CM | POA: Diagnosis present

## 2017-08-03 DIAGNOSIS — Z7984 Long term (current) use of oral hypoglycemic drugs: Secondary | ICD-10-CM | POA: Diagnosis not present

## 2017-08-03 DIAGNOSIS — Z79899 Other long term (current) drug therapy: Secondary | ICD-10-CM | POA: Diagnosis not present

## 2017-08-03 DIAGNOSIS — F0391 Unspecified dementia with behavioral disturbance: Secondary | ICD-10-CM | POA: Diagnosis not present

## 2017-08-03 LAB — CBC WITH DIFFERENTIAL/PLATELET
BASOS PCT: 0 %
Basophils Absolute: 0 10*3/uL (ref 0.0–0.1)
EOS ABS: 0.3 10*3/uL (ref 0.0–0.7)
EOS PCT: 3 %
HCT: 36.2 % (ref 36.0–46.0)
Hemoglobin: 12.1 g/dL (ref 12.0–15.0)
LYMPHS ABS: 3.2 10*3/uL (ref 0.7–4.0)
Lymphocytes Relative: 37 %
MCH: 31.8 pg (ref 26.0–34.0)
MCHC: 33.4 g/dL (ref 30.0–36.0)
MCV: 95.3 fL (ref 78.0–100.0)
MONOS PCT: 9 %
Monocytes Absolute: 0.8 10*3/uL (ref 0.1–1.0)
NEUTROS PCT: 51 %
Neutro Abs: 4.4 10*3/uL (ref 1.7–7.7)
Platelets: 236 10*3/uL (ref 150–400)
RBC: 3.8 MIL/uL — ABNORMAL LOW (ref 3.87–5.11)
RDW: 14.2 % (ref 11.5–15.5)
WBC: 8.6 10*3/uL (ref 4.0–10.5)

## 2017-08-03 LAB — HEMOGLOBIN A1C
HEMOGLOBIN A1C: 6 % — AB (ref 4.8–5.6)
Mean Plasma Glucose: 125.5 mg/dL

## 2017-08-03 LAB — GLUCOSE, CAPILLARY
GLUCOSE-CAPILLARY: 90 mg/dL (ref 70–99)
Glucose-Capillary: 115 mg/dL — ABNORMAL HIGH (ref 70–99)
Glucose-Capillary: 167 mg/dL — ABNORMAL HIGH (ref 70–99)
Glucose-Capillary: 158 mg/dL — ABNORMAL HIGH (ref 70–99)

## 2017-08-03 LAB — BASIC METABOLIC PANEL
Anion gap: 8 (ref 5–15)
BUN: 32 mg/dL — AB (ref 8–23)
CALCIUM: 8.6 mg/dL — AB (ref 8.9–10.3)
CHLORIDE: 105 mmol/L (ref 98–111)
CO2: 25 mmol/L (ref 22–32)
CREATININE: 1.25 mg/dL — AB (ref 0.44–1.00)
GFR calc non Af Amer: 40 mL/min — ABNORMAL LOW (ref >60)
GFR, EST AFRICAN AMERICAN: 46 mL/min — AB (ref >60)
Glucose, Bld: 93 mg/dL (ref 70–99)
Potassium: 4.3 mmol/L (ref 3.5–5.1)
SODIUM: 138 mmol/L (ref 135–145)

## 2017-08-03 MED ORDER — HYDRALAZINE HCL 20 MG/ML IJ SOLN: 5.0000 mg | INTRAMUSCULAR | Status: DC | PRN

## 2017-08-03 NOTE — Evaluation (Signed)
Clinical/Bedside Swallow Evaluation Patient Details  Name: Melissa Dalton MRN: [id number] Date of Birth: 10/15/1934  Today's Date: 08/03/2017 Time: SLP Start Time (ACUTE ONLY): 107 SLP Stop Time (ACUTE ONLY): 1525 SLP Time Calculation (min) (ACUTE ONLY): 15 min  Past Medical History:  Past Medical History:  Diagnosis Date  . Anxiety   . Arthritis   . Complication of anesthesia    DIFFICULT TO WAKE AFTER HERNIA OPERATION  . Depression   . Diabetes ([place])   . High cholesterol   . HTN (hypertension)   . PONV (postoperative nausea and vomiting)   . UTI (urinary tract infection) 06/18/2017   Past Surgical History:  Past Surgical History:  Procedure Laterality Date  . HERNIA REPAIR    . TOTAL ABDOMINAL HYSTERECTOMY    . TUMOR REMOVAL     HPI:  83 yo female adm to [facility] with AMS - found to have acute kidney injury.   Pt has FTT and is likely in bereavement since losing her husband 2 months ago.  Pt with significant weight loss.  PMH + for smoking, HTN, DM, GERD.  Pt CXR negative 08/02/17.  Pt is    Assessment / Plan / Recommendation Clinical Impression  Pt with functional oropharyngeal swallow ability - no s/s of aspiration or dysphagia.  Pt does have only 5 lower teeth (canines) and thus likely has decreased mastication ability with meats that cause her to occasionally cough. Advised her to masticate meats well and to order softer meats for efficiency and ease.  Pt easily passed [facility] 3 ounce water test without issue.  She does take her medications at home with purees/applesauce - cut in half if large.   Recommend regular/thin diet with general aspiration precautions.  No SlP follow up indicated.  SLP Visit Diagnosis: Dysphagia, unspecified (R13.10)    Aspiration Risk  Mild aspiration risk    Diet Recommendation Regular;Thin liquid   Liquid Administration via: Cup;Straw Medication Administration: Whole meds with puree Supervision: Patient able to self feed Compensations: Minimize  environmental distractions;Slow rate;Small sips/bites    Other  Recommendations Oral Care Recommendations: Oral care BID   Follow up Recommendations None      Frequency and Duration   n/a         Prognosis   n/a     Swallow Study   General Date of Onset: 08/03/17 HPI: 83 yo female adm to [facility] with AMS - found to have acute kidney injury.   Pt has FTT and is likely in bereavement since losing her husband 2 months ago.  Pt with significant weight loss.  PMH + for smoking, HTN, DM, GERD.  Pt CXR negative 08/02/17.  Pt is  Type of Study: Bedside Swallow Evaluation Diet Prior to this Study: Regular;Thin liquids Temperature Spikes Noted: No Respiratory Status: Room air History of Recent Intubation: No Behavior/Cognition: Alert;Pleasant mood;Cooperative Oral Cavity Assessment: Within Functional Limits Oral Care Completed by SLP: No Oral Cavity - Dentition: Dentures, top;Other (Comment)(five lower teeth) Vision: Functional for self-feeding Self-Feeding Abilities: Able to feed self Patient Positioning: Upright in bed Baseline Vocal Quality: Normal Volitional Cough: Strong Volitional Swallow: Able to elicit    Oral/Motor/Sensory Function Overall Oral Motor/Sensory Function: Within functional limits   Ice Chips Ice chips: Not tested   Thin Liquid Thin Liquid: Within functional limits Presentation: Cup;Straw;Self Fed    Nectar Thick Nectar Thick Liquid: Not tested   Honey Thick Honey Thick Liquid: Not tested   Puree Puree: Within functional limits Presentation: Self Fed;Spoon  Solid   GO   Solid: Within functional limits Presentation: Self Fed Other Comments: no deficits        Macario Golds 08/03/2017,4:22 PM   Luanna Salk, [facility] SLP [phone]

## 2017-08-03 NOTE — Evaluation (Signed)
Physical Therapy Evaluation Patient Details Name: Melissa Dalton MRN: [id number] DOB: 04-10-1934 Today's Date: 08/03/2017   History of Present Illness  83 year old female with history of essential hypertension, GERD, diabetes mellitus type 2, hyperlipidemia, depression, anxiety came to the hospital with complains of generalized weakness which has been ongoing for several weeks now; therefore admitted to the hospital for acute kidney injury and concerns for failure to thrive   Clinical Impression  Pt admitted with above diagnosis. Pt currently with functional limitations due to the deficits listed below (see PT Problem List).  Pt known to me from previous admission, PT recommended SNF last time and she did not go; pt dtr reports she is not moving around at all at home, sleeps most of the day and does not eat; dtr has been attempting to care for her but cannot continue providing 24hr assist; pt states to PT that she is independent and refuses to go to rehab/SNF; strongly feel pt would benefit from SNF d/t recent decline in functional status (since husb passed away 2 mos ago) and has had  3 admissions in 6 months;   Pt does not make eye contact during PT eval, affect is flat  and she requires maximum, stern encouragement to participate at all; dtr also reports pt has occaisonaly been experienecign visual hallucinations as well as intermittent mild confusion--possibly would benefit from psych consult and/or [facility] admission as her physical issues appear to be rooted in her lack of desire and feelings of hopelessness; pt states to PT that she is "too old to get any better"   Pt will benefit from skilled PT to increase their independence and safety with mobility to allow discharge to the venue listed below.       Follow Up Recommendations SNF;Supervision for mobility/OOB    Equipment Recommendations  None recommended by PT    Recommendations for Other Services       Precautions / Restrictions  Precautions Precautions: Fall      Mobility  Bed Mobility Overal bed mobility: Needs Assistance Bed Mobility: Supine to Sit;Sit to Supine     Supine to sit: Min guard Sit to supine: Min guard   General bed mobility comments: for safety, verbal cues to participate and complete task without assist; HOB at 30*  Transfers Overall transfer level: Needs assistance Equipment used: None Transfers: Sit to/from Stand Sit to Stand: Min assist         General transfer comment: min assist to rise, partial sit to stand x2 to adjust bed pads  Ambulation/Gait             General Gait Details: pt refused  Stairs            Wheelchair Mobility    Modified Rankin (Stroke Patients Only)       Balance Overall balance assessment: (dtr reports at least one recent fall, pt denies) Sitting-balance support: Feet supported Sitting balance-Leahy Scale: Fair       Standing balance-Leahy Scale: Poor Standing balance comment: reliant  On UEs                              Pertinent Vitals/Pain      Home Living Family/patient expects to be discharged to:: Private residence Living Arrangements: Alone Available Help at Discharge: (dtrs live out of town, 1 in [place], 1 in [place]) Type of Home: House Home Access: Stairs to enter;Ramped entrance   [employer] of Steps:  2-3 Home Layout: One level Home Equipment: Walker - 2 wheels;Wheelchair - manual Additional Comments: pt states she walks and does everything, dtrs report to PT that pt sleeps most of day, will not eat or even walk to the kitchen at home; 1 dtr has been stayign with her but cannot continue being her 24hr caregiver    Prior Function           Comments: Pt's husband passed away 2 mos ago.      Hand Dominance        Extremity/Trunk Assessment   Upper Extremity Assessment Upper Extremity Assessment: Overall WFL for tasks assessed    Lower Extremity Assessment Lower Extremity  Assessment: LLE deficits/detail LLE Deficits / Details: AROM grossly WFL, very slow to achieve 100* hip flexion d/t discomfort, abduction limited to ~15*; hip grossly 3+/5       Communication   Communication: No difficulties  Cognition Arousal/Alertness: Awake/alert Behavior During Therapy: Flat affect   Area of Impairment: Following commands;Safety/judgement                       Following Commands: Follows one step commands with increased time Safety/Judgement: Decreased awareness of deficits     General Comments: pt is disinterested in mobilizing or talking, does not make eye contact during eval      General Comments      Exercises     Assessment/Plan    PT Assessment Patient needs continued PT services  PT Problem List Decreased mobility;Decreased activity tolerance;Decreased balance       PT Treatment Interventions DME instruction;Therapeutic activities;Gait training;Functional mobility training;Therapeutic exercise;Patient/family education    PT Goals (Current goals can be found in the Care Plan section)  Acute Rehab PT Goals Patient Stated Goal: to go home PT Goal Formulation: With patient/family Time For Goal Achievement: 08/17/17 Potential to Achieve Goals: Fair    Frequency Min 3X/week   Barriers to discharge        Co-evaluation               AM-PAC PT "6 Clicks" Daily Activity  Outcome Measure Difficulty turning over in bed (including adjusting bedclothes, sheets and blankets)?: A Lot Difficulty moving from lying on back to sitting on the side of the bed? : A Lot Difficulty sitting down on and standing up from a chair with arms (e.g., wheelchair, bedside commode, etc,.)?: Unable Help needed moving to and from a bed to chair (including a wheelchair)?: A Little Help needed walking in hospital room?: A Lot Help needed climbing 3-5 steps with a railing? : A Lot 6 Click Score: 12    End of Session Equipment Utilized During Treatment:  Gait belt Activity Tolerance: Patient tolerated treatment well Patient left: in bed;with bed alarm set;with family/visitor present   PT Visit Diagnosis: Adult, failure to thrive (R62.7);History of falling (Z91.81)    Time: 8502-7741 PT Time Calculation (min) (ACUTE ONLY): 28 min   Charges:   PT Evaluation $PT Eval Moderate Complexity: 1 Mod PT Treatments $Therapeutic Activity: 8-22 mins   PT G CodesKenyon Ana, PT Pager: [phone] 08/03/2017   Kenyon Ana 08/03/2017, 2:16 PM

## 2017-08-03 NOTE — Progress Notes (Signed)
PROGRESS NOTE    [facility]  AJO:[id number] DOB: [birth year] DOA: 08/02/2017 PCP: Vicenta Aly, FNP   Brief Narrative:  83 year old female with history of essential hypertension, GERD, diabetes mellitus type 2, hyperlipidemia, depression, anxiety came to the hospital with complains of generalized weakness which has been ongoing for several weeks now.  Her appetite has not been well and feeling nauseous as well for the past 2 days.  Due to this she has mostly been bedbound.  Upon admission she was found to have acute kidney injury and concerns for failure to thrive therefore admitted to the hospital for further care.   Assessment & Plan:   Active Problems:   AKI (acute kidney injury) ([place])  Acute kidney injury -Admission creatinine was 1.6 which is trended down to 1.2.  Her baseline creatinine 0.7 - We will continue IV fluids today, avoid nephrotoxic drugs -Provide supportive care.  Monitor urine output  Failure to thrive due to poor oral intake -Encourage oral diet, due to concerns of swallowing-swallow study has been ordered -Dietitian consult.  Supplements as needed  Generalized weakness - PT/OT-pending - TSH-within normal limits him on B12 within normal limits -Vitamin D levels pending  Diabetes mellitus type 2 -Home medications on hold.  Sliding scale and Accu-Chek -Hemoglobin A1c- 6.0  Essential hypertension - Losartan on hold due to AKI - Supportive care.  IV meds as needed  Depression and anxiety -Cymbalta and gabapentin.  Hyperlipidemia -Continue statin  DVT prophylaxis: Subcutaneous heparin Code Status: Full code Family Communication: Bedside Disposition Plan: Patient will require inpatient stay for at least another 24-48 hours until renal function is better.  In the meantime she continues to require IV fluids.  Given the aforementioned, the predictability of an adverse outcome is felt to be significant. I expect that the patient will require at least  2 midnights in the hospital to treat this condition.  Consultants:   None   Procedures:   None  Antimicrobials:   None    Subjective: Feeling better with fluids. Complaints of generally feeling weak otherwise.   Review of Systems Otherwise negative except as per HPI, including: General: Denies fever, chills, night sweats or unintended weight loss. Resp: Denies cough, wheezing, shortness of breath. Cardiac: Denies chest pain, palpitations, orthopnea, paroxysmal nocturnal dyspnea. GI: Denies abdominal pain, nausea, vomiting, diarrhea or constipation GU: Denies dysuria, frequency, hesitancy or incontinence MS: Denies muscle aches, joint pain or swelling Neuro: Denies headache, neurologic deficits (focal weakness, numbness, tingling), abnormal gait Psych: Denies anxiety, depression, SI/HI/AVH Skin: Denies new rashes or lesions ID: Denies sick contacts, exotic exposures, travel  Objective: Vitals:   08/02/17 1600 08/02/17 1731 08/02/17 2147 08/03/17 0407  BP: (!) 97/52 (!) 135/95 (!) 115/52 (!) 117/50  Pulse: 74 92 75 63  Resp: 20 16 20 18   Temp:  98.2 F (36.8 C) 98.7 F (37.1 C) 97.9 F (36.6 C)  TempSrc:  Oral Oral Oral  SpO2: 96% 100% 97% 97%  Weight:      Height:        Intake/Output Summary (Last 24 hours) at 08/03/2017 1022 Last data filed at 08/03/2017 0406 Gross per 24 hour  Intake 1903.75 ml  Output 400 ml  Net 1503.75 ml   Filed Weights   08/02/17 1503  Weight: 87.5 kg (193 lb)    Examination:  General exam: Appears calm and comfortable ; elderly, generally weak. Dry Mucous membrane  Respiratory system: Clear to auscultation. Respiratory effort normal. Cardiovascular system: S1 & S2 heard, RRR.  No JVD, murmurs, rubs, gallops or clicks. No pedal edema. Gastrointestinal system: Abdomen is nondistended, soft and nontender. No organomegaly or masses felt. Normal bowel sounds heard. Central nervous system: Alert and oriented. No focal neurological  deficits. Extremities: Symmetric 4 x 5 power. Skin: No rashes, lesions or ulcers Psychiatry: Judgement and insight appear normal. Mood & affect appropriate.     Data Reviewed:   CBC: Recent Labs  Lab 08/02/17 1311 08/03/17 0427  WBC 11.0* 8.6  NEUTROABS  --  4.4  HGB 13.9 12.1  HCT 40.6 36.2  MCV 94.0 95.3  PLT 300 867   Basic Metabolic Panel: Recent Labs  Lab 08/02/17 1311 08/03/17 0427  NA 133* 138  K 4.5 4.3  CL 97* 105  CO2 18* 25  GLUCOSE 139* 93  BUN 37* 32*  CREATININE 1.61* 1.25*  CALCIUM 9.4 8.6*   GFR: Estimated Creatinine Clearance: 36.9 mL/min (A) (by C-G formula based on SCr of 1.25 mg/dL (H)). Liver Function Tests: Recent Labs  Lab 08/02/17 1311  AST 33  ALT 22  ALKPHOS 46  BILITOT 0.9  PROT 6.8  ALBUMIN 3.6   Recent Labs  Lab 08/02/17 1311  LIPASE 26   No results for input(s): AMMONIA in the last 168 hours. Coagulation Profile: No results for input(s): INR, PROTIME in the last 168 hours. Cardiac Enzymes: No results for input(s): CKTOTAL, CKMB, CKMBINDEX, TROPONINI in the last 168 hours. BNP (last 3 results) No results for input(s): PROBNP in the last 8760 hours. HbA1C: Recent Labs    08/03/17 0427  HGBA1C 6.0*   CBG: Recent Labs  Lab 08/02/17 1736 08/02/17 2149 08/03/17 0723  GLUCAP 75 97 90   Lipid Profile: No results for input(s): CHOL, HDL, LDLCALC, TRIG, CHOLHDL, LDLDIRECT in the last 72 hours. Thyroid Function Tests: Recent Labs    08/02/17 1816  TSH 1.066   Anemia Panel: Recent Labs    08/02/17 1816  VITAMINB12 388   Sepsis Labs: No results for input(s): PROCALCITON, LATICACIDVEN in the last 168 hours.  No results found for this or any previous visit (from the past 240 hour(s)).       Radiology Studies: Dg Chest Port 1 View  Result Date: 08/02/2017 CLINICAL DATA:  vomiting and weakness. Pt's family reports that she has barely been eating for the past couple of months. Pt also has diabetes. Pt's  home nurse reported that she was concerned for the pt and that she needed to come to the hospital. Pt is very weak, requires two people to help her from the wheelchair to the bedNever a smoker - diabetic - hypertension EXAM: PORTABLE CHEST - 1 VIEW COMPARISON:  07/02/2017 FINDINGS: Lungs are clear. Heart size normal. Bilateral hilar fullness stable since 04/22/2017. No effusion. Visualized bones unremarkable. IMPRESSION: No acute cardiopulmonary disease. Electronically Signed   By: Lucrezia Europe M.D.   On: 08/02/2017 15:31        Scheduled Meds: . aspirin  325 mg Oral Daily  . DULoxetine  20 mg Oral Daily  . famotidine  40 mg Oral QHS  . feeding supplement (ENSURE ENLIVE)  237 mL Oral BID BM  . feeding supplement (GLUCERNA SHAKE)  237 mL Oral TID BM  . gabapentin  300 mg Oral Daily  . gabapentin  600 mg Oral QHS  . heparin  5,000 Units Subcutaneous Q8H  . insulin aspart  0-15 Units Subcutaneous TID WC  . mirtazapine  15 mg Oral QHS  . pravastatin  40 mg Oral  q1800   Continuous Infusions: . sodium chloride 75 mL/hr at 08/03/17 0603     LOS: 0 days    I have spent 35 minutes face to face with the patient and on the ward discussing the patients care, assessment, plan and disposition with other care givers. >50% of the time was devoted counseling the patient about the risks and benefits of treatment and coordinating care.     Anubis Fundora Arsenio Loader, MD [facility] Pager [phone]   If 7PM-7AM, please contact night-coverage [email or web address] Password [email or web address] 08/03/2017, 10:22 AM

## 2017-08-04 DIAGNOSIS — F332 Major depressive disorder, recurrent severe without psychotic features: Secondary | ICD-10-CM

## 2017-08-04 DIAGNOSIS — E119 Type 2 diabetes mellitus without complications: Secondary | ICD-10-CM

## 2017-08-04 DIAGNOSIS — F0391 Unspecified dementia with behavioral disturbance: Secondary | ICD-10-CM

## 2017-08-04 DIAGNOSIS — Z79899 Other long term (current) drug therapy: Secondary | ICD-10-CM

## 2017-08-04 DIAGNOSIS — Z634 Disappearance and death of family member: Secondary | ICD-10-CM

## 2017-08-04 LAB — BASIC METABOLIC PANEL
ANION GAP: 7 (ref 5–15)
BUN: 21 mg/dL (ref 8–23)
CHLORIDE: 108 mmol/L (ref 98–111)
CO2: 25 mmol/L (ref 22–32)
Calcium: 8.3 mg/dL — ABNORMAL LOW (ref 8.9–10.3)
Creatinine, Ser: 0.83 mg/dL (ref 0.44–1.00)
GFR calc Af Amer: >60 mL/min (ref >60)
GLUCOSE: 119 mg/dL — AB (ref 70–99)
POTASSIUM: 3.8 mmol/L (ref 3.5–5.1)
Sodium: 140 mmol/L (ref 135–145)

## 2017-08-04 LAB — CBC
HEMATOCRIT: 34.4 % — AB (ref 36.0–46.0)
Hemoglobin: 11.6 g/dL — ABNORMAL LOW (ref 12.0–15.0)
MCH: 32.4 pg (ref 26.0–34.0)
MCHC: 33.7 g/dL (ref 30.0–36.0)
MCV: 96.1 fL (ref 78.0–100.0)
Platelets: 210 10*3/uL (ref 150–400)
RBC: 3.58 MIL/uL — AB (ref 3.87–5.11)
RDW: 14.1 % (ref 11.5–15.5)
WBC: 6.8 10*3/uL (ref 4.0–10.5)

## 2017-08-04 LAB — GLUCOSE, CAPILLARY
Glucose-Capillary: 123 mg/dL — ABNORMAL HIGH (ref 70–99)
Glucose-Capillary: 161 mg/dL — ABNORMAL HIGH (ref 70–99)

## 2017-08-04 LAB — URINE CULTURE

## 2017-08-04 LAB — MAGNESIUM: Magnesium: 1.3 mg/dL — ABNORMAL LOW (ref 1.7–2.4)

## 2017-08-04 LAB — VITAMIN D 25 HYDROXY (VIT D DEFICIENCY, FRACTURES): VIT D 25 HYDROXY: 63 ng/mL (ref 30.0–100.0)

## 2017-08-04 MED ORDER — MAGNESIUM SULFATE 2 GM/50ML IV SOLN
2.0000 g | Freq: Once | INTRAVENOUS | Status: AC
  Administered 2017-08-04: 2 g via INTRAVENOUS
  Filled 2017-08-04: qty 50

## 2017-08-04 MED ORDER — DULOXETINE HCL 20 MG PO CPEP: 40.0000 mg | ORAL_CAPSULE | Freq: Every day | ORAL | 1 refills | Status: AC

## 2017-08-04 MED ORDER — POTASSIUM CHLORIDE CRYS ER 20 MEQ PO TBCR
20.0000 meq | EXTENDED_RELEASE_TABLET | Freq: Once | ORAL | Status: AC
  Administered 2017-08-04: 20 meq via ORAL
  Filled 2017-08-04: qty 1

## 2017-08-04 NOTE — Progress Notes (Signed)
Discharge planning, spoke with patient at beside. Active with [facility] for [facility] services, PT, RN, CSW. Contacted [facility] for referral. [phone]

## 2017-08-04 NOTE — Consult Note (Signed)
[facility] Consult   Reason for Consult:  Flat affect, medication adjustment if needed. Referring Physician:  Dr. Reesa Chew Patient Identification: Melissa Dalton MRN:  [id number] Principal Diagnosis: MDD (major depressive disorder), recurrent severe, without psychosis ([place]) Diagnosis:   Patient Active Problem List   Diagnosis Date Noted  . Essential hypertension [I10] 06/18/2017  . GERD (gastroesophageal reflux disease) [K21.9] 06/18/2017  . Depression with anxiety [F41.8] 06/18/2017  . Sepsis ([place]) [A41.9] 06/17/2017  . UTI (urinary tract infection) [N39.0] 06/17/2017  . AKI (acute kidney injury) ([place]) [N17.9] 06/17/2017  . Acute metabolic encephalopathy [C58.52] 04/24/2017  . Weakness [R53.1] 04/24/2017  . Diabetes mellitus without complication ([place]) [D78.2] 04/24/2017  . CAP (community acquired pneumonia) [J18.9] 04/24/2017  . HLD (hyperlipidemia) [E78.5] 04/24/2017  . Gout [M10.9] 04/24/2017  . Coronary artery calcification [I25.10, I25.84] 12/20/2012  . Multiple lung nodules [R91.8] 10/25/2012  . Chronic cough [R05] 10/25/2012    Total Time spent with patient: 1 hour  Subjective:   Melissa Dalton is a 83 y.o. female patient admitted with AKI.  HPI:   Per chart review, patient was admitted with AKI after presenting with generalized weakness for several weeks and nausea for 2 days. Per PT note, she has not been moving around at home. She has a poor appetite and hypersomnia. She has a flat affect and does not make eye contact during PT evaluation. She has also been experiencing intermittent visual hallucinations and mild confusion at home per daughter. Her husband passed away 2 months ago. pT believe that her physical issues may be related to her depression given a lack of desire and hopelessness for her current condition to improve. She reports that she is "too old to get any better." PT recommends SNF placement. She was recommended for SNF placement at last  hospitalization but she declined. Home medications include Cymbalta DR 20 mg daily.   On interview, [name] reports, "I have had a time for the last few months since my husband passed away" when asked about her mood.  She reports that her husband had been ill but his death was unexpected.  He passed away a couple months ago.  She endorses depression with poor energy, poor appetite, hypersomnia and a 25-30 pound weight loss over the past 2 months.  She still enjoys watching television.  She has been taking Cymbalta for several years.  It was last increased from 20 mg-30 mg daily in April.  She reports medication compliance.  She denies SI, HI or AVH.  She reports intermittently hearing her husband's voice.  Her daughter was at bedside with her verbal consent.  She reports that her mother also believed that she saw worms on the floor and believed that it was snowing last week.  She denies current AVH.  Her daughter reports memory problems.  She is alert and oriented to person place and time.  Past Psychiatric History: Depression and anxiety.   Risk to Self:  None. Denies SI. Risk to Others:  None. Denies HI. Prior Inpatient Therapy:  Denies  Prior Outpatient Therapy:  Her medications are prescribed by her PCP.   Past Medical History:  Past Medical History:  Diagnosis Date  . Anxiety   . Arthritis   . Complication of anesthesia    DIFFICULT TO WAKE AFTER HERNIA OPERATION  . Depression   . Diabetes ([place])   . High cholesterol   . HTN (hypertension)   . PONV (postoperative nausea and vomiting)   . UTI (urinary  tract infection) 06/18/2017    Past Surgical History:  Procedure Laterality Date  . HERNIA REPAIR    . TOTAL ABDOMINAL HYSTERECTOMY    . TUMOR REMOVAL     Family History:  Family History  Problem Relation Age of Onset  . Heart disease Father   . Diabetes Mother    Family Psychiatric  History: Denies  Social History:  Social History   Substance and Sexual Activity   Alcohol Use No     Social History   Substance and Sexual Activity  Drug Use No    Social History   Socioeconomic History  . Marital status: Married    Spouse name: Not on file  . Number of children: Not on file  . Years of education: Not on file  . Highest education level: Not on file  Occupational History  . Occupation: retired Scientist, research (physical sciences)  . Financial resource strain: Not on file  . Food insecurity:    Worry: Not on file    Inability: Not on file  . Transportation needs:    Medical: Not on file    Non-medical: Not on file  Tobacco Use  . Smoking status: Never Smoker  . Smokeless tobacco: Never Used  Substance and Sexual Activity  . Alcohol use: No  . Drug use: No  . Sexual activity: Not on file  Lifestyle  . Physical activity:    Days per week: Not on file    Minutes per session: Not on file  . Stress: Not on file  Relationships  . Social connections:    Talks on phone: Not on file    Gets together: Not on file    Attends religious service: Not on file    Active member of club or organization: Not on file    Attends meetings of clubs or organizations: Not on file    Relationship status: Not on file  Other Topics Concern  . Not on file  Social History Narrative  . Not on file   Additional Social History: She lives at home alone. Her husband passed away 2 months ago. She has 2 daughters. She previously worked as a Regulatory affairs officer.     Allergies:   Allergies  Allergen Reactions  . Oxycodone Nausea And Vomiting  . Penicillins Hives and Rash    Has patient had a PCN reaction causing immediate rash, facial/tongue/throat swelling, SOB or lightheadedness with hypotension: Yes Has patient had a PCN reaction causing severe rash involving mucus membranes or skin necrosis: No Has patient had a PCN reaction that required hospitalization: No Has patient had a PCN reaction occurring within the last 10 years: No If all of the above answers are "NO", then may  proceed with Cephalosporin use.   . Tramadol Nausea And Vomiting    Labs:  Results for orders placed or performed during the hospital encounter of 08/02/17 (from the past 48 hour(s))  Lipase, blood     Status: None   Collection Time: 08/02/17  1:11 PM  Result Value Ref Range   Lipase 26 11 - 51 U/L    Comment: Performed at [facility], [place] [address] [place], [place] [address]  Comprehensive metabolic panel     Status: Abnormal   Collection Time: 08/02/17  1:11 PM  Result Value Ref Range   Sodium 133 (L) 135 - 145 mmol/L   Potassium 4.5 3.5 - 5.1 mmol/L   Chloride 97 (L) 98 - 111 mmol/L    Comment: Please  note change in reference range.   CO2 18 (L) 22 - 32 mmol/L   Glucose, Bld 139 (H) 70 - 99 mg/dL    Comment: Please note change in reference range.   BUN 37 (H) 8 - 23 mg/dL    Comment: Please note change in reference range.   Creatinine, Ser 1.61 (H) 0.44 - 1.00 mg/dL   Calcium 9.4 8.9 - 10.3 mg/dL   Total Protein 6.8 6.5 - 8.1 g/dL   Albumin 3.6 3.5 - 5.0 g/dL   AST 33 15 - 41 U/L   ALT 22 0 - 44 U/L    Comment: Please note change in reference range.   Alkaline Phosphatase 46 38 - 126 U/L   Total Bilirubin 0.9 0.3 - 1.2 mg/dL   GFR calc non Af Amer 29 (L) >60 mL/min   GFR calc Af Amer 34 (L) >60 mL/min    Comment: (NOTE) The eGFR has been calculated using the CKD EPI equation. This calculation has not been validated in all clinical situations. eGFR's persistently <60 mL/min signify possible Chronic Kidney Disease.    Anion gap 18 (H) 5 - 15    Comment: Performed at [facility], [place] [address] [place], [place] [address]  CBC     Status: Abnormal   Collection Time: 08/02/17  1:11 PM  Result Value Ref Range   WBC 11.0 (H) 4.0 - 10.5 K/uL   RBC 4.32 3.87 - 5.11 MIL/uL   Hemoglobin 13.9 12.0 - 15.0 g/dL   HCT 40.6 36.0 - 46.0 %   MCV 94.0 78.0 - 100.0 fL   MCH 32.2 26.0 - 34.0 pg   MCHC 34.2 30.0 - 36.0 g/dL   RDW 13.9 11.5  - 15.5 %   Platelets 300 150 - 400 K/uL    Comment: Performed at [facility], [place] [address], [place], [place] [address]  I-stat troponin, ED     Status: None   Collection Time: 08/02/17  3:23 PM  Result Value Ref Range   Troponin i, poc 0.01 0.00 - 0.08 ng/mL   Comment 3            Comment: Due to the release kinetics of cTnI, a negative result within the first hours of the onset of symptoms does not rule out myocardial infarction with certainty. If myocardial infarction is still suspected, repeat the test at appropriate intervals.   Urinalysis, Routine w reflex microscopic     Status: Abnormal   Collection Time: 08/02/17  4:27 PM  Result Value Ref Range   Color, Urine YELLOW YELLOW   APPearance HAZY (A) CLEAR   Specific Gravity, Urine 1.015 1.005 - 1.030   pH 5.0 5.0 - 8.0   Glucose, UA NEGATIVE NEGATIVE mg/dL   Hgb urine dipstick SMALL (A) NEGATIVE   Bilirubin Urine NEGATIVE NEGATIVE   Ketones, ur NEGATIVE NEGATIVE mg/dL   Protein, ur NEGATIVE NEGATIVE mg/dL   Nitrite NEGATIVE NEGATIVE   Leukocytes, UA MODERATE (A) NEGATIVE   RBC / HPF 0-5 0 - 5 RBC/hpf   WBC, UA 6-10 0 - 5 WBC/hpf   Bacteria, UA RARE (A) NONE SEEN   Squamous Epithelial / LPF 11-20 0 - 5   Mucus PRESENT    Hyaline Casts, UA PRESENT     Comment: Performed at [facility], [place] [address], [place], [place] [address]  Creatinine, urine, random     Status: None   Collection Time: 08/02/17  4:27 PM  Result Value Ref Range   Creatinine, Urine 147.42 mg/dL    Comment: Performed at [facility], [place] [address], [place], [place] [address]  Culture, Urine     Status: Abnormal   Collection Time: 08/02/17  4:27 PM  Result Value Ref Range   Specimen Description      URINE, RANDOM Performed at [facility], [place] [address], [place], [place] [address]    Special Requests      NONE Performed at [facility], [place]  [address], [place], [place] [address]    Culture MULTIPLE SPECIES PRESENT, SUGGEST RECOLLECTION (A)    Report Status 08/04/2017 FINAL   Na and K (sodium & potassium), rand urine     Status: None   Collection Time: 08/02/17  4:27 PM  Result Value Ref Range   Sodium, Ur <10 mmol/L   Potassium Urine 66 mmol/L    Comment: Performed at [facility], [place] [address] [place], [place] [address]  Osmolality, urine     Status: None   Collection Time: 08/02/17  4:27 PM  Result Value Ref Range   Osmolality, Ur 400 300 - 900 mOsm/kg    Comment: Performed at [place] [address], [place], [place] [address]  Glucose, capillary     Status: None   Collection Time: 08/02/17  5:36 PM  Result Value Ref Range   Glucose-Capillary 75 70 - 99 mg/dL   Comment 1 Notify RN   TSH     Status: None   Collection Time: 08/02/17  6:16 PM  Result Value Ref Range   TSH 1.066 0.350 - 4.500 uIU/mL    Comment: Performed by a 3rd Generation assay with a functional sensitivity of <=0.01 uIU/mL. Performed at [facility], [place] [address] [place], [place] [address]   Vitamin B12     Status: None   Collection Time: 08/02/17  6:16 PM  Result Value Ref Range   Vitamin B-12 388 180 - 914 pg/mL    Comment: (NOTE) This assay is not validated for testing neonatal or myeloproliferative syndrome specimens for Vitamin B12 levels. Performed at [facility], [place] [address] [place], [place] [address]   Glucose, capillary     Status: None   Collection Time: 08/02/17  9:49 PM  Result Value Ref Range   Glucose-Capillary 97 70 - 99 mg/dL  Hemoglobin A1c     Status: Abnormal   Collection Time: 08/03/17  4:27 AM  Result Value Ref Range   Hgb A1c MFr Bld 6.0 (H) 4.8 - 5.6 %    Comment: (NOTE) Pre diabetes:          5.7%-6.4% Diabetes:              >6.4% Glycemic control for   <7.0% adults with diabetes    Mean Plasma Glucose 125.5 mg/dL    Comment:  Performed at [place] [address], [place], [place] [address]  Basic metabolic panel     Status: Abnormal   Collection Time: 08/03/17  4:27 AM  Result Value Ref Range   Sodium 138 135 - 145 mmol/L   Potassium 4.3 3.5 - 5.1 mmol/L   Chloride 105 98 - 111 mmol/L    Comment: Please note change in reference range.   CO2 25 22 - 32 mmol/L   Glucose, Bld 93 70 - 99 mg/dL    Comment: Please note change in reference range.  BUN 32 (H) 8 - 23 mg/dL    Comment: Please note change in reference range.   Creatinine, Ser 1.25 (H) 0.44 - 1.00 mg/dL   Calcium 8.6 (L) 8.9 - 10.3 mg/dL   GFR calc non Af Amer 40 (L) >60 mL/min   GFR calc Af Amer 46 (L) >60 mL/min    Comment: (NOTE) The eGFR has been calculated using the CKD EPI equation. This calculation has not been validated in all clinical situations. eGFR's persistently <60 mL/min signify possible Chronic Kidney Disease.    Anion gap 8 5 - 15    Comment: Performed at [facility], [place] [address] [place], [place] [address]  CBC with Differential/Platelet     Status: Abnormal   Collection Time: 08/03/17  4:27 AM  Result Value Ref Range   WBC 8.6 4.0 - 10.5 K/uL   RBC 3.80 (L) 3.87 - 5.11 MIL/uL   Hemoglobin 12.1 12.0 - 15.0 g/dL   HCT 36.2 36.0 - 46.0 %   MCV 95.3 78.0 - 100.0 fL   MCH 31.8 26.0 - 34.0 pg   MCHC 33.4 30.0 - 36.0 g/dL   RDW 14.2 11.5 - 15.5 %   Platelets 236 150 - 400 K/uL   Neutrophils Relative % 51 %   Neutro Abs 4.4 1.7 - 7.7 K/uL   Lymphocytes Relative 37 %   Lymphs Abs 3.2 0.7 - 4.0 K/uL   Monocytes Relative 9 %   Monocytes Absolute 0.8 0.1 - 1.0 K/uL   Eosinophils Relative 3 %   Eosinophils Absolute 0.3 0.0 - 0.7 K/uL   Basophils Relative 0 %   Basophils Absolute 0.0 0.0 - 0.1 K/uL    Comment: Performed at [facility], [place] [address] [place], [place] [address]  Glucose, capillary     Status: None   Collection Time: 08/03/17  7:23 AM  Result Value Ref Range    Glucose-Capillary 90 70 - 99 mg/dL  Glucose, capillary     Status: Abnormal   Collection Time: 08/03/17 11:38 AM  Result Value Ref Range   Glucose-Capillary 115 (H) 70 - 99 mg/dL  Glucose, capillary     Status: Abnormal   Collection Time: 08/03/17  4:53 PM  Result Value Ref Range   Glucose-Capillary 167 (H) 70 - 99 mg/dL  Glucose, capillary     Status: Abnormal   Collection Time: 08/03/17  8:25 PM  Result Value Ref Range   Glucose-Capillary 158 (H) 70 - 99 mg/dL  Basic metabolic panel     Status: Abnormal   Collection Time: 08/04/17  4:36 AM  Result Value Ref Range   Sodium 140 135 - 145 mmol/L   Potassium 3.8 3.5 - 5.1 mmol/L   Chloride 108 98 - 111 mmol/L    Comment: Please note change in reference range.   CO2 25 22 - 32 mmol/L   Glucose, Bld 119 (H) 70 - 99 mg/dL    Comment: Please note change in reference range.   BUN 21 8 - 23 mg/dL    Comment: Please note change in reference range.   Creatinine, Ser 0.83 0.44 - 1.00 mg/dL   Calcium 8.3 (L) 8.9 - 10.3 mg/dL   GFR calc non Af Amer >60 >60 mL/min   GFR calc Af Amer >60 >60 mL/min    Comment: (NOTE) The eGFR has been calculated using the CKD EPI equation. This calculation has not been validated in all clinical situations. eGFR's persistently <60 mL/min signify possible  Chronic Kidney Disease.    Anion gap 7 5 - 15    Comment: Performed at [facility], [place] [address], [place], [place] [address]  CBC     Status: Abnormal   Collection Time: 08/04/17  4:36 AM  Result Value Ref Range   WBC 6.8 4.0 - 10.5 K/uL   RBC 3.58 (L) 3.87 - 5.11 MIL/uL   Hemoglobin 11.6 (L) 12.0 - 15.0 g/dL   HCT 34.4 (L) 36.0 - 46.0 %   MCV 96.1 78.0 - 100.0 fL   MCH 32.4 26.0 - 34.0 pg   MCHC 33.7 30.0 - 36.0 g/dL   RDW 14.1 11.5 - 15.5 %   Platelets 210 150 - 400 K/uL    Comment: Performed at [facility], [place] [address], [place], [place] [address]  Magnesium     Status: Abnormal   Collection Time:  08/04/17  4:36 AM  Result Value Ref Range   Magnesium 1.3 (L) 1.7 - 2.4 mg/dL    Comment: Performed at [facility], [place] [address] [place], [place] [address]  Glucose, capillary     Status: Abnormal   Collection Time: 08/04/17  7:59 AM  Result Value Ref Range   Glucose-Capillary 123 (H) 70 - 99 mg/dL    Current Facility-Administered Medications  Medication Dose Route Frequency Provider Last Rate Last Dose  . 0.9 %  sodium chloride infusion   Intravenous Continuous Patrecia Pour, Christean Grief, MD 75 mL/hr at 08/04/17 [phone]    . acetaminophen (TYLENOL) tablet 650 mg  650 mg Oral Q6H PRN Patrecia Pour, Christean Grief, MD       Or  . acetaminophen (TYLENOL) suppository 650 mg  650 mg Rectal Q6H PRN Patrecia Pour, Christean Grief, MD      . aspirin tablet 325 mg  325 mg Oral Daily Patrecia Pour, Christean Grief, MD   325 mg at 08/04/17 [phone]  . DULoxetine (CYMBALTA) DR capsule 20 mg  20 mg Oral Daily Patrecia Pour, Christean Grief, MD   20 mg at 08/04/17 [phone]  . famotidine (PEPCID) tablet 40 mg  40 mg Oral QHS Patrecia Pour, Christean Grief, MD   40 mg at 08/03/17 2148  . feeding supplement (ENSURE ENLIVE) (ENSURE ENLIVE) liquid 237 mL  237 mL Oral BID BM Patrecia Pour, Christean Grief, MD   237 mL at 08/03/17 1447  . feeding supplement (GLUCERNA SHAKE) (GLUCERNA SHAKE) liquid 237 mL  237 mL Oral TID BM Patrecia Pour, Christean Grief, MD   237 mL at 08/03/17 1935  . gabapentin (NEURONTIN) capsule 300 mg  300 mg Oral Daily Patrecia Pour, Christean Grief, MD   300 mg at 08/04/17 0952  . heparin injection 5,000 Units  5,000 Units Subcutaneous Q8H Patrecia Pour, Christean Grief, MD   5,000 Units at 08/04/17 0550  . hydrALAZINE (APRESOLINE) injection 5 mg  5 mg Intravenous Q4H PRN Patrecia Pour, Christean Grief, MD      . insulin aspart (novoLOG) injection 0-15 Units  0-15 Units Subcutaneous TID WC Patrecia Pour, Christean Grief, MD   2 Units at 08/04/17 0825  . mirtazapine (REMERON) tablet 15 mg  15 mg Oral QHS Patrecia Pour, Christean Grief, MD   15 mg at 08/03/17 2149  . ondansetron (ZOFRAN) tablet 4 mg  4 mg Oral  Q6H PRN Patrecia Pour, Christean Grief, MD       Or  . ondansetron [facility]) injection 4 mg  4 mg Intravenous Q6H PRN Patrecia Pour, Christean Grief, MD      . pravastatin (PRAVACHOL) tablet 40 mg  40 mg  Oral q1800 Patrecia Pour, Christean Grief, MD   40 mg at 08/03/17 1804  . senna-docusate (Senokot-S) tablet 1 tablet  1 tablet Oral QHS PRN Doreatha Lew, MD        Musculoskeletal: Strength & Muscle Tone: decreased due to physical deconditioning.  Gait & Station: UTA since patient is lying in bed. Patient leans: N/A  Psychiatric Specialty Exam: Physical Exam  Nursing note and vitals reviewed. Constitutional: She is oriented to person, place, and time. She appears well-developed and well-nourished.  HENT:  Head: Normocephalic and atraumatic.  Neck: Normal range of motion.  Respiratory: Effort normal.  Musculoskeletal: Normal range of motion.  Neurological: She is alert and oriented to person, place, and time.  Skin: No rash noted.  Psychiatric: Her speech is normal and behavior is normal. Judgment and thought content normal. Cognition and memory are normal. She exhibits a depressed mood.    Review of Systems  Constitutional: Negative for chills and fever.  Cardiovascular: Negative for chest pain.  Gastrointestinal: Negative for abdominal pain, constipation, diarrhea, nausea and vomiting.  Psychiatric/Behavioral: Positive for depression. Negative for hallucinations, substance abuse and suicidal ideas. The patient does not have insomnia.   All other systems reviewed and are negative.   Blood pressure 124/65, pulse 73, temperature 97.8 F (36.6 C), temperature source Oral, resp. rate 18, height '5\' 2"'  (1.575 m), weight 87.5 kg (193 lb), SpO2 96 %.Body mass index is 35.3 kg/m.  General Appearance: Fairly Groomed, elderly, Caucasian female, wearing a hospital gown with short hair and lying in bed while eating lunch. NAD.   Eye Contact:  Good  Speech:  Clear and Coherent and Normal Rate  Volume:  Normal  Mood:   Depressed  Affect:  Congruent and Tearful  Thought Process:  Goal Directed, Linear and Descriptions of Associations: Intact  Orientation:  Full (Time, Place, and Person)  Thought Content:  Logical  Suicidal Thoughts:  No  Homicidal Thoughts:  No  Memory:  Immediate;   Good Recent;   Good Remote;   Good  Judgement:  Fair  Insight:  Fair  Psychomotor Activity:  Normal  Concentration:  Concentration: Good and Attention Span: Good  Recall:  Good  Fund of Knowledge:  Good  Language:  Good  Akathisia:  No  Handed:  Right  AIMS (if indicated):   N/A  Assets:  Communication Skills Housing Social Support  ADL's:  Impaired  Cognition:  WNL  Sleep:    Patient reports hypersomnia.    Assessment:  Melissa Dalton is a 83 y.o. female who was admitted with AKI. She endorses depression with neurovegetative symptoms for the past 2 months since her husband passed away. She is agreeable to increasing Cymbalta for depression. She denies SI, HI or AVH and is not psychotic. She does not warrant inpatient psychiatric hospitalization at this time.   Treatment Plan Summary: -Increase Cymbalta 30 mg daily to 40 mg daily for depression. Patient is prescribed Cymbalta 20 mg daily in the hospital but patient's daughter reports that she is taking 30 mg daily at home. -Patient should follow up with her PCP for further medication management.  -Please have SW provide patient with resources for grief counseling.  -Psychiatry will sign off on patient at this time. Please consult psychiatry again as needed.   Disposition: No evidence of imminent risk to self or others at present.   Patient does not meet criteria for psychiatric inpatient admission.  Faythe Dingwall, DO 08/04/2017 11:04 AM

## 2017-08-04 NOTE — Evaluation (Signed)
Occupational Therapy Evaluation Patient Details Name: Melissa Dalton MRN: [id number] DOB: 16-Nov-1934 Today's Date: 08/04/2017    History of Present Illness 83 year old female with history of essential hypertension, GERD, diabetes mellitus type 2, hyperlipidemia, depression, anxiety came to the hospital with complains of generalized weakness which has been ongoing for several weeks now; therefore admitted to the hospital for acute kidney injury and concerns for failure to thrive    Clinical Impression   Pt with decline in function and safety with ADLs and ADL mobility with decreased strength, balance and endurance. Pt would benefit from acute OT services to maximize level of function and safety    Follow Up Recommendations  No OT follow up;Supervision - Intermittent    Equipment Recommendations  Tub/shower seat    Recommendations for Other Services       Precautions / Restrictions Precautions Precautions: Fall Restrictions Weight Bearing Restrictions: No      Mobility Bed Mobility   Bed Mobility: Supine to Sit     Supine to sit: Min guard        Transfers Overall transfer level: Needs assistance Equipment used: None;Standard walker;Rolling walker (2 wheeled) Transfers: Sit to/from Stand Sit to Stand: Min guard              Balance   Sitting-balance support: Feet supported;No upper extremity supported Sitting balance-Leahy Scale: Fair     Standing balance support: Single extremity supported;Bilateral upper extremity supported;No upper extremity supported;During functional activity Standing balance-Leahy Scale: Poor                             ADL either performed or assessed with clinical judgement   ADL Overall ADL's : Needs assistance/impaired     Grooming: Wash/dry hands;Wash/dry face;Min guard;Standing   Upper Body Bathing: Min guard;Standing   Lower Body Bathing: Min guard;Sit to/from stand   Upper Body Dressing : Min  guard;Standing   Lower Body Dressing: Min guard;Sit to/from stand   Toilet Transfer: Min guard   [place] and Hygiene: Min guard;Sit to/from stand   Tub/ Shower Transfer: Minimal assistance;Min guard;Ambulation;Rolling walker;Grab bars   Functional mobility during ADLs: Rolling walker;Cueing for safety;Min guard       Vision Baseline Vision/History: Wears glasses Wears Glasses: Reading only Patient Visual Report: No change from baseline       Perception     Praxis      Pertinent Vitals/Pain Pain Assessment: No/denies pain     Hand Dominance Right   Extremity/Trunk Assessment Upper Extremity Assessment Upper Extremity Assessment: Generalized weakness   Lower Extremity Assessment Lower Extremity Assessment: Defer to PT evaluation   Cervical / Trunk Assessment Cervical / Trunk Assessment: Normal   Communication Communication Communication: No difficulties   Cognition Arousal/Alertness: Awake/alert Behavior During Therapy: Flat affect Overall Cognitive Status: No family/caregiver present to determine baseline cognitive functioning Area of Impairment: Following commands;Safety/judgement                       Following Commands: Follows one step commands with increased time Safety/Judgement: Decreased awareness of deficits         General Comments       Exercises     Shoulder Instructions      Home Living Family/patient expects to be discharged to:: Private residence Living Arrangements: Alone Available Help at Discharge: Other (Comment)(daughters live out of town) Type of Home: House Home Access: Stairs to enter;Ramped entrance [employer]  of Steps: 2-3 Entrance Stairs-Rails: None Home Layout: One level     Bathroom Shower/Tub: Tub/shower unit;Walk-in shower   Bathroom Toilet: Standard Bathroom Accessibility: Yes   Home Equipment: Walker - 2 wheels;Wheelchair - manual   Additional Comments: pt  states she walks and does everything, dtrs report to PT that pt sleeps most of day, will not eat or even walk to the kitchen at home; 1 dtr has been stayign with her but cannot continue being her 24hr caregiver      Prior Functioning/Environment                   OT Problem List: Decreased strength;Decreased activity tolerance;Impaired balance (sitting and/or standing);Decreased knowledge of use of DME or AE      OT Treatment/Interventions: Self-care/ADL training;Therapeutic exercise;DME and/or AE instruction;Neuromuscular education;Therapeutic activities;Patient/family education    OT Goals(Current goals can be found in the care plan section) Acute Rehab OT Goals Patient Stated Goal: to go home OT Goal Formulation: With patient Time For Goal Achievement: 08/18/17 Potential to Achieve Goals: Good ADL Goals Pt Will Perform Grooming: with supervision;with set-up;standing Pt Will Perform Upper Body Bathing: with supervision;with set-up;sitting;standing Pt Will Perform Lower Body Bathing: with supervision;with set-up;sitting/lateral leans;sit to/from stand Pt Will Perform Upper Body Dressing: with supervision;with set-up;sitting;standing Pt Will Perform Lower Body Dressing: with supervision;with set-up;sitting/lateral leans;sit to/from stand Pt Will Transfer to Toilet: with supervision;ambulating Pt Will Perform Toileting - Clothing Manipulation and hygiene: with supervision;sit to/from stand Pt Will Perform Tub/Shower Transfer: with min guard assist;with supervision;ambulating;shower seat;grab bars  OT Frequency: Min 2X/week   Barriers to D/C:            Co-evaluation              AM-PAC PT "6 Clicks" Daily Activity     Outcome Measure Help from another person eating meals?: None Help from another person taking care of personal grooming?: A Little Help from another person toileting, which includes using toliet, bedpan, or urinal?: A Little Help from another person  bathing (including washing, rinsing, drying)?: A Little Help from another person to put on and taking off regular upper body clothing?: A Little Help from another person to put on and taking off regular lower body clothing?: A Little 6 Click Score: 19   End of Session Equipment Utilized During Treatment: Rolling walker;Gait belt  Activity Tolerance: Patient tolerated treatment well Patient left: in chair;with call bell/phone within reach  OT Visit Diagnosis: Muscle weakness (generalized) (M62.81)                Time: 8889-1694 OT Time Calculation (min): 25 min Charges:  OT General Charges $OT Visit: 1 Visit OT Evaluation $OT Eval Moderate Complexity: 1 Mod G-Codes: OT G-codes **NOT FOR INPATIENT CLASS** Functional Assessment Tool Used: AM-PAC 6 Clicks Daily Activity     Britt Bottom 08/04/2017, 1:50 PM

## 2017-08-04 NOTE — Discharge Summary (Addendum)
Physician Discharge Summary  Melissa Dalton [facility] RDE:[id number] DOB: Sep 15, 1934 DOA: 08/02/2017  PCP: Vicenta Aly, FNP  Admit date: 08/02/2017 Discharge date: 08/04/2017  Admitted From: Home  Disposition:  Home   Recommendations for Outpatient Follow-up:  1. Follow up with PCP in 1 weeks 2. Please obtain BMP/CBC in one week your next doctors visit.  3. Cymbalta increased to 40 mg daily 4. Follow-up outpatient with psychiatry  Home Health: RN and aide Equipment/Devices: None  Discharge Condition: Stable CODE STATUS: Full  Diet recommendation: Regular   Brief/Interim Summary: 83 year old female with history of essential hypertension, GERD, diabetes mellitus type 2, hyperlipidemia, depression, anxiety came to the hospital with complains of generalized weakness which has been ongoing for several weeks now.  Her appetite has not been well and feeling nauseous as well for the past 2 days.  Due to this she has mostly been bedbound.  Upon admission she was found to have acute kidney injury and concerns for failure to thrive therefore admitted to the hospital for further care. During her hospitalization patient was given IV fluids which helped her creatinine trended down from 1.6 to her baseline around 0.7.  She was tolerating oral diet and was evaluated by speech and swallow who recommended regular diet with basic aspiration precaution.  She was also evaluated by physical therapy who recommended skilled nursing facility but patient adamantly refused to go to a rehab or even get home physical therapy.  After having a prolonged discussion with the patient's daughter who stated patient will not get any sort of rehabilitation at home and she would not participate but would help if we can arrange for home nursing aide due to patient's advanced dementia, deconditioning. Unfortunately patient's husband had recently passed away about 2 months ago and since then patient has been more depressed and detached.   She has been on Cymbalta as outpatient therefore I consulted psychiatry for their input regarding if any medication adjustment is necessary.  Psychiatry recommended increasing her Cymbalta to 40 mg daily and then outpatient follow-up with psych.  Otherwise she has reached maximum benefit from an hospital stay and stable to be discharged with outpatient follow-up recommendations as stated above.    Discharge Diagnoses:  Active Problems:   AKI (acute kidney injury) ([place])  AKI (acute kidney injury) ([place])  Acute kidney injury; resolved -Admission creatinine was 1.6 which is trended down to 0.8.  Her baseline creatinine 0.7 - Patient has been advised to continue to hydrate herself at home.  Encourage oral intake.  Failure to thrive due to poor oral intake -Encourage oral intake.  Appreciate speech and swallow evaluation recommended regular diet with mild aspiration precaution.  Patient needs been advised to continue to eat and drink as much and possible.  Generalized weakness - PT/OT- recommended skilled nursing facility but patient is adamantly refusing to go to rehab or even get home physical therapy.  I had a detailed discussion with the patient's daughter about this and she agrees that patient will continue to refuse this therefore does not want any sort of rehabilitation at this time. - TSH-within normal limits him on B12 within normal limits -Vitamin D levels WNL  Diabetes mellitus type 2 -resume home meds.  Sliding scale and Accu-Chek -Hemoglobin A1c- 6.0  Essential hypertension -resume home meds  Depression and anxiety -Cymbalta and gabapentin.  Psychiatry consulted for medication recommendations-recommended increasing Cymbalta to 40 mg daily.  Hyperlipidemia -Continue statin  Dementia -Supportive care  Patient on subcutaneous heparin for DVT prophylaxis while  here She is a full code  I had detailed discussion with the patient's daughter, Melissa Dalton regarding her  overall care.  She agrees that patient continues to refuse rehabilitation but request if we can help arrange for home aide and RN to help manage her condition at home as family member cannot be around all the time at home.  We will try and arrange for this for patient safety.  Discharge Instructions   Allergies as of 08/04/2017      Reactions   Oxycodone Nausea And Vomiting   Penicillins Hives, Rash   Has patient had a PCN reaction causing immediate rash, facial/tongue/throat swelling, SOB or lightheadedness with hypotension: Yes Has patient had a PCN reaction causing severe rash involving mucus membranes or skin necrosis: No Has patient had a PCN reaction that required hospitalization: No Has patient had a PCN reaction occurring within the last 10 years: No If all of the above answers are "NO", then may proceed with Cephalosporin use.   Tramadol Nausea And Vomiting      Medication List    TAKE these medications   acetaminophen 500 MG tablet Commonly known as:  TYLENOL Take 500 mg by mouth every 6 (six) hours as needed (for pain or headaches).   aspirin 325 MG tablet Take 325 mg by mouth daily.   AZO BLADDER CONTROL/GO-LESS Caps Take 2 capsules by mouth at bedtime as needed (when bladder issues arise).   DULoxetine 20 MG capsule Commonly known as:  CYMBALTA Take 1 capsule (20 mg total) by mouth daily.   fexofenadine 180 MG tablet Commonly known as:  ALLEGRA Take 180 mg by mouth daily.   gabapentin 300 MG capsule Commonly known as:  NEURONTIN Take 300 mg by mouth every morning.   glipiZIDE 10 MG tablet Commonly known as:  GLUCOTROL Take 10 mg by mouth 2 (two) times daily before a meal.   KLOR-CON M20 20 MEQ tablet Generic drug:  potassium chloride SA Take 20 mEq by mouth 2 (two) times daily.   losartan 50 MG tablet Commonly known as:  COZAAR Take 50 mg by mouth daily.   metFORMIN 1000 MG tablet Commonly known as:  GLUCOPHAGE Take 1,000 mg by mouth 2 (two) times  daily.   multivitamin with minerals tablet Take 1 tablet by mouth daily.   pravastatin 40 MG tablet Commonly known as:  PRAVACHOL Take 40 mg by mouth every evening.   Probiotic Caps Take 1 capsule by mouth daily.   ranitidine 300 MG tablet Commonly known as:  ZANTAC Take 300 mg by mouth daily.   Vitamin D-3 1000 units Caps Take 1,000 Units by mouth daily.      Follow-up Information    Vicenta Aly, [place]. Schedule an appointment as soon as possible for a visit in 1 week(s).   Specialty:  Nurse Practitioner Contact information: [place] [address] [phone]          Allergies  Allergen Reactions  . Oxycodone Nausea And Vomiting  . Penicillins Hives and Rash    Has patient had a PCN reaction causing immediate rash, facial/tongue/throat swelling, SOB or lightheadedness with hypotension: Yes Has patient had a PCN reaction causing severe rash involving mucus membranes or skin necrosis: No Has patient had a PCN reaction that required hospitalization: No Has patient had a PCN reaction occurring within the last 10 years: No If all of the above answers are "NO", then may proceed with Cephalosporin use.   . Tramadol Nausea  And Vomiting    You were cared for by a hospitalist during your hospital stay. If you have any questions about your discharge medications or the care you received while you were in the hospital after you are discharged, you can call the unit and asked to speak with the hospitalist on call if the hospitalist that took care of you is not available. Once you are discharged, your primary care physician will handle any further medical issues. Please note that no refills for any discharge medications will be authorized once you are discharged, as it is imperative that you return to your primary care physician (or establish a relationship with a primary care physician if you do not have one) for your aftercare needs so that they can  reassess your need for medications and monitor your lab values.  Consultations:  Psych   Procedures/Studies: Dg Chest Port 1 View  Result Date: 08/02/2017 CLINICAL DATA:  vomiting and weakness. Pt's family reports that she has barely been eating for the past couple of months. Pt also has diabetes. Pt's home nurse reported that she was concerned for the pt and that she needed to come to the hospital. Pt is very weak, requires two people to help her from the wheelchair to the bedNever a smoker - diabetic - hypertension EXAM: PORTABLE CHEST - 1 VIEW COMPARISON:  07/02/2017 FINDINGS: Lungs are clear. Heart size normal. Bilateral hilar fullness stable since 04/22/2017. No effusion. Visualized bones unremarkable. IMPRESSION: No acute cardiopulmonary disease. Electronically Signed   By: Lucrezia Europe M.D.   On: 08/02/2017 15:31      Subjective: No complaints, laying in bed.  She has somewhat of a flat affect.  General = no fevers, chills, dizziness, malaise, fatigue HEENT/EYES = negative for pain, redness, loss of vision, double vision, blurred vision, loss of hearing, sore throat, hoarseness, dysphagia Cardiovascular= negative for chest pain, palpitation, murmurs, lower extremity swelling Respiratory/lungs= negative for shortness of breath, cough, hemoptysis, wheezing, mucus production Gastrointestinal= negative for nausea, vomiting,, abdominal pain, melena, hematemesis Genitourinary= negative for Dysuria, Hematuria, Change in Urinary Frequency MSK = Negative for arthralgia, myalgias, Back Pain, Joint swelling  Neurology= Negative for headache, seizures, numbness, tingling  Psychiatry= Negative for anxiety, depression, suicidal and homocidal ideation Allergy/Immunology= Medication/Food allergy as listed  Skin= Negative for Rash, lesions, ulcers, itching   Discharge Exam: Vitals:   08/03/17 2025 08/04/17 0545  BP: (!) 113/55 124/65  Pulse: 73 73  Resp: 18 18  Temp: 98.7 F (37.1 C) 97.8  F (36.6 C)  SpO2: 96% 96%   Vitals:   08/03/17 0407 08/03/17 1252 08/03/17 2025 08/04/17 0545  BP: (!) 117/50 126/69 (!) 113/55 124/65  Pulse: 63 75 73 73  Resp: 18 (!) 22 18 18   Temp: 97.9 F (36.6 C) 98.1 F (36.7 C) 98.7 F (37.1 C) 97.8 F (36.6 C)  TempSrc: Oral Oral Oral Oral  SpO2: 97% 100% 96% 96%  Weight:      Height:        General: Pt is alert, awake, not in acute distress; elderly frail  Cardiovascular: RRR, S1/S2 +, no rubs, no gallops Respiratory: CTA bilaterally, no wheezing, no rhonchi Abdominal: Soft, NT, ND, bowel sounds + Extremities: no edema, no cyanosis Flat affect.   The results of significant diagnostics from this hospitalization (including imaging, microbiology, ancillary and laboratory) are listed below for reference.     Microbiology: Recent Results (from the past 240 hour(s))  Culture, Urine     Status: Abnormal  Collection Time: 08/02/17  4:27 PM  Result Value Ref Range Status   Specimen Description   Final    URINE, RANDOM Performed at [facility] [address], [place], [place] [address]    Special Requests   Final    NONE Performed at [facility], [place] [address], [place], [place] [address]    Culture MULTIPLE SPECIES PRESENT, SUGGEST RECOLLECTION (A)  Final   Report Status 08/04/2017 FINAL  Final     Labs: BNP (last 3 results) Recent Labs    05/18/17 2254 06/17/17 2345  BNP 47.6 29.9   Basic Metabolic Panel: Recent Labs  Lab 08/02/17 1311 08/03/17 0427 08/04/17 0436  NA 133* 138 140  K 4.5 4.3 3.8  CL 97* 105 108  CO2 18* 25 25  GLUCOSE 139* 93 119*  BUN 37* 32* 21  CREATININE 1.61* 1.25* 0.83  CALCIUM 9.4 8.6* 8.3*  MG  --   --  1.3*   Liver Function Tests: Recent Labs  Lab 08/02/17 1311  AST 33  ALT 22  ALKPHOS 46  BILITOT 0.9  PROT 6.8  ALBUMIN 3.6   Recent Labs  Lab 08/02/17 1311  LIPASE 26   No results for input(s): AMMONIA in the last 168  hours. CBC: Recent Labs  Lab 08/02/17 1311 08/03/17 0427 08/04/17 0436  WBC 11.0* 8.6 6.8  NEUTROABS  --  4.4  --   HGB 13.9 12.1 11.6*  HCT 40.6 36.2 34.4*  MCV 94.0 95.3 96.1  PLT 300 236 210   Cardiac Enzymes: No results for input(s): CKTOTAL, CKMB, CKMBINDEX, TROPONINI in the last 168 hours. BNP: Invalid input(s): POCBNP CBG: Recent Labs  Lab 08/03/17 1138 08/03/17 1653 08/03/17 2025 08/04/17 0759 08/04/17 1203  GLUCAP 115* 167* 158* 123* 161*   D-Dimer No results for input(s): DDIMER in the last 72 hours. Hgb A1c Recent Labs    08/03/17 0427  HGBA1C 6.0*   Lipid Profile No results for input(s): CHOL, HDL, LDLCALC, TRIG, CHOLHDL, LDLDIRECT in the last 72 hours. Thyroid function studies Recent Labs    08/02/17 1816  TSH 1.066   Anemia work up Recent Labs    08/02/17 1816  VITAMINB12 388   Urinalysis    Component Value Date/Time   COLORURINE YELLOW 08/02/2017 1627   APPEARANCEUR HAZY (A) 08/02/2017 1627   LABSPEC 1.015 08/02/2017 1627   PHURINE 5.0 08/02/2017 1627   GLUCOSEU NEGATIVE 08/02/2017 1627   HGBUR SMALL (A) 08/02/2017 1627   BILIRUBINUR NEGATIVE 08/02/2017 1627   KETONESUR NEGATIVE 08/02/2017 1627   PROTEINUR NEGATIVE 08/02/2017 1627   NITRITE NEGATIVE 08/02/2017 1627   LEUKOCYTESUR MODERATE (A) 08/02/2017 1627   Sepsis Labs Invalid input(s): PROCALCITONIN,  WBC,  LACTICIDVEN Microbiology Recent Results (from the past 240 hour(s))  Culture, Urine     Status: Abnormal   Collection Time: 08/02/17  4:27 PM  Result Value Ref Range Status   Specimen Description   Final    URINE, RANDOM Performed at [facility], [place] [address] [place], [place] [address]    Special Requests   Final    NONE Performed at [facility], [place] [address] [place], [place] [address]    Culture MULTIPLE SPECIES PRESENT, SUGGEST RECOLLECTION (A)  Final   Report Status 08/04/2017 FINAL  Final     Time coordinating  discharge:  I have spent 35 minutes face to face with the patient and on the ward discussing the patients care, assessment,  plan and disposition with other care givers. >50% of the time was devoted counseling the patient about the risks and benefits of treatment/Discharge disposition and coordinating care.   SIGNED:   Damita Lack, MD  [facility] 08/04/2017, 12:10 PM Pager   If 7PM-7AM, please contact night-coverage [email or web address] Password [email or web address]

## 2017-08-05 ENCOUNTER — Other Ambulatory Visit: Payer: Self-pay | Admitting: Licensed Clinical Social Worker

## 2017-08-05 NOTE — Patient Outreach (Signed)
Assessment:  CSW spoke via phone with [name], daughter of client. CSW verified identity of [name]. CSW received verbal permission from [place] for [place] to speak with [name] about client needs. CSW and [name] spoke of in home care needs of client. [name] said it is difficult for her to care for client as often as needed. She said she lived about 25 miles from client home.  Client receives Social Security benefit monthly but client's Social Security income is about  $ 350.00 over the allowed amount of income to qualify for Medicaid. CSW discussed this information with [name]. CSW also talked with [name] about [facility] in [place], [place] and in home care support through that agency (private pay per hour). CSW talked with [name] about [facility] on Aging support services (for hire).  CSW gave [name] phone number for [facility] CSW. CSW encouraged [name] to call CSW as needed to discuss social work needs of client.   Plan:  [name] to speak with client and Teresa's sister about in home care options for client.  CSW to call client or [name] as scheduled to assess client needs.  [name].Eben Choinski MSW, LCSW Licensed Clinical Social Worker [facility] Care Management [phone]

## 2017-08-06 ENCOUNTER — Other Ambulatory Visit: Payer: Self-pay | Admitting: *Deleted

## 2017-08-06 NOTE — Patient Outreach (Addendum)
Telephone call to pt for transition of care week 1, (pt hospitalized 6/29-08/04/17 for weakness, AKI) spoke with pt, HIPAA verified, pt states it would be better to talk with her daughter [name], pt is home alone and states she cannot remember details such as CBG, appointments, etc.   Telephone call to [place] at [phone], no answer to telephone, left voicemail requesting return phone call.  RN CM mailed letter to patient's home requesting return phone call (for daughter). Late entry- Patient's daughter [name] did call back and left voicemail,  RN CM called [name] back and no answer to telephone, left voicemail requesting return phone call.  PLAN Outreach pt in 3-4 business days  Jacqlyn Larsen [facility], [place] [phone]

## 2017-08-08 ENCOUNTER — Other Ambulatory Visit: Payer: Self-pay

## 2017-08-08 NOTE — Patient Outreach (Signed)
[facility]) Care Management  08/08/2017  Melissa Dalton 1934-09-20 [id number]   83 year old female outreached by [place] services for 30 day post discharge medication review.  PMHx includes, but not limited to, hypertension, GERD, acute kidney injury, gout, Type 2 diabetes mellitus, and hyperlipidemia.   Successful outreach attempt to [name]'s daughter, Melissa Dalton.  HIPAA identifiers verified.   [name] request that I call her back on Monday.  She states that she is going to a doctor's appointment soon.  Plan: Outreach attempt on Monday morning.   [name], PharmD Clinical Pharmacist [place] [phone]

## 2017-08-11 ENCOUNTER — Other Ambulatory Visit: Payer: Self-pay | Admitting: *Deleted

## 2017-08-11 ENCOUNTER — Other Ambulatory Visit: Payer: Self-pay

## 2017-08-11 ENCOUNTER — Ambulatory Visit: Payer: Self-pay

## 2017-08-11 NOTE — Patient Outreach (Signed)
[facility]) Care Management  [place]   08/11/2017  Melissa Dalton [birth year] [id number]  83 year old female outreached by [place] services for 30 day post discharge medication review.  PMHx includes, but not limited to, hypertension, GERD, acute kidney injury, gout, Type 2 diabetes mellitus, and hyperlipidemia.  Successful outreach attempt to Melissa Dalton.  HIPAA identifiers verified.  Subjective: Melissa Dalton reports that she thinks her mother has "given up" since her dad passed about 2 months ago.  She states that her mom is not wanting to eat, did not take her medications last week and is refusing physical therapy.  She reports that she feels her mom has some dementia and states that she has reported "seeing things and saying awkward stuff."  Daughter states that she would like her mother to go to a nursing home where "they could take care of her."  She states this is "too much for me."   Melissa Dalton states that her sister fills the patient's pill box every Sunday.  She states that her mother is checking her CBGs and she states they are "high (197 and 180 something)"  Daughter states her mom's glipizide is "on hold" until her next doctor's appointment because she had a hypoglycemic episode about a month ago, when her glucose was 42.  Her mom reported that she couldn't walk, so they called 911 and she was subsequently found to by hypoglycemic.   Melissa Dalton reports that she did not think that her mom's issues that day were from low glucose, but she is now more aware of signs, symptoms and management of hypoglycemia.  Objective:  SCr 0.83mg /dL on 08/04/17 HgA1c 6% on 08/03/17  Current Medications: Current Outpatient Medications  Medication Sig Dispense Refill  . acetaminophen (TYLENOL) 500 MG tablet Take 500 mg by mouth every 6 (six) hours as needed (for pain or headaches).    Marland Kitchen aspirin 325 MG tablet Take 325 mg by mouth daily.      . Cholecalciferol (VITAMIN D-3) 1000 units CAPS Take 1,000 Units by mouth daily.    . DULoxetine (CYMBALTA) 20 MG capsule Take 2 capsules (40 mg total) by mouth daily. 30 capsule 1  . fexofenadine (ALLEGRA) 180 MG tablet Take 180 mg by mouth daily.     Marland Kitchen gabapentin (NEURONTIN) 300 MG capsule Take 300 mg by mouth every morning.     Marland Kitchen losartan (COZAAR) 50 MG tablet Take 50 mg by mouth daily.    . metFORMIN (GLUCOPHAGE) 1000 MG tablet Take 1,000 mg by mouth 2 (two) times daily.    . Multiple Vitamins-Minerals (MULTIVITAMIN WITH MINERALS) tablet Take 1 tablet by mouth daily.    . potassium chloride SA (KLOR-CON M20) 20 MEQ tablet Take 20 mEq by mouth 2 (two) times daily.     . pravastatin (PRAVACHOL) 40 MG tablet Take 40 mg by mouth every evening.     . Probiotic CAPS Take 1 capsule by mouth daily.    . ranitidine (ZANTAC) 300 MG tablet Take 300 mg by mouth daily.    Marland Kitchen glipiZIDE (GLUCOTROL) 10 MG tablet Take 10 mg by mouth 2 (two) times daily before a meal.     . Pumpkin Seed-Soy Germ (AZO BLADDER CONTROL/GO-LESS) CAPS Take 2 capsules by mouth at bedtime as needed (when bladder issues arise).      No current facility-administered medications for this visit.     Functional Status: In your present state of health, do you have  any difficulty performing the following activities: 08/02/2017 06/25/2017  Hearing? N N  Vision? N N  Difficulty concentrating or making decisions? N N  Walking or climbing stairs? Y Y  Dressing or bathing? N Y  Doing errands, shopping? Tempie Donning  Preparing Food and eating ? - Y  Using the Toilet? - N  In the past six months, have you accidently leaked urine? - N  Do you have problems with loss of bowel control? - N  Managing your Medications? - Y  Managing your Finances? - Y  Housekeeping or managing your Housekeeping? - Y  Some recent data might be hidden    Fall/Depression Screening: Fall Risk  06/25/2017 06/02/2017  Falls in the past year? No Yes  Number falls in  past yr: - 1  Injury with Fall? - No  Risk for fall due to : Impaired balance/gait;Impaired mobility History of fall(s);Medication side effect  Follow up - Falls evaluation completed;Falls prevention discussed   PHQ 2/9 Scores 06/25/2017 06/02/2017  PHQ - 2 Score 2 0  PHQ- 9 Score 6 -   ASSESSMENT: Date Discharged from Hospital: 08/04/17 Date Medication Reconciliation Performed: 08/11/2017  Medications Changes at Discharge:   Increase Duloxetine to 40 mg daily.  Patient was recently discharged from hospital and all medications have been reviewed  Drugs sorted by system:  Neurologic/Psychologic: duloxetine, gabapentin  Cardiovascular: aspirin, losartan, potassium chloride, pravastatin  Pulmonary/Allergy: fexofenadine,  Gastrointestinal: probiotic, ranitidine  Endocrine: glipizide (held), metformin  Pain: acetaminophen  Vitamins/Minerals: cholecalciferol, MVI  Miscellaneous: azo bladder control  Medications to avoid in the elderly:  Like other sulfonylureas, glipizide is associated with hypoglycemia and should be used with caution in the elderly.  Other issues noted:  Given patient's deceased intake, advanced age and last HgA1c of 6%, consider discontinuing glipizide to avoid further hypoglycemic episodes.   Appropriate to have less stringent glycemic goal in Melissa Dalton.   Plan: Route note to PCP, Vicenta Aly, NP.   [name], [place] [phone]

## 2017-08-11 NOTE — Patient Outreach (Signed)
Telephone call to patient's daughter [name] for transition of care week 1/  2nd attempt, phone goes straight to voicemail,  RN CM left voicemail requesting return phone call.  PLAN See pt for scheduled home visit this week.  Jacqlyn Larsen [facility], [place] Coordinator [phone]

## 2017-08-14 ENCOUNTER — Other Ambulatory Visit: Payer: Self-pay

## 2017-08-14 ENCOUNTER — Encounter: Payer: Self-pay | Admitting: *Deleted

## 2017-08-14 ENCOUNTER — Other Ambulatory Visit: Payer: Self-pay | Admitting: *Deleted

## 2017-08-14 NOTE — Patient Outreach (Signed)
[place] [facility]) Care Management   08/14/2017  Kemah Dec 06, 1934 [id number]  Melissa Dalton is an 83 y.o. female  Subjective: Routine home visit with pt, HIPAA verified, pt daughter [name] present and stays with pt several hours daily, has sibling that also assists pt with washing her hair, prefilling med box, states urinary tract infection " cleared up", pt reports she gets nausea at times, appetite good per pt and eating 2 meals daily.  Pt states she is not going to outpatient psychiatrist citing "they think I'm crazy and I'm not"  Pt denies depression although daughter feels pt has given up since pt spouse passed away. Pt denies any hypoglycemic episodes.  Objective:   Vitals:   08/14/17 1152  BP: 126/64  Pulse: 76  Resp: 18  SpO2: 98%  CBG 155 ROS  Physical Exam  Encounter Medications:   Outpatient Encounter Medications as of 08/14/2017  Medication Sig Note  . acetaminophen (TYLENOL) 500 MG tablet Take 500 mg by mouth every 6 (six) hours as needed (for pain or headaches).   Marland Kitchen aspirin 325 MG tablet Take 325 mg by mouth daily.  06/17/2017: ECOTRIN  . Cholecalciferol (VITAMIN D-3) 1000 units CAPS Take 1,000 Units by mouth daily.   . DULoxetine (CYMBALTA) 20 MG capsule Take 2 capsules (40 mg total) by mouth daily.   . fexofenadine (ALLEGRA) 180 MG tablet Take 180 mg by mouth daily.    Marland Kitchen gabapentin (NEURONTIN) 300 MG capsule Take 300 mg by mouth every morning.    Marland Kitchen losartan (COZAAR) 50 MG tablet Take 50 mg by mouth daily.   . metFORMIN (GLUCOPHAGE) 1000 MG tablet Take 1,000 mg by mouth 2 (two) times daily.   . Multiple Vitamins-Minerals (MULTIVITAMIN WITH MINERALS) tablet Take 1 tablet by mouth daily.   . potassium chloride SA (KLOR-CON M20) 20 MEQ tablet Take 20 mEq by mouth 2 (two) times daily.    . pravastatin (PRAVACHOL) 40 MG tablet Take 40 mg by mouth every evening.    . Probiotic CAPS Take 1 capsule by mouth daily.   . ranitidine (ZANTAC) 300 MG  tablet Take 300 mg by mouth daily.   Marland Kitchen glipiZIDE (GLUCOTROL) 10 MG tablet Take 10 mg by mouth 2 (two) times daily before a meal.  08/02/2017: On Hold.  . Pumpkin Seed-Soy Germ (AZO BLADDER CONTROL/GO-LESS) CAPS Take 2 capsules by mouth at bedtime as needed (when bladder issues arise).     No facility-administered encounter medications on file as of 08/14/2017.     Functional Status:   In your present state of health, do you have any difficulty performing the following activities: 08/02/2017 06/25/2017  Hearing? N N  Vision? N N  Difficulty concentrating or making decisions? N N  Walking or climbing stairs? Y Y  Dressing or bathing? N Y  Doing errands, shopping? Tempie Donning  Preparing Food and eating ? - Y  Using the Toilet? - N  In the past six months, have you accidently leaked urine? - N  Do you have problems with loss of bowel control? - N  Managing your Medications? - Y  Managing your Finances? - Y  Housekeeping or managing your Housekeeping? - Y  Some recent data might be hidden    Fall/Depression Screening:    Fall Risk  08/14/2017 06/25/2017 06/02/2017  Falls in the past year? Yes No Yes  Number falls in past yr: 1 - 1  Injury with Fall? No - No  Risk for fall due  to : History of fall(s);Medication side effect Impaired balance/gait;Impaired mobility History of fall(s);Medication side effect  Follow up Falls evaluation completed;Falls prevention discussed - Falls evaluation completed;Falls prevention discussed   PHQ 2/9 Scores 06/25/2017 06/02/2017  PHQ - 2 Score 2 0  PHQ- 9 Score 6 -    Assessment:  Pt stays alone in the evening and throughout the night and states she likes it this way, has no intention of any other level of care. Pt to see primary care MD 08/29/16, Rn CM reminded pt to have bloodwork done as stated on discharge summary. RN CM reviewed safety precautions, keeping pathways clear, use walker.  Pt feels CBG is under better control and does not plan to change her diet.  THN  CM Care Plan Problem One     Most Recent Value  Care Plan Problem One  Knowledge deficit related to pneumonia  Role Documenting the Problem One  Care Management Coordinator  Care Plan for Problem One  Active  THN Long Term Goal   Pt will verbalize and demonstrate improved self care for prevention of pneumonia and other respiratory illnesses within 60 days  THN Long Term Goal Start Date  05/23/17  Interventions for Problem One Long Term Goal  RN CM reviewed medications with pt and daughter, now using prefilled med box, pt does not have hired caregiver, her daughter stays several hours daily,, pt adamantly refuses another level of care, refused home health services  THN CM Short Term Goal #1   Pt will verbalize ways to prevent respiratory infections within 30 days  THN CM Short Term Goal #1 Start Date  05/23/17  [facility] CM Short Term Goal #1 Met Date  06/23/17  THN CM Short Term Goal #2   Pt will take all medications as prescribed within 30 days  THN CM Short Term Goal #2 Start Date  06/02/17  [facility] CM Short Term Goal #2 Met Date  06/23/17    [facility] CM Care Plan Problem Two     Most Recent Value  Care Plan Problem Two  Pt high risk for falls  Role Documenting the Problem Two  Care Management Coordinator  Care Plan for Problem Two  Active  THN CM Short Term Goal #1   Pt will work with home health PT to increase endurance, stamina within 30 days  THN CM Short Term Goal #1 Start Date  06/02/17  [facility] CM Short Term Goal #1 Met Date   07/16/17    [facility] CM Care Plan Problem Three     Most Recent Value  Care Plan Problem Three  Pt needs more oversight by family and privately paid caregivers.  Role Documenting the Problem Three  Care Management Coordinator  Care Plan for Problem Three  Active  THN CM Short Term Goal #1   Family will hire caregiver to asssit with the care of their mother for times that they are not able to cover themselves within the next 30 days.  THN CM Short Term Goal #1 Met Date  07/16/17       Plan: continue weekly transition of care calls  Jacqlyn Larsen [facility], [place] Coordinator [phone]

## 2017-08-14 NOTE — Patient Outreach (Signed)
[facility]) Care Management  08/14/2017  Stryker [birth year] [id number]  83year old female [employer] services for 30 day post discharge medication review.PMHx includes, but not limited to, hypertension, GERD, acute kidney injury, gout, Type 2 diabetes mellitus, and hyperlipidemia.  Post discharge medication review completed.  Will close patient case.  [name], PharmD Clinical Pharmacist [place] [phone]

## 2017-08-19 ENCOUNTER — Other Ambulatory Visit: Payer: Self-pay | Admitting: Licensed Clinical Social Worker

## 2017-08-19 NOTE — Patient Outreach (Signed)
Assessment:  CSW spoke via phone with Greig Castilla . CSW verified identity of Cristine Daw. CSW received verbal permission from Leighann for [place] to speak with Britiany about client needs. Xela  and CSW have spoken of in home care needs of client . Client does receive too much monthly income to qualify for Medicaid. CSW spoke with Kamil about client care plan. CSW encouraged that client communicate with CSW in next 30 days to discuss grief issues of client and grief symptoms experienced by client over the recent death of spouse of client. While recently hospitalized, client had a psychiatry consult. Her Cymbalta dosage amount was increased. Client recently told RN Jacqlyn Larsen that client refused to go to Outpatient Psychiatry services. Client also has been refusing her physical therapy sessions as well.  CSW talked with client about managing grief issues related to recent death of her spouse.  CSW talked with client about  relaxation techniques of choice for client. She said she likes watching favorite TV shows to help her relax. She said she likes sitting on her porch and looking at nature as a way to relax.  CSW encouraged client to continue to use relaxation techniques of choice to help her manage grief symptoms experienced. CSW talked with client about caring for herself. CSW talked with her about eating well and getting enough sleep. CSW informed client of [facility] program support in nursing, social work and pharmacy. CSW thanked client for phone call with CSW. CSW encouraged that client or her daughter please call CSW as needed to discuss social work needs of client.   Plan:  Client to talk with CSW in next 30 days to discuss grief issues of client and grief symptoms experienced by client over the recent death of spouse of client.    CSW to call client or [name] in 4 weeks to assess client needs at that time.  [name].Jolette Lana MSW, LCSW Licensed Clinical Social Worker [facility] Care  Management [phone]

## 2017-08-21 ENCOUNTER — Other Ambulatory Visit: Payer: Self-pay | Admitting: *Deleted

## 2017-08-21 NOTE — Patient Outreach (Addendum)
Telephone call to patient's daughter [name] for transition of care week 2, no answer to telephone [phone], left voicemail requesting return phone call.  Telephone call to patient, spoke with pt, HIPAA verified, pt reports her daughter is not at her house yet and she will let her know RN CM called. Pt is able to only answer certain questions and RN CM unsure if answers are correct/ reliable, per pt CBG yesterday 155, has all medications, denies nausea today, states " I have it on occasion".  Pt denies any falls.  Unable to complete care planning with pt as she would rather RN CM speak with daughter.  PLAN Await call back from daughter Continue weekly transition of care  Jacqlyn Larsen [facility], [place] Coordinator [phone]

## 2017-08-22 DIAGNOSIS — M109 Gout, unspecified: Secondary | ICD-10-CM | POA: Diagnosis not present

## 2017-08-22 DIAGNOSIS — R278 Other lack of coordination: Secondary | ICD-10-CM | POA: Diagnosis not present

## 2017-08-22 DIAGNOSIS — M1990 Unspecified osteoarthritis, unspecified site: Secondary | ICD-10-CM | POA: Diagnosis not present

## 2017-08-22 DIAGNOSIS — M6281 Muscle weakness (generalized): Secondary | ICD-10-CM | POA: Diagnosis not present

## 2017-08-22 DIAGNOSIS — Z9181 History of falling: Secondary | ICD-10-CM | POA: Diagnosis not present

## 2017-08-28 ENCOUNTER — Other Ambulatory Visit: Payer: Self-pay | Admitting: *Deleted

## 2017-08-28 NOTE — Patient Outreach (Signed)
Telephone call to pt for transition of care week 3, spoke with pt, HIPAA verified, pt reports she is home alone, her daughter Melissa Dalton is not there but will be checking in on pt later.  Pt reports she has not checked CBG in 2 days and " just woke up"  Pt states she is to see primary MD 08/29/17, continues to have nausea at times and will discuss with MD, reports taking medications as prescribed, no falls.  Pt prefers RN CM speak with daughter Melissa Kelp, RN CM unable to reach [name] with no answer to phone and no option to leave voicemail.     THN CM Care Plan Problem One     Most Recent Value  Care Plan Problem One  Knowledge deficit related to pneumonia  Role Documenting the Problem One  Care Management Coordinator  Care Plan for Problem One  Active  THN Long Term Goal   Pt will verbalize and demonstrate improved self care for prevention of pneumonia and other respiratory illnesses within 60 days  THN Long Term Goal Start Date  05/23/17  Interventions for Problem One Long Term Goal  RN CM reviewed signs/ symptoms infection  THN CM Short Term Goal #1   Pt will verbalize ways to prevent respiratory infections within 30 days  THN CM Short Term Goal #1 Start Date  05/23/17  [facility] CM Short Term Goal #1 Met Date  06/23/17  THN CM Short Term Goal #2   Pt will take all medications as prescribed within 30 days  THN CM Short Term Goal #2 Start Date  06/02/17  [facility] CM Short Term Goal #2 Met Date  06/23/17    [facility] CM Care Plan Problem Two     Most Recent Value  Care Plan Problem Two  Pt high risk for falls  Role Documenting the Problem Two  Care Management Coordinator  Care Plan for Problem Two  Active  THN CM Short Term Goal #1   Pt will work with home health PT to increase endurance, stamina within 30 days  THN CM Short Term Goal #1 Start Date  06/02/17  [facility] CM Short Term Goal #1 Met Date   07/16/17    [facility] CM Care Plan Problem Three     Most Recent Value  Care Plan Problem Three  Pt needs more oversight by  family and privately paid caregivers.  Role Documenting the Problem Three  Care Management Coordinator  Care Plan for Problem Three  Active  THN CM Short Term Goal #1   Family will hire caregiver to asssit with the care of their mother for times that they are not able to cover themselves within the next 30 days.  THN CM Short Term Goal #1 Met Date  07/16/17     PLAN Continue weekly transition of care calls  Melissa Dalton [facility], [place] Coordinator [phone]

## 2017-08-29 DIAGNOSIS — R54 Age-related physical debility: Secondary | ICD-10-CM | POA: Diagnosis not present

## 2017-08-29 DIAGNOSIS — R531 Weakness: Secondary | ICD-10-CM | POA: Diagnosis not present

## 2017-08-29 DIAGNOSIS — K210 Gastro-esophageal reflux disease with esophagitis: Secondary | ICD-10-CM | POA: Diagnosis not present

## 2017-08-29 DIAGNOSIS — N179 Acute kidney failure, unspecified: Secondary | ICD-10-CM | POA: Diagnosis not present

## 2017-08-29 DIAGNOSIS — K449 Diaphragmatic hernia without obstruction or gangrene: Secondary | ICD-10-CM | POA: Diagnosis not present

## 2017-08-29 DIAGNOSIS — F329 Major depressive disorder, single episode, unspecified: Secondary | ICD-10-CM | POA: Diagnosis not present

## 2017-08-29 DIAGNOSIS — R1110 Vomiting, unspecified: Secondary | ICD-10-CM | POA: Diagnosis not present

## 2017-09-02 DIAGNOSIS — E875 Hyperkalemia: Secondary | ICD-10-CM | POA: Diagnosis not present

## 2017-09-04 ENCOUNTER — Other Ambulatory Visit: Payer: Self-pay | Admitting: *Deleted

## 2017-09-04 NOTE — Patient Outreach (Addendum)
Telephone call to patient's daughter [name] for transition of care week 4, HIPAA verified, [name] reports " things are better, she's doing pretty good, getting around better, getting outside and sitting in the sunshine"  [name] reports pt saw primary care MD 1.5 weeks ago, had bloodwork done.  Reports CBG readings 125, 127 with most readings in low 100's range, no UTI at present, no falls, pt now has hired caregiver that stays during the day and evening, [name] feels this has made a huge difference in her mother citing pt responds better to hired caregiver than to family members and seems to be improving with this care in the home.  Pt continues to have intermittent nausea and MD offered endoscopy and pt refused.  [name] reports pt is set in her ways and there are things pt will not do and cannot be made to do.  RN CM discussed discharge plan and will close case today, [name] does not feel pt has any further needs for nursing, CSW continues to assist pt,  Rn CM sent in basket to [place] and informed of discharge, faxed MD case closure letter for nursing services only.  THN CM Care Plan Problem One     Most Recent Value  Care Plan Problem One  Knowledge deficit related to pneumonia  Role Documenting the Problem One  Care Management Coordinator  Care Plan for Problem One  Active  THN Long Term Goal   Pt will verbalize and demonstrate improved self care for prevention of pneumonia and other respiratory illnesses within 60 days  THN Long Term Goal Start Date  05/23/17  [facility] Long Term Goal Met Date  09/04/17  Interventions for Problem One Long Term Goal  RN CM reminded pt daughter of 24 hour nurse line and other resources to call for assistance, daughter reports pt has all medications and taking as prescribed.  THN CM Short Term Goal #1   Pt will verbalize ways to prevent respiratory infections within 30 days  THN CM Short Term Goal #1 Start Date  05/23/17  [facility] CM Short Term Goal #1 Met Date   06/23/17  THN CM Short Term Goal #2   Pt will take all medications as prescribed within 30 days  THN CM Short Term Goal #2 Start Date  06/02/17  [facility] CM Short Term Goal #2 Met Date  06/23/17    [facility] CM Care Plan Problem Two     Most Recent Value  Care Plan Problem Two  Pt high risk for falls  Role Documenting the Problem Two  Care Management Coordinator  Care Plan for Problem Two  Active  THN CM Short Term Goal #1   Pt will work with home health PT to increase endurance, stamina within 30 days  THN CM Short Term Goal #1 Start Date  06/02/17  [facility] CM Short Term Goal #1 Met Date   07/16/17    [facility] CM Care Plan Problem Three     Most Recent Value  Care Plan Problem Three  Pt needs more oversight by family and privately paid caregivers.  Role Documenting the Problem Three  Care Management Coordinator  Care Plan for Problem Three  Active  THN CM Short Term Goal #1   Family will hire caregiver to asssit with the care of their mother for times that they are not able to cover themselves within the next 30 days.  THN CM Short Term Goal #1 Met Date  07/16/17     PLAN  Close case for nursing CSW continues  Jacqlyn Larsen [place], [place] Coordinator [phone]

## 2017-09-22 ENCOUNTER — Other Ambulatory Visit: Payer: Self-pay | Admitting: Licensed Clinical Social Worker

## 2017-09-22 DIAGNOSIS — R278 Other lack of coordination: Secondary | ICD-10-CM | POA: Diagnosis not present

## 2017-09-22 DIAGNOSIS — M6281 Muscle weakness (generalized): Secondary | ICD-10-CM | POA: Diagnosis not present

## 2017-09-22 DIAGNOSIS — Z9181 History of falling: Secondary | ICD-10-CM | POA: Diagnosis not present

## 2017-09-22 DIAGNOSIS — M109 Gout, unspecified: Secondary | ICD-10-CM | POA: Diagnosis not present

## 2017-09-22 DIAGNOSIS — M1990 Unspecified osteoarthritis, unspecified site: Secondary | ICD-10-CM | POA: Diagnosis not present

## 2017-09-22 NOTE — Patient Outreach (Signed)
Assessment:  CSW spoke via phone with client on 09/22/17.   CSW verified identity of client. CSW received verbal permission from client for CSW to speak with client about client needs. CSW encouraged that client or [name] communicate with CSW in next 30 days to discuss in home care options for client. CSW reminded client about  Aging, Disability, and Transient Services in [place], [place].  [place] reminded client about [facility] on Aging support services for hire.   Client earns too much monthly income to qualify for Medicaid. Regarding client care plan, CSW encouraged client to talk with CSW in next 30 days to discuss grief issus of client  and grief symptoms experienced by client over the recent death of spouse of client.  CSW talked with [place] about use of relaxation techniques of choice to help client with grief management. Client said she likes watching favorite TV shows or sitting on her porch and looking at birds and nature to help her relax. She said she likes talking via phone with family. She enjoys visits by her daughters to client home.  CSW encouraged client to use relaxation techniques to help her manage grief symptoms experienced. CSW reminded client of [facility] program support in nursing, social work and pharmacy.  CSW thanked client for phone call with CSW on 09/22/17.  Client was appreciative of phone call from [place] on 09/22/17.  Plan:  Client to talk with CSW in next 30 days to discuss grief issues of client and grief symptoms experienced by client over the recent death of spouse of client.     CSW to call client or [name] in 4 weeks to assess client needs.   [name].Elisa Sorlie MSW, LCSW Licensed Clinical Social Worker [facility] Care Management [phone]

## 2017-10-23 ENCOUNTER — Other Ambulatory Visit: Payer: Self-pay | Admitting: Licensed Clinical Social Worker

## 2017-10-23 DIAGNOSIS — M1990 Unspecified osteoarthritis, unspecified site: Secondary | ICD-10-CM | POA: Diagnosis not present

## 2017-10-23 DIAGNOSIS — R278 Other lack of coordination: Secondary | ICD-10-CM | POA: Diagnosis not present

## 2017-10-23 DIAGNOSIS — M6281 Muscle weakness (generalized): Secondary | ICD-10-CM | POA: Diagnosis not present

## 2017-10-23 DIAGNOSIS — M109 Gout, unspecified: Secondary | ICD-10-CM | POA: Diagnosis not present

## 2017-10-23 DIAGNOSIS — Z9181 History of falling: Secondary | ICD-10-CM | POA: Diagnosis not present

## 2017-10-23 NOTE — Patient Outreach (Signed)
Assessment:  CSW spoke via phone with clent . CSW verified client identity. CSW received verbal permission from client for CSW to speak with client about client needs. CSW and client spoke of client care plan. CSW encouraged client to talk with CSW in the next 30 days to discuss grief issues of client and grief symptoms experienced by client over the recent death of spouse of client. CSW talked with Melissa Dalton about her using relaxation techniques of choice to help client with grief management.  Client said she is continuing to watch favorite TV shows and continues to enjoy sittig on her porch and looking at nature and birds to help her relax.  CSW encouraged client to use relaxation techniques of choice to help her manage stress or grief issues. She said she also likes talking via phone with family and friends to manage stress.  She said her daughters are supportive and she enjoys visits at her home with her daughters. Client said she sometimes enjoys listening to music to help her relax. She said she is eating adequately and sleeping adequately.  She said she has an appointment with her medica provider, Marvel Plan, Family Nurse Practitioner, in October of 2019.  CSW reminded client of [facility] program support in areas of nursing, social work and pharmacy. CSW thanked client for phone call with CSW on 10/23/17. CSW encouraged Melissa Dalton or her daughter, Melissa Dalton, to call CSW at [phone] as needed to discuss social work needs of client.    Plan:  Client to talk with CSW in the next 30 days to discuss grief issues  of client and grief symptoms experienced by client over the recent death of spouse of client  .  CSW to call client or Melissa Dalton in 4 weeks to assess client needs at that time.  Melissa Dalton.Melissa Dalton MSW, LCSW Licensed Clinical Social Worker [facility] Care Management [phone]

## 2017-10-31 ENCOUNTER — Other Ambulatory Visit: Payer: Self-pay | Admitting: Pharmacist

## 2017-10-31 NOTE — Patient Outreach (Signed)
[place] [facility]) Care Management  10/31/2017  Melissa Dalton [facility] [birth year] [id number]   Outreach call to Melissa Dalton regarding her request for follow up from the [facility] Medication Adherence Campaign. Speak with patient. HIPAA identifiers verified and verbal consent received.   [name] reports that she takes her metformin, but is not sure how she takes it, as reports that her daughter Melissa Dalton manages her medications and pillbox for her. Counsel patient about the benefits of blood sugar control and medication adherence. States that she checks her blood sugar at home, but cannot remember what it last was. Denies any recent low blood sugars. Denies any medication questions/concerns. Patient gives permission for me to speak with her daughter, Melissa Dalton, about her medications.    Call to follow up with [facility]. Leave a HIPAA compliant message on her voicemail and she calls right back. Melissa Dalton reports that she fills her mom's pillbox for her weekly. Reports that [name] takes metformin 1000 mg twice daily, but that her glipizide was discontinued by the patient's PCP around July because of episodes of hypoglycemia. Reports that her mom misses a dose of metformin maybe twice a week. Reports that she currently has a caregiver who comes each morning, Monday -Friday for a couple of hours and makes sure that the patient has her medication. Reports that she calls the patient most evenings and that both she and her sister visit their mom during the weekend. Reports that the patient's fasting blood sugars have been running between 100-150 mg/dL.  Melissa Dalton denies any medication questions/concerns on behalf of the patient. Confirms that she has my phone number.  Will close pharmacy episode at this time.  Harlow Asa, PharmD, [place] Management [phone]

## 2017-11-22 DIAGNOSIS — M109 Gout, unspecified: Secondary | ICD-10-CM | POA: Diagnosis not present

## 2017-11-22 DIAGNOSIS — M1990 Unspecified osteoarthritis, unspecified site: Secondary | ICD-10-CM | POA: Diagnosis not present

## 2017-11-22 DIAGNOSIS — R278 Other lack of coordination: Secondary | ICD-10-CM | POA: Diagnosis not present

## 2017-11-22 DIAGNOSIS — M6281 Muscle weakness (generalized): Secondary | ICD-10-CM | POA: Diagnosis not present

## 2017-11-22 DIAGNOSIS — Z9181 History of falling: Secondary | ICD-10-CM | POA: Diagnosis not present

## 2017-11-24 ENCOUNTER — Other Ambulatory Visit: Payer: Self-pay | Admitting: Licensed Clinical Social Worker

## 2017-11-24 NOTE — Patient Outreach (Signed)
Assessment:  CSW spoke via phone with client. CSW verified client identity. CSW received verbal permission from client for CSW to speak with client about client needs. CSW spoke with clent about client care plan. CSW encouraged Melissa Dalton to talk with CSW in the next 30 days about grief issues of client and about grief symptoms experienced by client over the recent death of spouse of client. CSW talked with Melissa Dalton about her use of relaxation techniques to help her manage depression and grief symptoms. CSW encouraged client to watch favorite TV shows or movie to help her relax. CSW encouraged client to talk via phone with family or friends to help her manage grief or depression symptoms. CSW encouraged client to continue to spend time on porch relaxing and enjoying nature scenes as a way to manage symptoms faced. Client also said she likes receiving visits at her home from family members and friends as a way to manage grief or depression symptoms. CSW additionally encouraged client to listen to music of choice as a way to help her relax. Client said she likes listening to [employer]. Client has support from her daughters. Client said she had her prescribed medications and is taking medications as prescribed. Client sees Melissa Dalton, Family Nurse Practitioner, as medical provider. Client said she is scheduled for an appointment with Melissa Dalton for 12/02/17.  Client has caregiver who assists client for a few hours daily in the home. Client said that the support from her caregiver has been very helpful . CSW provided counseling support for client. CSW thanked client for phone call with CSW . CSW encouraged client or her daughters to call CSW as needed to discuss social work needs of client. .   Plan:  Client to talk with CSW in the next 30 days to discuss grief issues of client and grief symptoms experienced by client over the recent death of spouse of client.   CSW to call client or Melissa Dalton in 4  weeks to assess client needs at that time.   Melissa Dalton.Melissa Dalton MSW, LCSW Licensed Clinical Social Worker [facility] Care Management [phone]

## 2017-12-02 DIAGNOSIS — F4321 Adjustment disorder with depressed mood: Secondary | ICD-10-CM | POA: Diagnosis not present

## 2017-12-02 DIAGNOSIS — M150 Primary generalized (osteo)arthritis: Secondary | ICD-10-CM | POA: Diagnosis not present

## 2017-12-02 DIAGNOSIS — L98421 Non-pressure chronic ulcer of back limited to breakdown of skin: Secondary | ICD-10-CM | POA: Diagnosis not present

## 2017-12-02 DIAGNOSIS — R54 Age-related physical debility: Secondary | ICD-10-CM | POA: Diagnosis not present

## 2017-12-02 DIAGNOSIS — M5416 Radiculopathy, lumbar region: Secondary | ICD-10-CM | POA: Diagnosis not present

## 2017-12-02 DIAGNOSIS — K449 Diaphragmatic hernia without obstruction or gangrene: Secondary | ICD-10-CM | POA: Diagnosis not present

## 2017-12-02 DIAGNOSIS — I10 Essential (primary) hypertension: Secondary | ICD-10-CM | POA: Diagnosis not present

## 2017-12-02 DIAGNOSIS — E782 Mixed hyperlipidemia: Secondary | ICD-10-CM | POA: Diagnosis not present

## 2017-12-02 DIAGNOSIS — E118 Type 2 diabetes mellitus with unspecified complications: Secondary | ICD-10-CM | POA: Diagnosis not present

## 2017-12-05 DIAGNOSIS — E1140 Type 2 diabetes mellitus with diabetic neuropathy, unspecified: Secondary | ICD-10-CM | POA: Diagnosis not present

## 2017-12-05 DIAGNOSIS — M791 Myalgia, unspecified site: Secondary | ICD-10-CM | POA: Diagnosis not present

## 2017-12-05 DIAGNOSIS — Z7982 Long term (current) use of aspirin: Secondary | ICD-10-CM | POA: Diagnosis not present

## 2017-12-05 DIAGNOSIS — I10 Essential (primary) hypertension: Secondary | ICD-10-CM | POA: Diagnosis not present

## 2017-12-05 DIAGNOSIS — E782 Mixed hyperlipidemia: Secondary | ICD-10-CM | POA: Diagnosis not present

## 2017-12-05 DIAGNOSIS — F4321 Adjustment disorder with depressed mood: Secondary | ICD-10-CM | POA: Diagnosis not present

## 2017-12-05 DIAGNOSIS — Z9181 History of falling: Secondary | ICD-10-CM | POA: Diagnosis not present

## 2017-12-05 DIAGNOSIS — Z7984 Long term (current) use of oral hypoglycemic drugs: Secondary | ICD-10-CM | POA: Diagnosis not present

## 2017-12-05 DIAGNOSIS — M5416 Radiculopathy, lumbar region: Secondary | ICD-10-CM | POA: Diagnosis not present

## 2017-12-05 DIAGNOSIS — L89321 Pressure ulcer of left buttock, stage 1: Secondary | ICD-10-CM | POA: Diagnosis not present

## 2017-12-23 DIAGNOSIS — M1990 Unspecified osteoarthritis, unspecified site: Secondary | ICD-10-CM | POA: Diagnosis not present

## 2017-12-23 DIAGNOSIS — M6281 Muscle weakness (generalized): Secondary | ICD-10-CM | POA: Diagnosis not present

## 2017-12-23 DIAGNOSIS — R278 Other lack of coordination: Secondary | ICD-10-CM | POA: Diagnosis not present

## 2017-12-23 DIAGNOSIS — M109 Gout, unspecified: Secondary | ICD-10-CM | POA: Diagnosis not present

## 2017-12-23 DIAGNOSIS — Z9181 History of falling: Secondary | ICD-10-CM | POA: Diagnosis not present

## 2017-12-24 ENCOUNTER — Other Ambulatory Visit: Payer: Self-pay | Admitting: Licensed Clinical Social Worker

## 2017-12-24 NOTE — Patient Outreach (Signed)
Assessment  CSW spoke via phone with [name], daughter of client. CSW verified identity of [name]. CSW received verbal permission from [place] for [place] to speak with [name] about client needs. CSW spoke with [name] about client care plan. CSW informed [name] that CSW had encouraged [place] to continue to communicate with CSW in the next 30 days about grief issues of client and about grief symptoms  experienced by client over the recent death of spouse of client.  [name] said that client is trying to adjust to the death of her spouse. She said client is eating small meals each day.  She said client had her prescribed medications and is taking medications as prescribed.  She said client uses a walker to help her ambulate. Client also has caregiver who assists her in the home Mondays through  Fridays as scheduled each week.  [name] , daughter of client , helps client on weekends.   Client sees Vicenta Aly, Family Nurse Practiioner, for medical care. Client will have appointment with Vicenta Aly again in April of 2020. Daughter of client is providing transport help for client for client to go to and from client's medical appointments.  Client has home health nursing assistance 2 times weekly as scheduled for skin care issue of client.  CSW talked with [name] about [facility] program support in nursing, social work and pharmacy. CSW encouraged that client or [name] call CSW at [phone] as needed to discuss social work needs of client.  CSW also informed [name] that CSW had talked several times with client about client use of relaxation techniques of choice to help client relax and manage grief symptoms experienced.  [name] said client sits in recliner at home and that Vicenta Aly had sent order for a cushion for client to use in client's chair.  CSW thanked [name] for phone call with CSW on 12/24/17.  [name] was appreciative of phone call form CSW on 12/24/17.     Plan:  Client to talk with CSW in the next 30 days to discuss grief issues of client and grief symptoms  experienced by client over the recent death of spouse of client.   CSW to call client or [name] in 3 weeks to assess client needs at that time.  [name].Chantee Cerino MSW, LCSW Licensed Clinical Social Worker [facility] Care Management [phone]

## 2018-01-09 DIAGNOSIS — F4321 Adjustment disorder with depressed mood: Secondary | ICD-10-CM | POA: Diagnosis not present

## 2018-01-09 DIAGNOSIS — Z7982 Long term (current) use of aspirin: Secondary | ICD-10-CM | POA: Diagnosis not present

## 2018-01-09 DIAGNOSIS — L89321 Pressure ulcer of left buttock, stage 1: Secondary | ICD-10-CM | POA: Diagnosis not present

## 2018-01-09 DIAGNOSIS — Z9181 History of falling: Secondary | ICD-10-CM | POA: Diagnosis not present

## 2018-01-09 DIAGNOSIS — E1140 Type 2 diabetes mellitus with diabetic neuropathy, unspecified: Secondary | ICD-10-CM | POA: Diagnosis not present

## 2018-01-09 DIAGNOSIS — E782 Mixed hyperlipidemia: Secondary | ICD-10-CM | POA: Diagnosis not present

## 2018-01-09 DIAGNOSIS — M5416 Radiculopathy, lumbar region: Secondary | ICD-10-CM | POA: Diagnosis not present

## 2018-01-09 DIAGNOSIS — M791 Myalgia, unspecified site: Secondary | ICD-10-CM | POA: Diagnosis not present

## 2018-01-09 DIAGNOSIS — Z7984 Long term (current) use of oral hypoglycemic drugs: Secondary | ICD-10-CM | POA: Diagnosis not present

## 2018-01-09 DIAGNOSIS — I10 Essential (primary) hypertension: Secondary | ICD-10-CM | POA: Diagnosis not present

## 2018-01-15 ENCOUNTER — Emergency Department (HOSPITAL_COMMUNITY): Payer: PPO

## 2018-01-15 ENCOUNTER — Other Ambulatory Visit: Payer: Self-pay | Admitting: Licensed Clinical Social Worker

## 2018-01-15 ENCOUNTER — Emergency Department (HOSPITAL_COMMUNITY)
Admission: EM | Admit: 2018-01-15 | Discharge: 2018-01-15 | Disposition: A | Discharge status: Home / Self Care | Payer: PPO | Attending: Emergency Medicine | Admitting: Emergency Medicine

## 2018-01-15 DIAGNOSIS — E119 Type 2 diabetes mellitus without complications: Secondary | ICD-10-CM | POA: Diagnosis not present

## 2018-01-15 DIAGNOSIS — S79912A Unspecified injury of left hip, initial encounter: Secondary | ICD-10-CM | POA: Diagnosis not present

## 2018-01-15 DIAGNOSIS — G8929 Other chronic pain: Secondary | ICD-10-CM | POA: Diagnosis not present

## 2018-01-15 DIAGNOSIS — Z7984 Long term (current) use of oral hypoglycemic drugs: Secondary | ICD-10-CM | POA: Diagnosis not present

## 2018-01-15 DIAGNOSIS — M25552 Pain in left hip: Secondary | ICD-10-CM | POA: Diagnosis not present

## 2018-01-15 DIAGNOSIS — I10 Essential (primary) hypertension: Secondary | ICD-10-CM | POA: Diagnosis not present

## 2018-01-15 DIAGNOSIS — W19XXXA Unspecified fall, initial encounter: Secondary | ICD-10-CM | POA: Diagnosis not present

## 2018-01-15 DIAGNOSIS — R0902 Hypoxemia: Secondary | ICD-10-CM | POA: Diagnosis not present

## 2018-01-15 DIAGNOSIS — Z7982 Long term (current) use of aspirin: Secondary | ICD-10-CM | POA: Insufficient documentation

## 2018-01-15 DIAGNOSIS — Z79899 Other long term (current) drug therapy: Secondary | ICD-10-CM | POA: Insufficient documentation

## 2018-01-15 LAB — URINALYSIS, ROUTINE W REFLEX MICROSCOPIC
Bilirubin Urine: NEGATIVE
Glucose, UA: NEGATIVE mg/dL
Hgb urine dipstick: NEGATIVE
Ketones, ur: NEGATIVE mg/dL
Leukocytes, UA: NEGATIVE
NITRITE: NEGATIVE
Protein, ur: NEGATIVE mg/dL
Specific Gravity, Urine: 1.013 (ref 1.005–1.030)
pH: 7 (ref 5.0–8.0)

## 2018-01-15 MED ORDER — HYDROCODONE-ACETAMINOPHEN 5-325 MG PO TABS: 1.0000 | ORAL_TABLET | Freq: Four times a day (QID) | ORAL | 0 refills | Status: AC | PRN

## 2018-01-15 MED ORDER — HYDROCODONE-ACETAMINOPHEN 5-325 MG PO TABS
1.0000 | ORAL_TABLET | Freq: Once | ORAL | Status: AC
  Administered 2018-01-15: 1 via ORAL
  Filled 2018-01-15: qty 1

## 2018-01-15 NOTE — Patient Outreach (Signed)
Assessment:  Client went to [facility] today related to left hip pain.  She received medical evaluation at that emergency room and discharged back to her home on 01/15/18.  Medical provider had ordered a pain medication for client related to client's left hip pain. CSW spoke via phone with client on 01/15/18 and verified client identity. CSW received verbal permission from client for CSW to speak with client about client needs.  CSW spoke with client about client care plan.  CSW had previously encouraged [place] to talk with CSW in the next 30 days about grief issues of client.  Client has been experiencing grief symptoms related to the death of her spouse. CSW has talked with client about client use of relaxation techniques of choice to help client manage grief symptoms experienced by client.  Client has family support. She receives support from her daughters, [name] and Edd Fabian.  Client has home health aide as scheduled weekly.  Client has Meals on Wheels support. Client is eating small meals several times daily.  Client has had some recent falls but has had no serious injury from fall.  Client uses walker to ambulate.  Client likes sitting on porch to help her relax.  Client watches TV and likes word search books. Client likes to read to help her relax.  Client has prescribed medications and is taking medications as prescribed. Client sees Vicenta Aly, Family Nurse Practitioner, as medical provider. CSW has encouraged client or her daughters to call CSW as needed to discuss social work needs of client.  Plan:  Client to talk with CSW in the next 30 days to discuss grief issues of client and grief symptoms experienced by client over the recent death of spouse of client.   CSW to call client or [name] in 4 weeks to assess client needs at that time.  [name].Jigar Zielke MSW, LCSW Licensed Clinical Social Worker [facility] Care Management [phone]

## 2018-01-15 NOTE — ED Provider Notes (Signed)
[facility] DEPT Provider Note   CSN: [id number] Arrival date & time: 01/15/18  1134     History   Chief Complaint Chief Complaint  Patient presents with  . Hip Pain    HPI Melissa Dalton is a 83 y.o. female.  The history is provided by the patient and medical records. No language interpreter was used.  Hip Pain      83 year old female presenting for evaluation of recurrent left hip pain.  Patient states she may have fallen sometimes in the summertime and injured her left hip.  States that she was initially evaluated for hip pain but no imaging was performed and no specific treatment was given.  Since then she has had recurrent pain primarily to the left hip.  Pain is described as a sharp sensation nonradiating sometimes worsening with movement.  2 weeks ago her legs gave out on her and she fell down without hitting her head or loss of consciousness.  It seems to aggravate her left hip a bit more.  She has had recurrent pain to the same location since.  She tries taking Tylenol on occasion at home without relief.  No report of any significant headache, lightheadedness, dizziness, pain in her chest, trouble breathing, abdominal pain, dysuria, focal numbness or weakness.  She has tried tramadol, as well as Neurontin in the past but states it did not provide her relief.  She is here today because "it just got to hurting of my left hip and I need something done about it"  Past Medical History:  Diagnosis Date  . Anxiety   . Arthritis   . Complication of anesthesia    DIFFICULT TO WAKE AFTER HERNIA OPERATION  . Depression   . Diabetes ([place])   . High cholesterol   . HTN (hypertension)   . PONV (postoperative nausea and vomiting)   . UTI (urinary tract infection) 06/18/2017    Patient Active Problem List   Diagnosis Date Noted  . MDD (major depressive disorder), recurrent severe, without psychosis ([place])   . Essential hypertension 06/18/2017  .  GERD (gastroesophageal reflux disease) 06/18/2017  . Depression with anxiety 06/18/2017  . Sepsis ([place]) 06/17/2017  . UTI (urinary tract infection) 06/17/2017  . AKI (acute kidney injury) ([place]) 06/17/2017  . Acute metabolic encephalopathy 46/96/2952  . Weakness 04/24/2017  . Diabetes mellitus without complication ([place]) 84/13/2440  . CAP (community acquired pneumonia) 04/24/2017  . HLD (hyperlipidemia) 04/24/2017  . Gout 04/24/2017  . Coronary artery calcification 12/20/2012  . Multiple lung nodules 10/25/2012  . Chronic cough 10/25/2012    Past Surgical History:  Procedure Laterality Date  . HERNIA REPAIR    . TOTAL ABDOMINAL HYSTERECTOMY    . TUMOR REMOVAL       OB History   No obstetric history on file.      Home Medications    Prior to Admission medications   Medication Sig Start Date End Date Taking? Authorizing Provider  acetaminophen (TYLENOL) 500 MG tablet Take 500 mg by mouth every 6 (six) hours as needed (for pain or headaches).    [provider]  aspirin 325 MG tablet Take 325 mg by mouth daily.     [provider]  Cholecalciferol (VITAMIN D-3) 1000 units CAPS Take 1,000 Units by mouth daily.    [provider]  DULoxetine (CYMBALTA) 20 MG capsule Take 2 capsules (40 mg total) by mouth daily. 08/04/17   Amin, Jeanella Flattery, MD  fexofenadine (ALLEGRA) 180 MG  tablet Take 180 mg by mouth daily.     [provider]  gabapentin (NEURONTIN) 300 MG capsule Take 300 mg by mouth every morning.  03/18/17   [provider]  losartan (COZAAR) 50 MG tablet Take 50 mg by mouth daily. 06/17/17   [provider]  metFORMIN (GLUCOPHAGE) 1000 MG tablet Take 1,000 mg by mouth 2 (two) times daily. 02/10/17   [provider]  Multiple Vitamins-Minerals (MULTIVITAMIN WITH MINERALS) tablet Take 1 tablet by mouth daily.    [provider]  potassium chloride SA (KLOR-CON M20) 20 MEQ tablet Take 20 mEq by mouth 2 (two)  times daily.  08/06/12   [provider]  pravastatin (PRAVACHOL) 40 MG tablet Take 40 mg by mouth every evening.  08/18/12   [provider]  Probiotic CAPS Take 1 capsule by mouth daily.    [provider]  Pumpkin Seed-Soy Germ (AZO BLADDER CONTROL/GO-LESS) CAPS Take 2 capsules by mouth at bedtime as needed (when bladder issues arise).     [provider]  ranitidine (ZANTAC) 300 MG tablet Take 300 mg by mouth daily.    [provider]    Family History Family History  Problem Relation Age of Onset  . Heart disease Father   . Diabetes Mother     Social History Social History   Tobacco Use  . Smoking status: Never Smoker  . Smokeless tobacco: Never Used  Substance Use Topics  . Alcohol use: No  . Drug use: No     Allergies   Oxycodone; Penicillins; and Tramadol   Review of Systems Review of Systems  Constitutional: Negative for fever.  Musculoskeletal: Positive for arthralgias.  Skin: Negative for rash and wound.  Neurological: Negative for numbness.     Physical Exam Updated Vital Signs BP (!) 120/96   Pulse (!) 104   Temp 98.1 F (36.7 C)   Resp 18   SpO2 97%   Physical Exam Vitals signs and nursing note reviewed.  Constitutional:      General: She is not in acute distress.    Appearance: She is well-developed.  HENT:     Head: Atraumatic.  Eyes:     Conjunctiva/sclera: Conjunctivae normal.  Neck:     Musculoskeletal: Neck supple.  Cardiovascular:     Rate and Rhythm: Normal rate and regular rhythm.  Pulmonary:     Effort: Pulmonary effort is normal.     Breath sounds: Normal breath sounds.  Abdominal:     Palpations: Abdomen is soft.     Tenderness: There is no abdominal tenderness.  Musculoskeletal:        General: Tenderness (Left hip: Mild tenderness to left groin on palpation and tenderness with hip flexion however hip maintained full range of motion without any crepitus or deformity.  No overlying  skin changes.) present.     Comments: No significant tenderness to lumbar spine or left knee on exam.  Skin:    Findings: No rash.  Neurological:     Mental Status: She is alert.      ED Treatments / Results  Labs (all labs ordered are listed, but only abnormal results are displayed) Labs Reviewed  URINALYSIS, ROUTINE W REFLEX MICROSCOPIC    EKG None  Radiology Dg Hip Unilat W Or Wo Pelvis 2-3 Views Left  Result Date: 01/15/2018 CLINICAL DATA:  LEFT hip pain, fell in July and 2 weeks ago EXAM: DG HIP (WITH OR WITHOUT PELVIS) 2-3V LEFT COMPARISON:  None FINDINGS:  Osseous demineralization. Degenerative changes of the hip joints bilaterally greater on LEFT for bone-on-bone appearance is identified. Minimal flattening of the LEFT femoral head secondary to degenerative changes. Suspected small subchondral cyst at acetabular roof. No acute fracture, dislocation or additional bone destruction. Linear lucency is seen vertically at the intertrochanteric region on the AP view of the LEFT hip, not seen on the AP view of the pelvis or on the oblique view without cortical disruption, favor artifact. IMPRESSION: Osteoarthritic changes of the hip joints bilaterally greater on LEFT. No definite acute bony abnormalities, with suspected artifact at the proximal LEFT femur seen only on a single AP view of the LEFT hip. If patient has persistent symptoms however, consider MR follow-up to exclude radiographically occult fracture. Electronically Signed   By: Lavonia Dana M.D.   On: 01/15/2018 12:52    Procedures Procedures (including critical care time)  Medications Ordered in ED Medications  HYDROcodone-acetaminophen (NORCO/VICODIN) 5-325 MG per tablet 1 tablet (has no administration in time range)     Initial Impression / Assessment and Plan / ED Course  I have reviewed the triage vital signs and the nursing notes.  Pertinent labs & imaging results that were available during my care of the patient  were reviewed by me and considered in my medical decision making (see chart for details).     BP (!) 120/96   Pulse (!) 104   Temp 98.1 F (36.7 C)   Resp 18   SpO2 97%    Final Clinical Impressions(s) / ED Diagnoses   Final diagnoses:  Chronic left hip pain    ED Discharge Orders         Ordered    HYDROcodone-acetaminophen (NORCO/VICODIN) 5-325 MG tablet  Every 6 hours PRN     01/15/18 1356         1:20 PM Patient here with acute on chronic left hip pain.  She has multiple falls injuring her left hip in the past.  X-ray of her left hip and pelvis today demonstrate osteoarthritis changes bilaterally greater on the left side Without any acute bony abnormalities.  I discussed this finding with patient.  I encourage patient to follow-up with orthopedist for further evaluation of her recurrent hip pain.  She may benefit from an outpatient MRI of her hip to rule out occult fracture however this does not need to be performed emergently in the ED.  Will provide stronger pain medication for breakthrough pain.  Care discussed with Dr. Stark Jock.    1:50 PM UA unremarkable.  In order to decrease risk of narcotic abuse. Pt's record were checked using the  Controlled Substance database.     Domenic Moras, PA-C 01/15/18 1400    Veryl Speak, MD 01/19/18 [phone]

## 2018-01-15 NOTE — ED Notes (Signed)
Patient transported to X-ray 

## 2018-01-15 NOTE — ED Triage Notes (Signed)
Transported by [facility] from home-- experienced a fall in July and since then has had pain/discomfort in the left hip. Patient stumbled today into her recliner and called 911. (-) LOC, head/neck or back pain, or deformity. Pain currently 0/10, patient took Tylenol prior to arrival.

## 2018-01-15 NOTE — Discharge Instructions (Signed)
Your hip pain is likely due to osteoarthritis.  However call and follow up with orthopedist for further evaluation of your pain.  You may benefit from an MRI to rule out an occult fracture.  Take vicodin with food as needed for breakthrough pain of your hip if tylenol alone isn't adequate.

## 2018-01-15 NOTE — ED Notes (Signed)
Bed: LT53 Expected date:  Expected time:  Means of arrival:  Comments: EMS 83 hip pain

## 2018-01-22 DIAGNOSIS — Z9181 History of falling: Secondary | ICD-10-CM | POA: Diagnosis not present

## 2018-01-22 DIAGNOSIS — M109 Gout, unspecified: Secondary | ICD-10-CM | POA: Diagnosis not present

## 2018-01-22 DIAGNOSIS — M6281 Muscle weakness (generalized): Secondary | ICD-10-CM | POA: Diagnosis not present

## 2018-01-22 DIAGNOSIS — M1990 Unspecified osteoarthritis, unspecified site: Secondary | ICD-10-CM | POA: Diagnosis not present

## 2018-01-22 DIAGNOSIS — R278 Other lack of coordination: Secondary | ICD-10-CM | POA: Diagnosis not present

## 2018-01-29 ENCOUNTER — Other Ambulatory Visit: Payer: Self-pay | Admitting: Licensed Clinical Social Worker

## 2018-01-29 NOTE — Patient Outreach (Signed)
Assessment:  CSW spoke via phone with client. CSW verified client identity. CSW received verbal permission from client for CSW to speak with client about client needs.  CSW has been communicating with client in recent months related to grief issues of client. CSW has provided counseling support for client . Client experienced the death of her spouse several months ago CSW has talked with client about use of relaxation techniques to help client manage grief symptoms faced. Client has family support but she does live alone. Client does receive assistance weekly as scheduled with a home health aide.  Client has Meals on Wheels support. Client likes to relax by sitting on porch and watching nature scenes and birds. Client likes watching favorite TV shows or doing word search puzzles to help her relax. CSW informed client on 01/29/18 that [facility] would transfer CSW care and support for client to [place] on 01/29/18. Client agreed to this plan. Client agreed for Raynaldo Opitz, LCSW to call client in 4 weeks to assess client needs at that time and to further talk with client about grief issues of client. Client has been eating several small meals daily.  She did see a medical provider recently related to hip pain (left hip). She said she does not take a prescribed pain medication. She agreed for Raynaldo Opitz, LCSW, to call her in one month to check on her status.   Plan:  CSW [place] Nan is transferring CSW care and support for client to [place] on 01/29/18. Client has agreed to this plan.  CSW Raynaldo Opitz to call client in 4 weeks to assess client needs at that time and to further talk with client about grief issues of client.  [name].Daunte Oestreich MSW, LCSW Licensed Clinical Social Worker [facility] Care Management [phone]

## 2018-02-05 ENCOUNTER — Encounter (HOSPITAL_COMMUNITY): Payer: Self-pay | Admitting: Obstetrics and Gynecology

## 2018-02-05 ENCOUNTER — Emergency Department (HOSPITAL_COMMUNITY): Payer: PPO

## 2018-02-05 ENCOUNTER — Emergency Department (HOSPITAL_COMMUNITY)
Admission: EM | Admit: 2018-02-05 | Discharge: 2018-02-05 | Disposition: A | Discharge status: Home / Self Care | Payer: PPO | Attending: Emergency Medicine | Admitting: Emergency Medicine

## 2018-02-05 ENCOUNTER — Other Ambulatory Visit: Payer: Self-pay

## 2018-02-05 DIAGNOSIS — Z7984 Long term (current) use of oral hypoglycemic drugs: Secondary | ICD-10-CM | POA: Insufficient documentation

## 2018-02-05 DIAGNOSIS — W19XXXA Unspecified fall, initial encounter: Secondary | ICD-10-CM | POA: Diagnosis not present

## 2018-02-05 DIAGNOSIS — E119 Type 2 diabetes mellitus without complications: Secondary | ICD-10-CM | POA: Insufficient documentation

## 2018-02-05 DIAGNOSIS — R4182 Altered mental status, unspecified: Secondary | ICD-10-CM | POA: Diagnosis not present

## 2018-02-05 DIAGNOSIS — M160 Bilateral primary osteoarthritis of hip: Secondary | ICD-10-CM | POA: Insufficient documentation

## 2018-02-05 DIAGNOSIS — M1612 Unilateral primary osteoarthritis, left hip: Secondary | ICD-10-CM | POA: Diagnosis not present

## 2018-02-05 DIAGNOSIS — Z79899 Other long term (current) drug therapy: Secondary | ICD-10-CM | POA: Insufficient documentation

## 2018-02-05 DIAGNOSIS — I10 Essential (primary) hypertension: Secondary | ICD-10-CM | POA: Diagnosis not present

## 2018-02-05 DIAGNOSIS — S79912A Unspecified injury of left hip, initial encounter: Secondary | ICD-10-CM | POA: Diagnosis not present

## 2018-02-05 DIAGNOSIS — R404 Transient alteration of awareness: Secondary | ICD-10-CM | POA: Diagnosis not present

## 2018-02-05 DIAGNOSIS — R0902 Hypoxemia: Secondary | ICD-10-CM | POA: Diagnosis not present

## 2018-02-05 DIAGNOSIS — R2681 Unsteadiness on feet: Secondary | ICD-10-CM | POA: Diagnosis not present

## 2018-02-05 DIAGNOSIS — M25552 Pain in left hip: Secondary | ICD-10-CM | POA: Diagnosis not present

## 2018-02-05 DIAGNOSIS — S0990XA Unspecified injury of head, initial encounter: Secondary | ICD-10-CM | POA: Diagnosis not present

## 2018-02-05 DIAGNOSIS — S299XXA Unspecified injury of thorax, initial encounter: Secondary | ICD-10-CM | POA: Diagnosis not present

## 2018-02-05 DIAGNOSIS — Z7401 Bed confinement status: Secondary | ICD-10-CM | POA: Diagnosis not present

## 2018-02-05 DIAGNOSIS — Z7982 Long term (current) use of aspirin: Secondary | ICD-10-CM | POA: Insufficient documentation

## 2018-02-05 DIAGNOSIS — Y92009 Unspecified place in unspecified non-institutional (private) residence as the place of occurrence of the external cause: Secondary | ICD-10-CM

## 2018-02-05 DIAGNOSIS — R531 Weakness: Secondary | ICD-10-CM | POA: Diagnosis not present

## 2018-02-05 DIAGNOSIS — M542 Cervicalgia: Secondary | ICD-10-CM | POA: Diagnosis not present

## 2018-02-05 DIAGNOSIS — I4510 Unspecified right bundle-branch block: Secondary | ICD-10-CM | POA: Diagnosis not present

## 2018-02-05 DIAGNOSIS — R51 Headache: Secondary | ICD-10-CM | POA: Diagnosis not present

## 2018-02-05 DIAGNOSIS — M2550 Pain in unspecified joint: Secondary | ICD-10-CM | POA: Diagnosis not present

## 2018-02-05 DIAGNOSIS — R52 Pain, unspecified: Secondary | ICD-10-CM | POA: Diagnosis not present

## 2018-02-05 DIAGNOSIS — S199XXA Unspecified injury of neck, initial encounter: Secondary | ICD-10-CM | POA: Diagnosis not present

## 2018-02-05 DIAGNOSIS — M1611 Unilateral primary osteoarthritis, right hip: Secondary | ICD-10-CM | POA: Diagnosis not present

## 2018-02-05 LAB — CBC WITH DIFFERENTIAL/PLATELET
Abs Immature Granulocytes: 0.12 10*3/uL — ABNORMAL HIGH (ref 0.00–0.07)
Basophils Absolute: 0 10*3/uL (ref 0.0–0.1)
Basophils Relative: 0 %
Eosinophils Absolute: 0 10*3/uL (ref 0.0–0.5)
Eosinophils Relative: 0 %
HCT: 34.7 % — ABNORMAL LOW (ref 36.0–46.0)
Hemoglobin: 11.5 g/dL — ABNORMAL LOW (ref 12.0–15.0)
Immature Granulocytes: 1 %
Lymphocytes Relative: 6 %
Lymphs Abs: 0.8 10*3/uL (ref 0.7–4.0)
MCH: 32.8 pg (ref 26.0–34.0)
MCHC: 33.1 g/dL (ref 30.0–36.0)
MCV: 98.9 fL (ref 80.0–100.0)
Monocytes Absolute: 0.3 10*3/uL (ref 0.1–1.0)
Monocytes Relative: 2 %
NEUTROS PCT: 91 %
Neutro Abs: 12 10*3/uL — ABNORMAL HIGH (ref 1.7–7.7)
PLATELETS: 320 10*3/uL (ref 150–400)
RBC: 3.51 MIL/uL — ABNORMAL LOW (ref 3.87–5.11)
RDW: 14.1 % (ref 11.5–15.5)
WBC: 13.2 10*3/uL — AB (ref 4.0–10.5)
nRBC: 0 % (ref 0.0–0.2)

## 2018-02-05 LAB — COMPREHENSIVE METABOLIC PANEL
ALT: 11 U/L (ref 0–44)
ANION GAP: 14 (ref 5–15)
AST: 21 U/L (ref 15–41)
Albumin: 3.3 g/dL — ABNORMAL LOW (ref 3.5–5.0)
Alkaline Phosphatase: 40 U/L (ref 38–126)
BUN: 18 mg/dL (ref 8–23)
CO2: 23 mmol/L (ref 22–32)
Calcium: 8.8 mg/dL — ABNORMAL LOW (ref 8.9–10.3)
Chloride: 102 mmol/L (ref 98–111)
Creatinine, Ser: 0.67 mg/dL (ref 0.44–1.00)
Glucose, Bld: 201 mg/dL — ABNORMAL HIGH (ref 70–99)
Potassium: 3.7 mmol/L (ref 3.5–5.1)
Sodium: 139 mmol/L (ref 135–145)
Total Bilirubin: 1 mg/dL (ref 0.3–1.2)
Total Protein: 6.5 g/dL (ref 6.5–8.1)

## 2018-02-05 LAB — URINALYSIS, ROUTINE W REFLEX MICROSCOPIC
Bilirubin Urine: NEGATIVE
Glucose, UA: NEGATIVE mg/dL
Hgb urine dipstick: NEGATIVE
Ketones, ur: 20 mg/dL — AB
Leukocytes, UA: NEGATIVE
Nitrite: NEGATIVE
Protein, ur: NEGATIVE mg/dL
Specific Gravity, Urine: 1.015 (ref 1.005–1.030)
pH: 8 (ref 5.0–8.0)

## 2018-02-05 LAB — CK: CK TOTAL: 14 U/L — AB (ref 38–234)

## 2018-02-05 MED ORDER — ACETAMINOPHEN 325 MG PO TABS
650.0000 mg | ORAL_TABLET | Freq: Once | ORAL | Status: AC
  Administered 2018-02-05: 650 mg via ORAL
  Filled 2018-02-05: qty 2

## 2018-02-05 MED ORDER — SODIUM CHLORIDE 0.9 % IV BOLUS
500.0000 mL | Freq: Once | INTRAVENOUS | Status: AC
  Administered 2018-02-05: 500 mL via INTRAVENOUS

## 2018-02-05 NOTE — ED Triage Notes (Signed)
Per EMS: Pt is coming from home with a complaint of unwitnessed fall. Pt is confused at baseline per caregiver. Pt last seen vertical yesterday at 4. Pt has a hx of chronic hip pain and complains of the same today. Pt given 50 of fentanyl by EMS.

## 2018-02-05 NOTE — ED Notes (Signed)
Pt is aware a urine sample is needed, but is unable to provide one at this time. Purewick in place.

## 2018-02-05 NOTE — ED Notes (Signed)
[facility] reports it will hopefully be less than an hour for them to transport patient

## 2018-02-05 NOTE — ED Provider Notes (Signed)
[facility] DEPT Provider Note   CSN: [id number] Arrival date & time: 02/05/18  1257     History   Chief Complaint Chief Complaint  Patient presents with  . Fall    HPI Melissa Dalton is a 84 y.o. female.  84 year old female presents for evaluation after a fall.  Patient was brought to the emergency room by EMS, caregiver at bedside.  Caregiver states that she arrived at the house at 11 AM today to find patient lying on the floor.  Patient lives alone, caregiver is present generally from 8 AM to 4 PM every day, caregiver states she left yesterday at [place] with patient in her electric recliner, arrived at [facility] today to find patient on the carpeted floor next to the recliner having had a bowel and bladder accident, unknown how long patient was on the floor. Patient states she fell out of the recliner sometime in the night, unable to say why or provide further history. Patient reports left hip pain which is chronic for her. Not on blood thinners, no other complaints. Caregiver states patient is confused, states she is confused at times, unsure if this is different for her.      Past Medical History:  Diagnosis Date  . Anxiety   . Arthritis   . Complication of anesthesia    DIFFICULT TO WAKE AFTER HERNIA OPERATION  . Depression   . Diabetes ([place])   . High cholesterol   . HTN (hypertension)   . PONV (postoperative nausea and vomiting)   . UTI (urinary tract infection) 06/18/2017    Patient Active Problem List   Diagnosis Date Noted  . MDD (major depressive disorder), recurrent severe, without psychosis ([place])   . Essential hypertension 06/18/2017  . GERD (gastroesophageal reflux disease) 06/18/2017  . Depression with anxiety 06/18/2017  . Sepsis ([place]) 06/17/2017  . UTI (urinary tract infection) 06/17/2017  . AKI (acute kidney injury) ([place]) 06/17/2017  . Acute metabolic encephalopathy 85/03/7739  . Weakness 04/24/2017  . Diabetes mellitus without  complication ([place]) 28/78/6767  . CAP (community acquired pneumonia) 04/24/2017  . HLD (hyperlipidemia) 04/24/2017  . Gout 04/24/2017  . Coronary artery calcification 12/20/2012  . Multiple lung nodules 10/25/2012  . Chronic cough 10/25/2012    Past Surgical History:  Procedure Laterality Date  . HERNIA REPAIR    . TOTAL ABDOMINAL HYSTERECTOMY    . TUMOR REMOVAL       OB History   No obstetric history on file.      Home Medications    Prior to Admission medications   Medication Sig Start Date End Date Taking? Authorizing Provider  acetaminophen (TYLENOL) 500 MG tablet Take 500 mg by mouth every 6 (six) hours as needed (for pain or headaches).   Yes [provider]  aspirin 325 MG EC tablet Take 325 mg by mouth daily.   Yes [provider]  Cholecalciferol (VITAMIN D-3) 1000 units CAPS Take 1,000 Units by mouth daily.   Yes [provider]  DULoxetine (CYMBALTA) 20 MG capsule Take 2 capsules (40 mg total) by mouth daily. 08/04/17  Yes Amin, Jeanella Flattery, MD  famotidine (PEPCID) 40 MG tablet Take 40 mg by mouth 2 (two) times daily.   Yes [provider]  gabapentin (NEURONTIN) 300 MG capsule Take 300 mg by mouth daily.  03/18/17  Yes [provider]  loratadine (ALLERGY) 10 MG tablet Take 10 mg by mouth daily.   Yes [provider]  losartan (COZAAR) 50  MG tablet Take 50 mg by mouth daily. 06/17/17  Yes [provider]  Melatonin 5 MG TABS Take 1 tablet by mouth at bedtime.    Yes [provider]  metFORMIN (GLUCOPHAGE) 1000 MG tablet Take 1,000 mg by mouth daily with breakfast.  02/10/17  Yes [provider]  Multiple Vitamins-Minerals (MULTIVITAMIN WITH MINERALS) tablet Take 1 tablet by mouth daily.   Yes [provider]  fexofenadine (ALLEGRA) 180 MG tablet Take 180 mg by mouth daily.     [provider]  HYDROcodone-acetaminophen (NORCO/VICODIN) 5-325 MG tablet Take 1 tablet by mouth  every 6 (six) hours as needed for moderate pain or severe pain. 01/15/18   Domenic Moras, PA-C  Pumpkin Seed-Soy Germ (AZO BLADDER CONTROL/GO-LESS) CAPS Take 2 capsules by mouth at bedtime as needed (when bladder issues arise).     [provider]    Family History Family History  Problem Relation Age of Onset  . Heart disease Father   . Diabetes Mother     Social History Social History   Tobacco Use  . Smoking status: Never Smoker  . Smokeless tobacco: Never Used  Substance Use Topics  . Alcohol use: No  . Drug use: No     Allergies   Oxycodone; Penicillins; and Tramadol   Review of Systems Review of Systems  Unable to perform ROS: Mental status change  Constitutional: Negative for fever.  Respiratory: Negative for shortness of breath.   Cardiovascular: Negative for chest pain.  Gastrointestinal: Negative for abdominal pain, constipation, diarrhea, nausea and vomiting.  Musculoskeletal: Positive for arthralgias. Negative for back pain and neck pain.  Skin: Negative for wound.  Allergic/Immunologic: Positive for immunocompromised state.  Hematological: Does not bruise/bleed easily.  Psychiatric/Behavioral: Positive for confusion.     Physical Exam Updated Vital Signs BP (!) 133/105 (BP Location: Right Arm) Comment: Pt can't hold still and is shaking and hollering.  Pulse 67   Temp 99.2 F (37.3 C) (Oral)   Resp 18   SpO2 95%   Physical Exam Vitals signs and nursing note reviewed.  Constitutional:      General: She is not in acute distress.    Appearance: She is obese. She is not ill-appearing.  HENT:     Head: Normocephalic and atraumatic.      Nose: Nose normal.     Mouth/Throat:     Mouth: Mucous membranes are dry.  Eyes:     Extraocular Movements: Extraocular movements intact.     Pupils: Pupils are equal, round, and reactive to light.  Neck:     Musculoskeletal: No muscular tenderness.  Cardiovascular:     Rate and Rhythm: Normal rate and  regular rhythm.     Pulses: Normal pulses.     Heart sounds: Normal heart sounds.  Pulmonary:     Effort: Pulmonary effort is normal.     Breath sounds: Normal breath sounds.  Abdominal:     Tenderness: There is no abdominal tenderness.  Musculoskeletal:        General: Tenderness present.     Right shoulder: Normal.     Left shoulder: Normal.     Right hip: She exhibits tenderness.     Left hip: She exhibits tenderness.     Right lower leg: No edema.     Left lower leg: No edema.     Comments: Pain with rotation of hips, hips do not appear foreshortened or rotated. No tenderness with palpation/movement of upper extremities or lower legs.  Skin:    General: Skin is warm and dry.  Neurological:     Mental Status: She is alert. Mental status is at baseline. She is confused.     GCS: GCS eye subscore is 4. GCS verbal subscore is 4. GCS motor subscore is 6.     Cranial Nerves: No facial asymmetry.     Sensory: Sensation is intact.     Motor: No weakness.     Comments: Awake, unable to answer questions regarding recent events, aware she fell at night      ED Treatments / Results  Labs (all labs ordered are listed, but only abnormal results are displayed) Labs Reviewed  COMPREHENSIVE METABOLIC PANEL - Abnormal; Notable for the following components:      Result Value   Glucose, Bld 201 (*)    Calcium 8.8 (*)    Albumin 3.3 (*)    All other components within normal limits  CBC WITH DIFFERENTIAL/PLATELET - Abnormal; Notable for the following components:   WBC 13.2 (*)    RBC 3.51 (*)    Hemoglobin 11.5 (*)    HCT 34.7 (*)    Neutro Abs 12.0 (*)    Abs Immature Granulocytes 0.12 (*)    All other components within normal limits  CK - Abnormal; Notable for the following components:   Total CK 14 (*)    All other components within normal limits  URINALYSIS, ROUTINE W REFLEX MICROSCOPIC - Abnormal; Notable for the following components:   Ketones, ur 20 (*)    All other  components within normal limits    EKG EKG Interpretation  Date/Time:  Thursday February 05 2018 13:16:22 EST Ventricular Rate:  78 PR Interval:    QRS Duration: 146 QT Interval:  396 QTC Calculation: 452 R Axis:   -36 Text Interpretation:  Unknown rhythm, irregular rate Right bundle branch block since last tracing no significant change Confirmed by Daleen Bo [phone]) on 02/05/2018 5:03:19 PM   Radiology Ct Head Wo Contrast  Result Date: 02/05/2018 CLINICAL DATA:  Severe pain.  Unwitnessed fall EXAM: CT HEAD WITHOUT CONTRAST CT CERVICAL SPINE WITHOUT CONTRAST TECHNIQUE: Multidetector CT imaging of the head and cervical spine was performed following the standard protocol without intravenous contrast. Multiplanar CT image reconstructions of the cervical spine were also generated. COMPARISON:  None. FINDINGS: CT HEAD FINDINGS Brain: No evidence of acute infarction, hemorrhage, extra-axial collection, ventriculomegaly, or mass effect. Generalized cerebral atrophy. Periventricular white matter low attenuation likely secondary to microangiopathy. Vascular: Cerebrovascular atherosclerotic calcifications are noted. Skull: Negative for fracture or focal lesion. Sinuses/Orbits: Visualized portions of the orbits are unremarkable. Visualized portions of the paranasal sinuses and mastoid air cells are unremarkable. Other: None. CT CERVICAL SPINE FINDINGS Patient motion degrades image quality. Alignment: Normal. Skull base and vertebrae: C7 vertebral body anterior height loss most consistent with a mild compression fracture unchanged compared with CT chest dated 10/07/2013. No acute fracture. No primary bone lesion or focal pathologic process. Soft tissues and spinal canal: No prevertebral fluid or swelling. No visible canal hematoma. Disc levels: Degenerative disc disease with mild disc height loss at T1-2 and T2-3. Upper chest: Lung apices are clear. Other: No fluid collection or hematoma. IMPRESSION: 1. No  acute intracranial pathology. 2.  No acute osseous injury of the cervical spine. Electronically Signed   By: Kathreen Devoid   On: 02/05/2018 15:12   Ct Cervical Spine Wo Contrast  Result Date: 02/05/2018 CLINICAL DATA:  Severe pain.  Unwitnessed fall EXAM: CT HEAD  WITHOUT CONTRAST CT CERVICAL SPINE WITHOUT CONTRAST TECHNIQUE: Multidetector CT imaging of the head and cervical spine was performed following the standard protocol without intravenous contrast. Multiplanar CT image reconstructions of the cervical spine were also generated. COMPARISON:  None. FINDINGS: CT HEAD FINDINGS Brain: No evidence of acute infarction, hemorrhage, extra-axial collection, ventriculomegaly, or mass effect. Generalized cerebral atrophy. Periventricular white matter low attenuation likely secondary to microangiopathy. Vascular: Cerebrovascular atherosclerotic calcifications are noted. Skull: Negative for fracture or focal lesion. Sinuses/Orbits: Visualized portions of the orbits are unremarkable. Visualized portions of the paranasal sinuses and mastoid air cells are unremarkable. Other: None. CT CERVICAL SPINE FINDINGS Patient motion degrades image quality. Alignment: Normal. Skull base and vertebrae: C7 vertebral body anterior height loss most consistent with a mild compression fracture unchanged compared with CT chest dated 10/07/2013. No acute fracture. No primary bone lesion or focal pathologic process. Soft tissues and spinal canal: No prevertebral fluid or swelling. No visible canal hematoma. Disc levels: Degenerative disc disease with mild disc height loss at T1-2 and T2-3. Upper chest: Lung apices are clear. Other: No fluid collection or hematoma. IMPRESSION: 1. No acute intracranial pathology. 2.  No acute osseous injury of the cervical spine. Electronically Signed   By: Kathreen Devoid   On: 02/05/2018 15:12   Dg Chest Port 1 View  Result Date: 02/05/2018 CLINICAL DATA:  Unwitnessed fall. EXAM: PORTABLE CHEST 1 VIEW  COMPARISON:  08/02/2017 FINDINGS: The heart size and mediastinal contours are within normal limits. Both lungs are clear. No acute bone abnormality. Chronic degenerative changes of both shoulders consistent with chronic rotator cuff tears. IMPRESSION: No active disease. Electronically Signed   By: Lorriane Shire M.D.   On: 02/05/2018 15:15   Dg Hip Unilat With Pelvis 2-3 Views Left  Result Date: 02/05/2018 CLINICAL DATA:  84 year old who fell and injured the LEFT hip. Initial encounter. EXAM: DG HIP (WITH OR WITHOUT PELVIS) 2-3V LEFT COMPARISON:  01/15/2018. FINDINGS: The cross-table lateral image of the hip is uninterpretable. No evidence of acute fracture or dislocation. Complete loss of the joint space in the LEFT hip. Bone mineral density well preserved for patient age. Included AP pelvis demonstrates moderate to severe joint space narrowing involving the contralateral RIGHT hip (not as severe as on the LEFT). Symphysis pubis intact without significant degenerative changes. Sacroiliac joints intact with mild degenerative changes. Degenerative changes involving the visualized LOWER lumbar spine. IMPRESSION: 1. No acute osseous abnormality. 2. Severe osteoarthritis involving the LEFT hip. 3. Moderate to severe osteoarthritis involving the contralateral RIGHT hip. Electronically Signed   By: Evangeline Dakin M.D.   On: 02/05/2018 13:57    Procedures Procedures (including critical care time)  Medications Ordered in ED Medications  sodium chloride 0.9 % bolus 500 mL (0 mLs Intravenous Stopped 02/05/18 1827)  acetaminophen (TYLENOL) tablet 650 mg (650 mg Oral Given 02/05/18 1825)     Initial Impression / Assessment and Plan / ED Course  I have reviewed the triage vital signs and the nursing notes.  Pertinent labs & imaging results that were available during my care of the patient were reviewed by me and considered in my medical decision making (see chart for details).  Clinical Course as of Feb 06 1856  Thu Feb 05, 2546  2781 84 year old female brought in by EMS from home for a fall today.  CT head, C-spine without acute findings, chest x-ray normal, lab work including urinalysis, CK, CBC and CMP without significant findings.  Patient is alert and oriented, caregiver  and daughter at bedside.  Patient refuses nursing facility placement.  Patient pays for in-home care from 8 AM until 4 PM daily, refuses to wear a fall alert.  Family has had this discussion with her PCP on multiple occasions and patient is not ready to make a change.  Patient does alert and able to make decisions at this time.  Patient will be discharged home with family and caretaker, plan is to have case management follow-up with family tomorrow.   [LM]    Clinical Course User Index [LM] Tacy Learn, PA-C   Final Clinical Impressions(s) / ED Diagnoses   Final diagnoses:  Fall in home, initial encounter  Primary osteoarthritis of both hips    ED Discharge Orders    None       Roque Lias 02/05/18 Baruch Merl, MD 02/07/18 1010

## 2018-02-05 NOTE — ED Notes (Signed)
Pt placed on purewick catheter. Pericare was performed prior to placement.

## 2018-02-05 NOTE — ED Notes (Signed)
Pt transported to CT ?

## 2018-02-05 NOTE — Discharge Instructions (Addendum)
Work on plan for further home care. Hospital follow up with case management. Consider fall alert system.

## 2018-02-05 NOTE — ED Notes (Signed)
Bed: WA02 Expected date:  Expected time:  Means of arrival:  Comments: EMS-fall 

## 2018-02-05 NOTE — ED Notes (Signed)
[facility] called for transport.  

## 2018-02-16 ENCOUNTER — Ambulatory Visit: Payer: Self-pay | Admitting: Licensed Clinical Social Worker

## 2018-02-22 DIAGNOSIS — Z9181 History of falling: Secondary | ICD-10-CM | POA: Diagnosis not present

## 2018-02-22 DIAGNOSIS — M109 Gout, unspecified: Secondary | ICD-10-CM | POA: Diagnosis not present

## 2018-02-22 DIAGNOSIS — M6281 Muscle weakness (generalized): Secondary | ICD-10-CM | POA: Diagnosis not present

## 2018-02-22 DIAGNOSIS — R278 Other lack of coordination: Secondary | ICD-10-CM | POA: Diagnosis not present

## 2018-02-22 DIAGNOSIS — M1990 Unspecified osteoarthritis, unspecified site: Secondary | ICD-10-CM | POA: Diagnosis not present

## 2018-02-25 ENCOUNTER — Other Ambulatory Visit: Payer: Self-pay

## 2018-02-25 NOTE — Patient Outreach (Signed)
[facility]) Care Management  02/25/2018  Kambry Takacs [facility] [birth year] [id number]   Telephone Screen  Referral Date: 02/25/2018 Referral Source: Nurse Call Center Referral Reason: " 02/24/2018-10:42am-caller states her mother has swelling in legs for 2wks and has blisters on legs,painful, having trouble standing" Insurance: H[employer]   Outreach attempt # 1 to patient. Spoke with patient. Discussed call to 24 hr Nurse Line on yesterday. Patient reports that her daughter took nurse advice and called and made MD appt. Patient states she thinks her appt is next week. She voices that her legs "seem a little better" today and she is currently not having any pain. Patient voices that she has blisters to area and yesterday legs felt slightly warm. RN CM discussed possible infection, cellulitis or other issues may be going on and strongly encouraged patient to seek medical attention sooner than next week. Patient declined and was adamant that she was fine and did not need to be seen any sooner. She declined needing any further RN CM assistance. Patient advised to feel free to call 24 hr Nurse Line for any future needs or concerns. They voiced understanding       Plan: RN CM will close referral. Patient remains active with [facility] SW.  Enzo Montgomery, RN,BSN,CCM [place] Management Telephonic Care Management Coordinator Direct Phone: [phone] Toll Free: [phone] Fax: [phone]

## 2018-02-26 ENCOUNTER — Ambulatory Visit: Payer: Self-pay | Admitting: *Deleted

## 2018-03-25 DIAGNOSIS — M1990 Unspecified osteoarthritis, unspecified site: Secondary | ICD-10-CM | POA: Diagnosis not present

## 2018-03-25 DIAGNOSIS — M109 Gout, unspecified: Secondary | ICD-10-CM | POA: Diagnosis not present

## 2018-03-25 DIAGNOSIS — R278 Other lack of coordination: Secondary | ICD-10-CM | POA: Diagnosis not present

## 2018-03-25 DIAGNOSIS — Z9181 History of falling: Secondary | ICD-10-CM | POA: Diagnosis not present

## 2018-03-25 DIAGNOSIS — M6281 Muscle weakness (generalized): Secondary | ICD-10-CM | POA: Diagnosis not present

## 2018-04-23 DIAGNOSIS — Z9181 History of falling: Secondary | ICD-10-CM | POA: Diagnosis not present

## 2018-04-23 DIAGNOSIS — M6281 Muscle weakness (generalized): Secondary | ICD-10-CM | POA: Diagnosis not present

## 2018-04-23 DIAGNOSIS — M1990 Unspecified osteoarthritis, unspecified site: Secondary | ICD-10-CM | POA: Diagnosis not present

## 2018-04-23 DIAGNOSIS — R278 Other lack of coordination: Secondary | ICD-10-CM | POA: Diagnosis not present

## 2018-05-24 DIAGNOSIS — M1990 Unspecified osteoarthritis, unspecified site: Secondary | ICD-10-CM | POA: Diagnosis not present

## 2018-05-24 DIAGNOSIS — M6281 Muscle weakness (generalized): Secondary | ICD-10-CM | POA: Diagnosis not present

## 2018-05-24 DIAGNOSIS — R278 Other lack of coordination: Secondary | ICD-10-CM | POA: Diagnosis not present

## 2018-05-24 DIAGNOSIS — Z9181 History of falling: Secondary | ICD-10-CM | POA: Diagnosis not present

## 2018-06-12 DIAGNOSIS — M8949 Other hypertrophic osteoarthropathy, multiple sites: Secondary | ICD-10-CM | POA: Diagnosis not present

## 2018-06-12 DIAGNOSIS — Z515 Encounter for palliative care: Secondary | ICD-10-CM | POA: Diagnosis not present

## 2018-06-12 DIAGNOSIS — E118 Type 2 diabetes mellitus with unspecified complications: Secondary | ICD-10-CM | POA: Diagnosis not present

## 2018-06-12 DIAGNOSIS — I10 Essential (primary) hypertension: Secondary | ICD-10-CM | POA: Diagnosis not present

## 2018-06-12 DIAGNOSIS — E782 Mixed hyperlipidemia: Secondary | ICD-10-CM | POA: Diagnosis not present

## 2018-06-12 DIAGNOSIS — R54 Age-related physical debility: Secondary | ICD-10-CM | POA: Diagnosis not present

## 2018-06-18 ENCOUNTER — Other Ambulatory Visit: Payer: Self-pay | Admitting: Licensed Clinical Social Worker

## 2018-08-05 DEATH — deceased

## 2020-05-09 IMAGING — CT CT HEAD W/O CM
4 series · 17 of 47 positions shown, 19 images · non-contrast
Comparison: MRI brain 04/24/2017, CT brain 04/23/2017

CLINICAL DATA: Altered LOC generalize weakness

EXAM:
CT HEAD WITHOUT CONTRAST
TECHNIQUE: Contiguous axial images were obtained from the base of the skull
through the vertex without intravenous contrast.

[Series 3: head wo · axial · 0.43mm/px · z∈[-128,-8]mm · 7 of 34 slices shown, 9 images]
[im 5/34  brain]
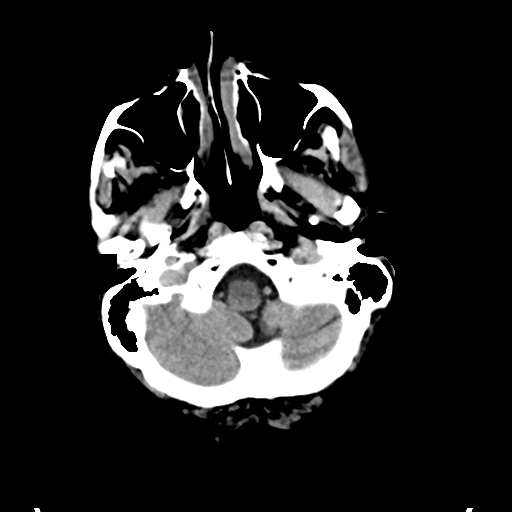
[im 5/34  bone]
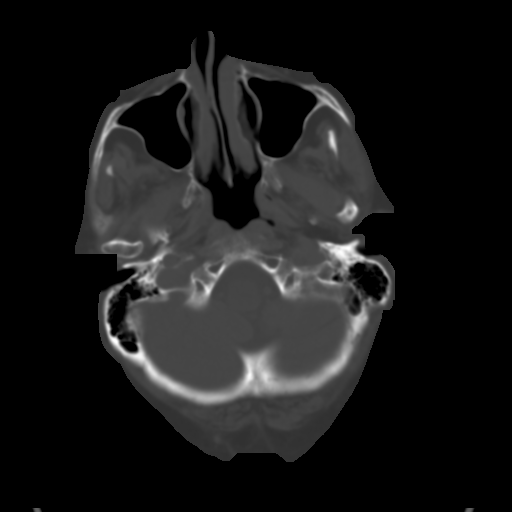
[im 9/34  brain]
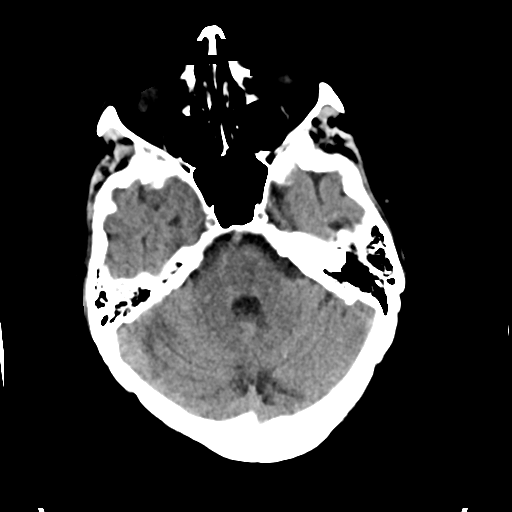
[im 13/34  brain]
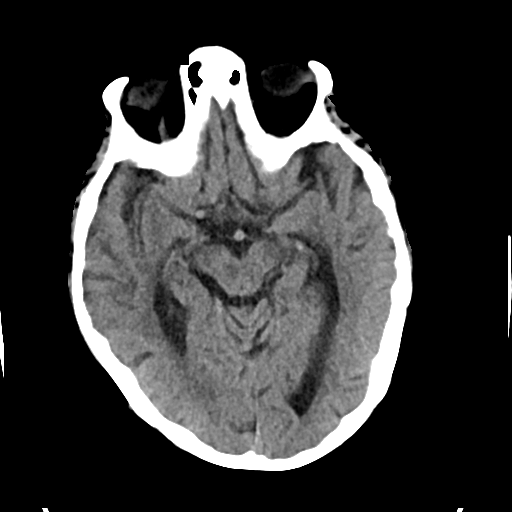
[im 17/34  brain]
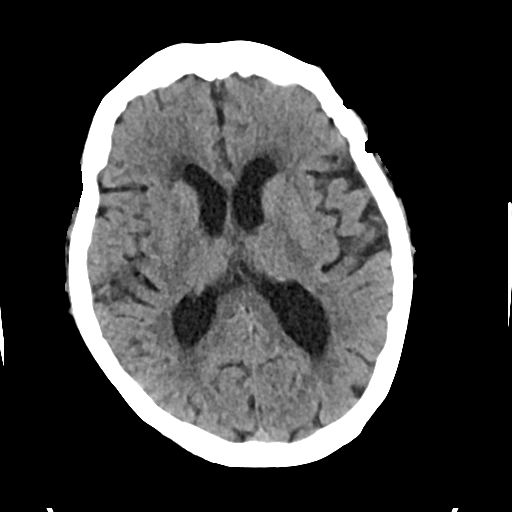
[im 21/34  brain]
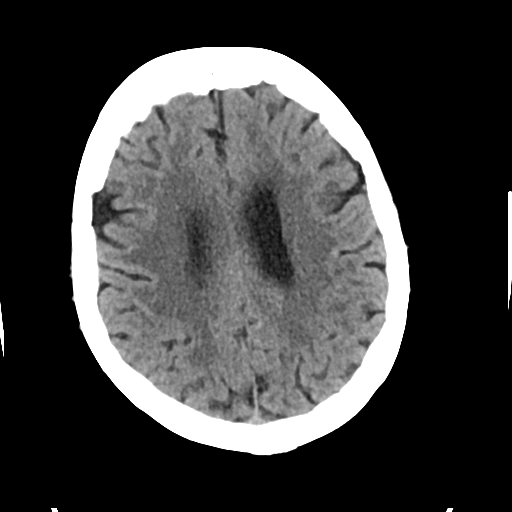
[im 21/34  bone]
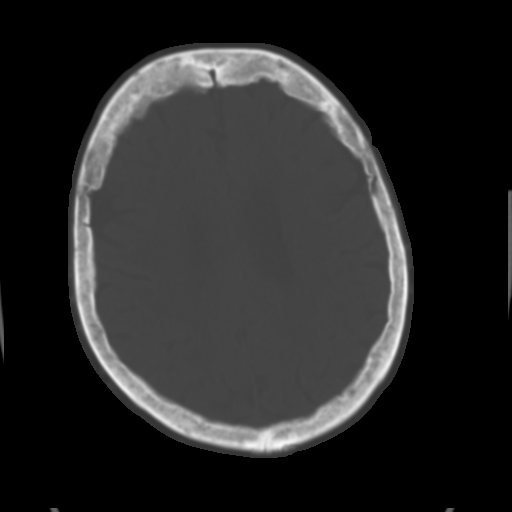
[im 25/34  brain]
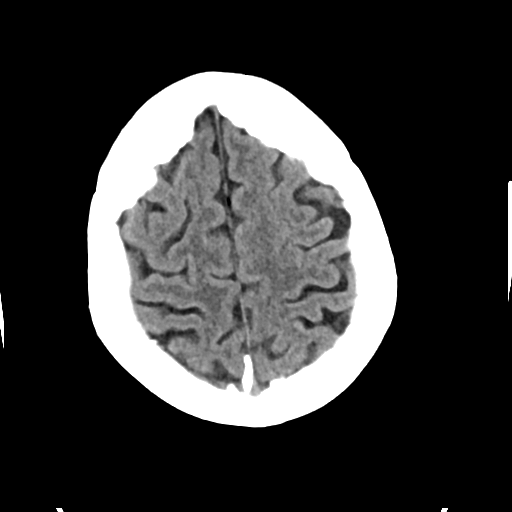
[im 29/34  brain]
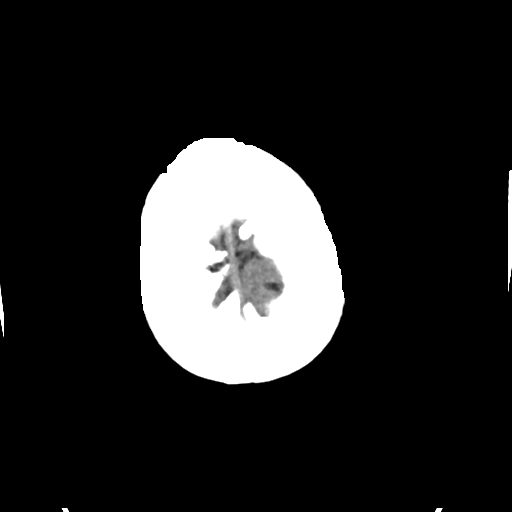

[Series 4: head bone · axial · 0.43mm/px · z∈[-132,-74]mm · 4 of 84 slices shown]
[im 9/84  bone]
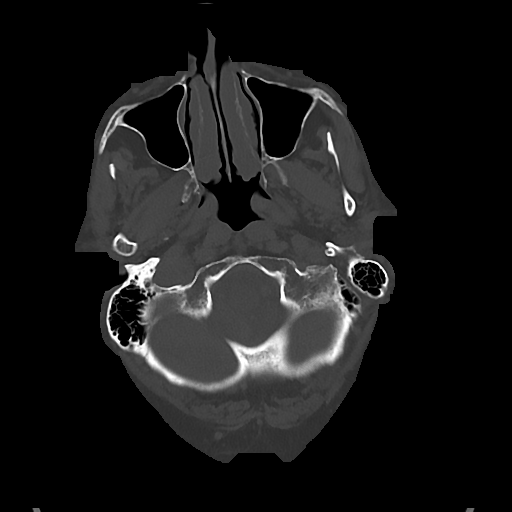
[im 17/84  bone]
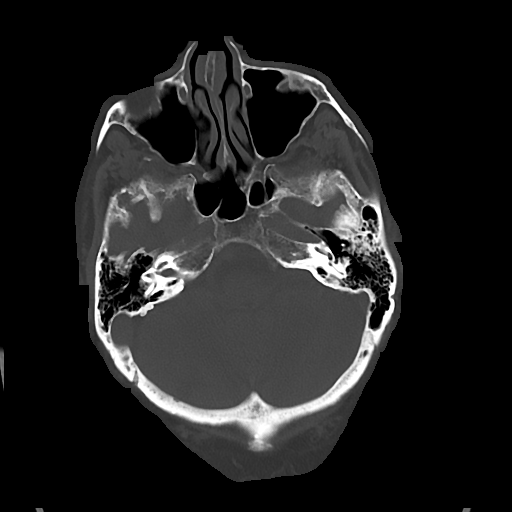
[im 25/84  bone]
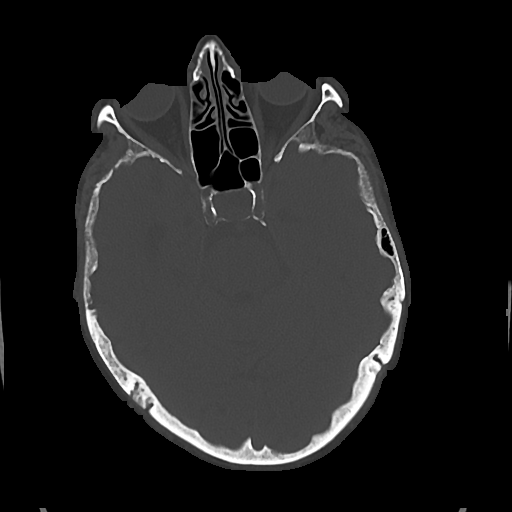
[im 38/84  bone]
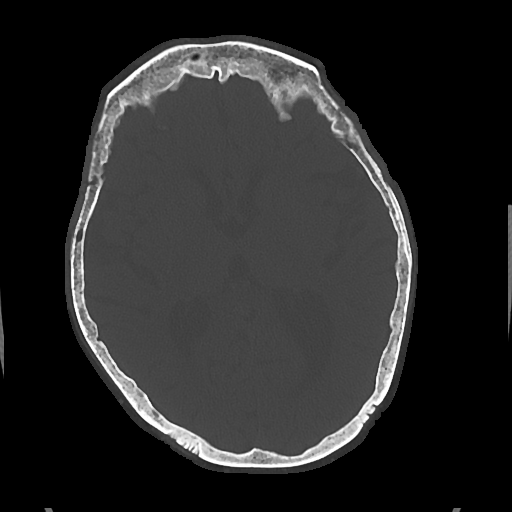

[Series 5: cor soft · coronal · 0.32mm/px · 3 of 71 slices shown]
[im 24/71  brain]
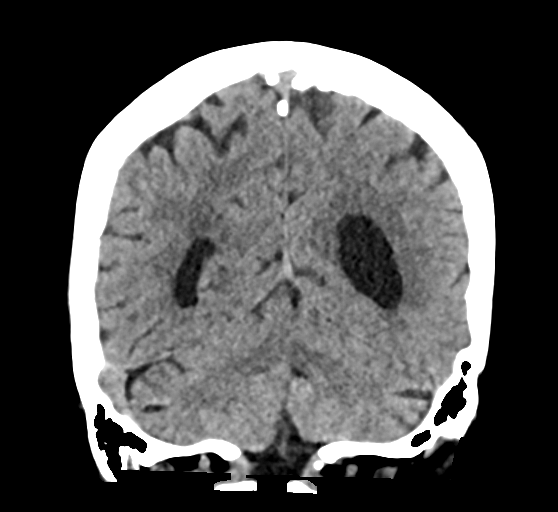
[im 32/71  brain]
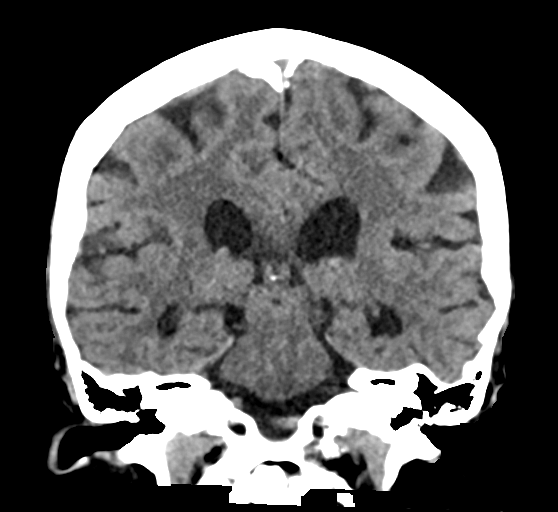
[im 39/71  brain]
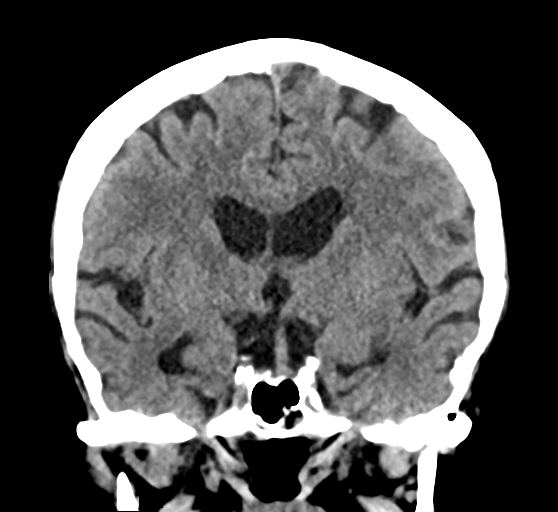

[Series 6: sag soft · sagittal · 0.32mm/px · 3 of 60 slices shown]
[im 20/60  brain]
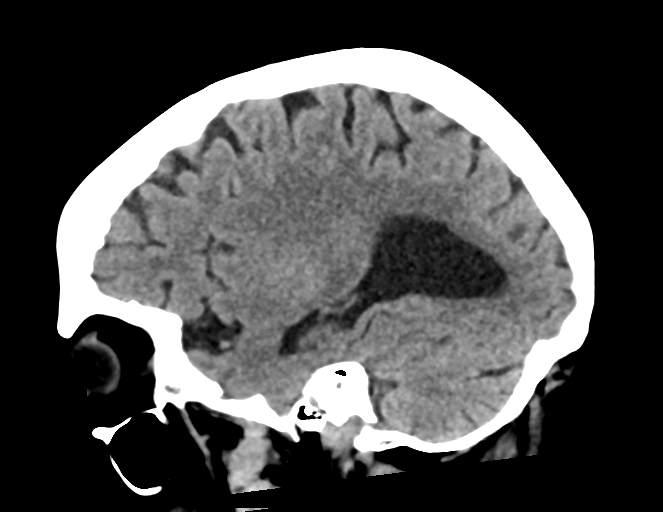
[im 30/60  brain]
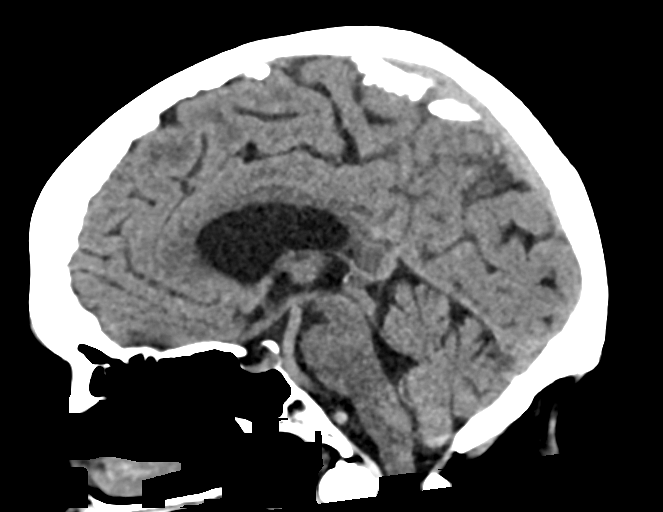
[im 40/60  brain]
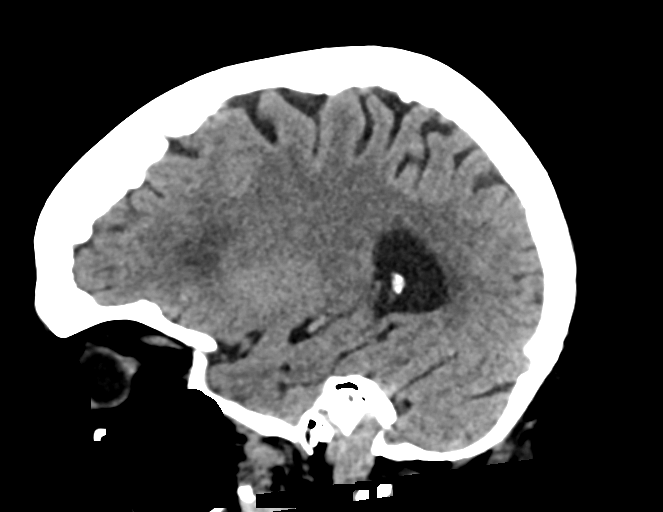

[17 of 47 positions shown; findings below may reference images not displayed]

FINDINGS: Brain: No acute territorial infarction, hemorrhage or intracranial
mass is visualized. Atrophy with small vessel ischemic changes of
the white matter. Stable ventricle size.

Vascular: No hyperdense vessels.  Carotid vascular calcification

Skull: Normal. Negative for fracture or focal lesion.

Sinuses/Orbits: No acute finding.

Other: None
IMPRESSION: 1. No CT evidence for acute intracranial abnormality.
2. Atrophy with small vessel ischemic changes of the white matter
